# Patient Record
Sex: Female | Born: 1955 | Race: White | Hispanic: No | Smoking: Current every day smoker
Health system: Southern US, Community
[De-identification: ages and names within clinical notes are randomized; demographics above are authoritative.]

## PROBLEM LIST (undated history)

## (undated) DIAGNOSIS — F329 Major depressive disorder, single episode, unspecified: Secondary | ICD-10-CM

## (undated) DIAGNOSIS — B1920 Unspecified viral hepatitis C without hepatic coma: Secondary | ICD-10-CM

## (undated) DIAGNOSIS — F32A Depression, unspecified: Secondary | ICD-10-CM

## (undated) DIAGNOSIS — C55 Malignant neoplasm of uterus, part unspecified: Secondary | ICD-10-CM

## (undated) DIAGNOSIS — T83729A Exposure of other prosthetic materials into organ or tissue, initial encounter: Secondary | ICD-10-CM

## (undated) DIAGNOSIS — I714 Abdominal aortic aneurysm, without rupture: Secondary | ICD-10-CM

## (undated) DIAGNOSIS — F419 Anxiety disorder, unspecified: Secondary | ICD-10-CM

## (undated) DIAGNOSIS — M810 Age-related osteoporosis without current pathological fracture: Secondary | ICD-10-CM

## (undated) DIAGNOSIS — K219 Gastro-esophageal reflux disease without esophagitis: Secondary | ICD-10-CM

## (undated) DIAGNOSIS — J449 Chronic obstructive pulmonary disease, unspecified: Secondary | ICD-10-CM

## (undated) DIAGNOSIS — Z923 Personal history of irradiation: Secondary | ICD-10-CM

## (undated) DIAGNOSIS — I10 Essential (primary) hypertension: Secondary | ICD-10-CM

## (undated) DIAGNOSIS — E559 Vitamin D deficiency, unspecified: Secondary | ICD-10-CM

## (undated) DIAGNOSIS — T83721A Exposure of implanted vaginal mesh and other prosthetic materials into vagina, initial encounter: Secondary | ICD-10-CM

## (undated) DIAGNOSIS — F17200 Nicotine dependence, unspecified, uncomplicated: Secondary | ICD-10-CM

## (undated) DIAGNOSIS — C3490 Malignant neoplasm of unspecified part of unspecified bronchus or lung: Secondary | ICD-10-CM

## (undated) HISTORY — DX: Unspecified viral hepatitis C without hepatic coma: B19.20

## (undated) HISTORY — PX: NEUROPLASTY / TRANSPOSITION MEDIAN NERVE AT CARPAL TUNNEL: SUR893

## (undated) HISTORY — DX: Nicotine dependence, unspecified, uncomplicated: F17.200

## (undated) HISTORY — DX: Essential (primary) hypertension: I10

## (undated) HISTORY — DX: Malignant neoplasm of uterus, part unspecified: C55

## (undated) HISTORY — DX: Depression, unspecified: F32.A

## (undated) HISTORY — DX: Anxiety disorder, unspecified: F41.9

## (undated) HISTORY — PX: TONSILLECTOMY: SUR1361

## (undated) HISTORY — DX: Vitamin D deficiency, unspecified: E55.9

## (undated) HISTORY — DX: Exposure of implanted vaginal mesh into vagina, initial encounter: T83.721A

## (undated) HISTORY — DX: Major depressive disorder, single episode, unspecified: F32.9

## (undated) HISTORY — DX: Gastro-esophageal reflux disease without esophagitis: K21.9

## (undated) HISTORY — DX: Personal history of irradiation: Z92.3

## (undated) HISTORY — DX: Chronic obstructive pulmonary disease, unspecified: J44.9

## (undated) HISTORY — PX: CHOLECYSTECTOMY: SHX55

## (undated) HISTORY — PX: ABDOMINAL HYSTERECTOMY: SHX81

## (undated) HISTORY — DX: Age-related osteoporosis without current pathological fracture: M81.0

## (undated) HISTORY — PX: TUMOR REMOVAL: SHX12

## (undated) HISTORY — PX: TUBAL LIGATION: SHX77

## (undated) HISTORY — DX: Exposure of other prosthetic materials into organ or tissue, initial encounter: T83.729A

## (undated) HISTORY — PX: RECONSTRUCTION OF NOSE: SHX2301

## (undated) HISTORY — PX: BLADDER SURGERY: SHX569

---

## 1999-03-10 ENCOUNTER — Emergency Department (HOSPITAL_COMMUNITY): Admission: EM | Admit: 1999-03-10 | Discharge: 1999-03-10 | Payer: Self-pay | Admitting: Emergency Medicine

## 2005-03-08 ENCOUNTER — Emergency Department: Payer: Self-pay | Admitting: Unknown Physician Specialty

## 2006-06-05 ENCOUNTER — Ambulatory Visit: Payer: Self-pay | Admitting: Gastroenterology

## 2007-04-21 ENCOUNTER — Emergency Department (HOSPITAL_COMMUNITY): Admission: EM | Admit: 2007-04-21 | Discharge: 2007-04-21 | Payer: Self-pay | Admitting: Emergency Medicine

## 2007-05-17 ENCOUNTER — Emergency Department (HOSPITAL_COMMUNITY): Admission: EM | Admit: 2007-05-17 | Discharge: 2007-05-17 | Payer: Self-pay | Admitting: Emergency Medicine

## 2007-09-16 ENCOUNTER — Encounter (HOSPITAL_COMMUNITY): Admission: RE | Admit: 2007-09-16 | Discharge: 2007-10-21 | Payer: Self-pay | Admitting: Nephrology

## 2007-10-04 ENCOUNTER — Emergency Department (HOSPITAL_COMMUNITY): Admission: EM | Admit: 2007-10-04 | Discharge: 2007-10-04 | Payer: Self-pay | Admitting: Emergency Medicine

## 2008-02-06 ENCOUNTER — Encounter: Admission: RE | Admit: 2008-02-06 | Discharge: 2008-02-06 | Payer: Self-pay | Admitting: Nephrology

## 2008-04-06 ENCOUNTER — Ambulatory Visit (HOSPITAL_COMMUNITY): Admission: RE | Admit: 2008-04-06 | Discharge: 2008-04-07 | Payer: Self-pay | Admitting: Obstetrics and Gynecology

## 2008-07-16 ENCOUNTER — Emergency Department (HOSPITAL_COMMUNITY): Admission: EM | Admit: 2008-07-16 | Discharge: 2008-07-16 | Payer: Self-pay | Admitting: Emergency Medicine

## 2009-04-15 ENCOUNTER — Ambulatory Visit: Payer: Self-pay | Admitting: Vascular Surgery

## 2009-04-15 ENCOUNTER — Encounter (INDEPENDENT_AMBULATORY_CARE_PROVIDER_SITE_OTHER): Payer: Self-pay | Admitting: Nephrology

## 2009-04-15 ENCOUNTER — Ambulatory Visit (HOSPITAL_COMMUNITY): Admission: RE | Admit: 2009-04-15 | Discharge: 2009-04-15 | Payer: Self-pay | Admitting: Nephrology

## 2009-06-18 ENCOUNTER — Inpatient Hospital Stay (HOSPITAL_COMMUNITY): Admission: EM | Admit: 2009-06-18 | Discharge: 2009-06-19 | Payer: Self-pay | Admitting: Emergency Medicine

## 2010-01-15 ENCOUNTER — Observation Stay (HOSPITAL_COMMUNITY)
Admission: EM | Admit: 2010-01-15 | Discharge: 2010-01-16 | Payer: Self-pay | Source: Home / Self Care | Admitting: Emergency Medicine

## 2010-01-16 ENCOUNTER — Encounter (INDEPENDENT_AMBULATORY_CARE_PROVIDER_SITE_OTHER): Payer: Self-pay

## 2010-03-12 ENCOUNTER — Encounter: Payer: Self-pay | Admitting: Nephrology

## 2010-03-18 NOTE — Discharge Summary (Signed)
  NAMEROANNE, HAYE               ACCOUNT NO.:  [id number]  MEDICAL RECORD NO.:  [id number]          PATIENT TYPE:  INP  LOCATION:  5114                         FACILITY:  [facility]  PHYSICIAN:  Cherylynn Ridges, M.D.    DATE OF BIRTH:  [birth year]  DATE OF ADMISSION:  01/15/2010 DATE OF DISCHARGE:  01/16/2010                              DISCHARGE SUMMARY   DISCHARGE DIAGNOSES: 1. Acute cholecystitis. 2. Chronic back pain. 3. Chronic obstructive pulmonary disease. 4. Hypertension 5. Anxiety. 6. Hyperlipidemia. 7. Gastroesophageal reflux disease.  PAST SURGERIES: 1. Bladder prolapse repair. 2. Carpal tunnel release.  PROCEDURES DURING THIS ADMISSION:  Laparoscopic cholecystectomy with cholangiogram per Dr. Lindie Spruce on January 16, 2010.  HISTORY:  This is a 55 year old female, who had progressive abdominal pain over the past couple of weeks.  Her pain worsened and she was evaluated in the emergency department where an ultrasound revealed several gallstones within the gallbladder.  She had a positive Murphy sign, and continued abdominal pain as well as some minimally elevated liver functions/transaminases.  The patient was started on IV Invanz in the emergency department, and admitted for anticipated cholecystectomy. The patient was taken to the OR later the same day for laparoscopic cholecystectomy with cholangiogram per Dr. Lindie Spruce with findings of an acutely inflamed and edematous gallbladder, but normal intraoperative cholangiogram.  She did well postoperatively and apparently was discharged home.  Please note that I was not involved in this patient's care and had merely been asked to dictate the summary.  She resumed her usual home medications, and I do not know what prescription she was given for pain control postoperatively.  She will follow up in the [facility] in a couple of weeks after discharge.    Lazaro Arms,  P.A.   ______________________________ Cherylynn Ridges, M.D.   SR/MEDQ  D:  03/01/2010  T:  03/02/2010  Job:  [id number]  Electronically Signed by Lazaro Arms P.A. on 03/02/2010 02:22:01 PM Electronically Signed by Jimmye Norman M.D. on 03/16/2010 08:00:13 AM

## 2010-05-03 LAB — DIFFERENTIAL
Basophils Absolute: 0 10*3/uL (ref 0.0–0.1)
Basophils Relative: 0 % (ref 0–1)
Eosinophils Absolute: 0.1 10*3/uL (ref 0.0–0.7)
Eosinophils Relative: 1 % (ref 0–5)
Lymphocytes Relative: 18 % (ref 12–46)
Monocytes Absolute: 0.7 10*3/uL (ref 0.1–1.0)

## 2010-05-03 LAB — COMPREHENSIVE METABOLIC PANEL
ALT: 50 U/L — ABNORMAL HIGH (ref 0–35)
AST: 91 U/L — ABNORMAL HIGH (ref 0–37)
Albumin: 3.2 g/dL — ABNORMAL LOW (ref 3.5–5.2)
Alkaline Phosphatase: 111 U/L (ref 39–117)
Alkaline Phosphatase: 119 U/L — ABNORMAL HIGH (ref 39–117)
BUN: <1 mg/dL — ABNORMAL LOW (ref 6–23)
Chloride: 102 mEq/L (ref 96–112)
GFR calc Af Amer: >60 mL/min (ref >60)
Glucose, Bld: 110 mg/dL — ABNORMAL HIGH (ref 70–99)
Potassium: 3.2 mEq/L — ABNORMAL LOW (ref 3.5–5.1)
Potassium: 3.5 mEq/L (ref 3.5–5.1)
Total Bilirubin: 0.9 mg/dL (ref 0.3–1.2)
Total Protein: 6.1 g/dL (ref 6.0–8.3)

## 2010-05-03 LAB — URINALYSIS, ROUTINE W REFLEX MICROSCOPIC
Glucose, UA: NEGATIVE mg/dL
Protein, ur: NEGATIVE mg/dL
Specific Gravity, Urine: 1.007 (ref 1.005–1.030)
Urobilinogen, UA: 1 mg/dL (ref 0.0–1.0)

## 2010-05-03 LAB — CBC
Platelets: 210 10*3/uL (ref 150–400)
RBC: 4.42 MIL/uL (ref 3.87–5.11)
WBC: 8.2 10*3/uL (ref 4.0–10.5)

## 2010-05-03 LAB — URINE MICROSCOPIC-ADD ON

## 2010-05-03 LAB — URINE CULTURE: Colony Count: 25000

## 2010-05-10 LAB — COMPREHENSIVE METABOLIC PANEL
Albumin: 3.3 g/dL — ABNORMAL LOW (ref 3.5–5.2)
BUN: 6 mg/dL (ref 6–23)
CO2: 25 mEq/L (ref 19–32)
CO2: 27 mEq/L (ref 19–32)
Calcium: 8.5 mg/dL (ref 8.4–10.5)
Calcium: 9.3 mg/dL (ref 8.4–10.5)
Chloride: 106 mEq/L (ref 96–112)
Creatinine, Ser: 0.6 mg/dL (ref 0.4–1.2)
Creatinine, Ser: 0.65 mg/dL (ref 0.4–1.2)
GFR calc non Af Amer: >60 mL/min (ref >60)
GFR calc non Af Amer: >60 mL/min (ref >60)
Glucose, Bld: 94 mg/dL (ref 70–99)
Sodium: 138 mEq/L (ref 135–145)
Total Bilirubin: 0.4 mg/dL (ref 0.3–1.2)
Total Protein: 7.8 g/dL (ref 6.0–8.3)

## 2010-05-10 LAB — DIFFERENTIAL
Eosinophils Absolute: 0.1 10*3/uL (ref 0.0–0.7)
Eosinophils Relative: 1 % (ref 0–5)
Lymphocytes Relative: 38 % (ref 12–46)
Lymphs Abs: 2.9 10*3/uL (ref 0.7–4.0)
Lymphs Abs: 3.3 10*3/uL (ref 0.7–4.0)
Monocytes Absolute: 0.4 10*3/uL (ref 0.1–1.0)
Monocytes Relative: 6 % (ref 3–12)
Monocytes Relative: 6 % (ref 3–12)
Neutro Abs: 4.7 10*3/uL (ref 1.7–7.7)
Neutrophils Relative %: 55 % (ref 43–77)

## 2010-05-10 LAB — GLUCOSE, CAPILLARY
Glucose-Capillary: 106 mg/dL — ABNORMAL HIGH (ref 70–99)
Glucose-Capillary: 130 mg/dL — ABNORMAL HIGH (ref 70–99)
Glucose-Capillary: 142 mg/dL — ABNORMAL HIGH (ref 70–99)
Glucose-Capillary: 86 mg/dL (ref 70–99)

## 2010-05-10 LAB — SALICYLATE LEVEL: Salicylate Lvl: <4 mg/dL (ref 2.8–20.0)

## 2010-05-10 LAB — RENAL FUNCTION PANEL
BUN: 3 mg/dL — ABNORMAL LOW (ref 6–23)
CO2: 27 mEq/L (ref 19–32)
Calcium: 9.1 mg/dL (ref 8.4–10.5)
Creatinine, Ser: 0.59 mg/dL (ref 0.4–1.2)
Glucose, Bld: 102 mg/dL — ABNORMAL HIGH (ref 70–99)

## 2010-05-10 LAB — CARDIAC PANEL(CRET KIN+CKTOT+MB+TROPI): Troponin I: 0.01 ng/mL (ref 0.00–0.06)

## 2010-05-10 LAB — URINALYSIS, ROUTINE W REFLEX MICROSCOPIC
Glucose, UA: NEGATIVE mg/dL
Protein, ur: NEGATIVE mg/dL
Urobilinogen, UA: 1 mg/dL (ref 0.0–1.0)

## 2010-05-10 LAB — CBC
HCT: 40.1 % (ref 36.0–46.0)
HCT: 40.7 % (ref 36.0–46.0)
MCHC: 34.1 g/dL (ref 30.0–36.0)
MCHC: 34.2 g/dL (ref 30.0–36.0)
MCV: 93.7 fL (ref 78.0–100.0)
MCV: 94.5 fL (ref 78.0–100.0)
Platelets: 295 10*3/uL (ref 150–400)
Platelets: 328 10*3/uL (ref 150–400)
RBC: 4.31 MIL/uL (ref 3.87–5.11)
RDW: 13.3 % (ref 11.5–15.5)
WBC: 7.1 10*3/uL (ref 4.0–10.5)
WBC: 8.7 10*3/uL (ref 4.0–10.5)

## 2010-05-10 LAB — TROPONIN I: Troponin I: 0.02 ng/mL (ref 0.00–0.06)

## 2010-05-10 LAB — APTT: aPTT: 37 seconds (ref 24–37)

## 2010-05-10 LAB — RAPID URINE DRUG SCREEN, HOSP PERFORMED
Amphetamines: NOT DETECTED
Benzodiazepines: POSITIVE — AB
Cocaine: NOT DETECTED
Tetrahydrocannabinol: NOT DETECTED

## 2010-05-10 LAB — ACETAMINOPHEN LEVEL: Acetaminophen (Tylenol), Serum: <10 ug/mL — ABNORMAL LOW (ref 10–30)

## 2010-05-10 LAB — POCT CARDIAC MARKERS: Myoglobin, poc: 64.3 ng/mL (ref 12–200)

## 2010-05-10 LAB — PROTIME-INR
INR: 1.05 (ref 0.00–1.49)
Prothrombin Time: 13.6 seconds (ref 11.6–15.2)

## 2010-05-10 LAB — CK TOTAL AND CKMB (NOT AT ARMC): CK, MB: 0.6 ng/mL (ref 0.3–4.0)

## 2010-05-10 LAB — URINE CULTURE: Culture: NO GROWTH

## 2010-06-07 LAB — CBC
HCT: 34.8 % — ABNORMAL LOW (ref 36.0–46.0)
Hemoglobin: 15.8 g/dL — ABNORMAL HIGH (ref 12.0–15.0)
MCHC: 34.1 g/dL (ref 30.0–36.0)
Platelets: 183 10*3/uL (ref 150–400)
RBC: 5.02 MIL/uL (ref 3.87–5.11)
WBC: 8.3 10*3/uL (ref 4.0–10.5)

## 2010-06-07 LAB — BASIC METABOLIC PANEL
BUN: 2 mg/dL — ABNORMAL LOW (ref 6–23)
CO2: 26 mEq/L (ref 19–32)
Calcium: 9.8 mg/dL (ref 8.4–10.5)
GFR calc Af Amer: >60 mL/min (ref >60)
GFR calc non Af Amer: >60 mL/min (ref >60)
Potassium: 3.3 mEq/L — ABNORMAL LOW (ref 3.5–5.1)
Sodium: 139 mEq/L (ref 135–145)

## 2010-07-05 NOTE — H&P (Signed)
[name], [name] NO.:  [id number]   MEDICAL RECORD NO.:  [id number]          PATIENT TYPE:  AMB   LOCATION:  SDC                           FACILITY:  [facility]   PHYSICIAN:  Osborn Coho, M.D.   DATE OF BIRTH:  [birth year]   DATE OF ADMISSION:  DATE OF DISCHARGE:                              HISTORY & PHYSICAL   HISTORY OF PRESENT ILLNESS:  [name] is a 54 year old divorced white  female para 2-0-1-2 who is status post total abdominal hysterectomy with  left salpingo-oophorectomy presenting for placement of tension-free  vaginal tape and anterior-posterior colporrhaphy because of mixed  urinary incontinence.  The patient presented to [facility] OB[facility]  August 2009 with complaints of having her bladder drop.  This report  was accompanied by the patient complaining of pelvic pressure, urinary  urgency, and a occasional incontinence.  She denies any frequent urinary  tract symptoms, flank pain, fever, nausea, vomiting, diarrhea, or  vaginitis symptoms, but did complain of deep dyspareunia and  constipation.  The patient underwent cystometrics in October 2009, which  revealed mixed urinary incontinence with incomplete bladder emptying.  The patient was given a review of both medical and surgical management  options for her symptoms and she decided to proceed with surgical  intervention.  A medical clearance was obtained from the patient's  primary care physician, Dr. Jeri Cos.   OB HISTORY:  Gravida 3, para 2-0-1-2.   GYN HISTORY:  Menarche at 55 years old.  The patient has undergone a  hysterectomy.  She denies any history of sexually transmitted diseases  or abnormal Pap smears.  Her last normal Pap smear and mammogram were  both done in August 2009.   PAST MEDICAL HISTORY:  Hypercholesterolemia, hypertension, chronic back  pain, degenerative disk disease in the neck, osteoporosis, uterine  cancer, and migraines.   SURGICAL HISTORY:  Right  carpal tunnel surgery on 2 separate occasions.  Nasal surgery due to cancer, tonsillectomy and hysterectomy with left  salpingo-oophorectomy per patient due to uterine cancer.  The patient  did have colon surgery.   FAMILY HISTORY:  Cardiovascular disease, asthma, thyroid disease, breast  cancer (mother), non-Hodgkin lymphoma, liver disease, irritable bowel  syndrome, hypertension, diabetes, migraines, stroke, and hepatitis.   HABITS:  The patient smokes a quarter pack of cigarettes per day.  She  denies any alcohol or illicit drug use.   SOCIAL HISTORY:  The patient lives with her ex-husband and is currently  unemployed.   MEDICATIONS:  1. Zoloft.  2. Advair.  3. Crestor.  4. Albuterol.  5. Micardis.  6. Valium.  7. Percocet.   ALLERGIES:  The patient is allergic to NONSTEROIDAL ANTI-INFLAMMATORY  MEDICATIONS, all of which cause her to swell.  She further denies any  allergy to LATEX or to [facility].   REVIEW OF SYSTEMS:  The patient denies chest pain, shortness of breath,  nausea, vomiting, or diarrhea.  The patient denies chest pain, shortness  of breath, fever, recent weight loss, night sweats, headache, vision  changes, nausea, vomiting, diarrhea, fever, pelvic pain, vaginitis  symptoms and except as  is mentioned in the history present illness the  patient's review of systems is otherwise negative.   PHYSICAL EXAMINATION:  VITAL SIGNS:  Blood pressure is 120/80, weight is  153.  EAR, NOSE AND THROAT:  Pupils are equal.  Hearing is normal.  Throat is  clear.  NECK:  Supple without masses.  There is no thyromegaly or cervical  adenopathy.  HEART:  Regular rate and rhythm.  LUNGS:  Reveal clear, but coarse breath sounds.  BACK:  No CVA tenderness.  ABDOMEN:  No tenderness, masses, or organomegaly.  EXTREMITIES:  No clubbing, cyanosis, or edema.  PELVIC:  EGBUS is within normal limits.  Vagina is normal.  Uterus and  cervix are surgically absent.  Adnexa, no tenderness  or masses.  Do  note, the patient does have a grade 2/4 cystocele and grade 4/4  rectocele.   IMPRESSION:  Mixed urinary incontinence.   DISPOSITION:  A discussion was held with the patient regarding the  indications for her procedures along with their risks, which include,  but are not limited to reaction to anesthesia, damage to adjacent  organs, infection, or excessive bleeding.  The possibility of erosion of  tension-free vaginal tape and the possibility of worsening of her  symptoms.  The patient verbalized understanding of these risk and has  consented to proceed with placement of tension-free vaginal tape along  with anterior-posterior colporrhaphy at [facility] on  April 06, 2008 at 9:30 a.m.      [name].      Osborn Coho, M.D.  Electronically Signed    EJP/MEDQ  D:  04/03/2008  T:  04/04/2008  Job:  [id number]

## 2010-07-05 NOTE — Op Note (Signed)
[name], [name]               ACCOUNT NO.:  [id number]   MEDICAL RECORD NO.:  [id number]          PATIENT TYPE:  AMB   LOCATION:  SDC                           FACILITY:  [facility]   PHYSICIAN:  Osborn Coho, M.D.   DATE OF BIRTH:  [birth year]   DATE OF PROCEDURE:  04/06/2008  DATE OF DISCHARGE:                               OPERATIVE REPORT   PREOPERATIVE DIAGNOSES:  1. Urinary incontinence.  2. Cystocele.  3. Rectocele.   POSTOPERATIVE DIAGNOSES:  1. Urinary incontinence.  2. Cystocele.  3. Rectocele.   PROCEDURES:  1. Anterior repair.  2. Posterior repair with Gynemesh.  3. Tension-free vaginal tape.  4. Cystoscopy.   ATTENDING SURGEON:  Osborn Coho, MD   ASSISTANT:  Marquis Lunch. Lowell Guitar, PA-C   ANESTHESIA:  Epidural.   FLUIDS:  1500 mL.   URINE OUTPUT:  600 mL.   ESTIMATED BLOOD LOSS:  100 mL.   COMPLICATIONS:  None.   PROCEDURE:  The patient was taken to the operating room after risks,  benefits and alternatives discussed with the patient.  The patient  verbalized understanding and consent signed and witnessed.  The patient  was given an epidural per Anesthesia and prepped and draped in normal  sterile fashion in the dorsal lithotomy position.  Dilute Pitressin was  injected in the anterior vaginal wall for a total of approximately 50 mL  and the vaginal wall dissected away from the underlying tissue up to the  level of the mid urethra.  Two incisions were made on the mons pubis 2  fingerbreadths from the midline at the upper edge of the symphysis  pubis.  The bladder was drained completely and the of 18-French catheter  with the rigid urethral catheter guide was placed in the urethra and  deflected to the patient's right.  The transabdominal guide was passed  through the incision on the mons pubis on the ipsilateral side down to  the space of Retzius and out to the anterior vaginal wall.  The same was  done on the contralateral side.  Cystoscopy was  performed and no  inadvertent bladder injury was noted.  The bladder was then drained  completely and the Foley catheter with the rigid urethral catheter guide  was then placed in the bladder and deflected to the patient's right and  the mesh inserted onto the transabdominal guide on the ipsilateral side  and elevated up through the space of Retzius and out through the  incision on the mons pubis.  The same was done on the contralateral  side.  Cystoscopy was performed once again and no inadvertent bladder  injury was noted and bilateral ureters were noted to efflux without  difficulty.  While holding a Kelly between the mesh and the mid urethra  in order to keep it slack, the sheath was removed while the Foley was in  the urethra placed to gravity on the Foley bag.  The cystocele was  repaired with 4 Kelly plication stitches of 2-0 Vicryl and the anterior  vaginal wall was repaired via 2-0 Vicryl via interrupted stitches.  The  mesh  was cut flush with the skin on the mons pubis and the incisions  were repaired with Dermabond.  Attention was then turned to the  posterior vaginal wall which was injected with dilute Pitressin and a  total of about 30 mL was used.  The posterior vaginal wall was dissected  away from the underlying tissue and several plication stitches of 3-0  Vicryl were placed.  The mesh was then measured to be placed in the  space which measured 3 x 2.5 cm.  The wings were placed on top and the  mesh was then placed in the space and sutured down with 2-0 Vicryl via 4  interrupted sutures, 1 on the top, 1 on the bottom, and 1 on each side.  The posterior vaginal wall was then repaired with interrupted stitches  of 2-0 Vicryl and the vagina was packed with 2-inch packing soaked with  estrogen cream.  Sponge, lap, and needle count was correct.  The patient  tolerated procedure well and was awaiting transfer to the recovery room  in good condition.      Osborn Coho,  M.D.  Electronically Signed     AR/MEDQ  D:  04/06/2008  T:  04/07/2008  Job:  [id number]

## 2010-07-08 NOTE — Discharge Summary (Signed)
[name], [name]               ACCOUNT NO.:  [id number]   MEDICAL RECORD NO.:  [id number]          PATIENT TYPE:  OIB   LOCATION:  9317                          FACILITY:  [facility]   PHYSICIAN:  Osborn Coho, M.D.   DATE OF BIRTH:  October 17, 1955   DATE OF ADMISSION:  04/06/2008  DATE OF DISCHARGE:  04/07/2008                               DISCHARGE SUMMARY   DISCHARGE DIAGNOSIS:  Mixed urinary incontinence.   OPERATION:  On the date of admission, the patient underwent an anterior-  posterior colporrhaphy with placement of Gynemesh on the posterior  aspect along with placement of tension-free vaginal tape and cystoscopy,  tolerating all procedures well.   HISTORY OF PRESENT ILLNESS:  [name] is a 55 year old divorced white  female, para 2-0-1-2, who presents status post total abdominal  hysterectomy with left salpingo-oophorectomy, coming for placement of  tension-free vaginal tape, an anterior-posterior colporrhaphy because of  mixed urinary incontinence.  Please see the patient's dictated history  and physical examination for details.   PREOPERATIVE PHYSICAL EXAM:  VITAL SIGNS:  Blood pressure 120/80 and  weight 153.  GENERAL:  Within normal limits.  PELVIC:  EGBUS was within normal limits.  Vagina was normal.  Uterus and  cervix were surgically absent.  Adnexa did not reveal any tenderness or  masses.  However, do note that the patient did have a 2/4 cystocele and  grade 4/4 rectocele.   HOSPITAL COURSE:  On the day of admission, the patient underwent  aforementioned procedures, tolerating them all well.  Postoperative  course was unremarkable with the patient resuming bowel and bladder  function.  By postop day #1, tolerating a postop hemoglobin of 12.0  (preop hemoglobin 15.8) and therefore deemed ready for discharge home.   DISCHARGE MEDICATIONS:  The patient was directed to her home medication  reconciliation form.  She was further prescribed;  1. Percocet 5/325  one-two tablets every 4 hours as needed for pain.  2. Colace 100 mg two-three times daily until her bowel movements are      regular.  3. Cipro 250 mg twice daily for 5 days.   FOLLOWUP:  The patient has a followup appointment at [facility]  OB/GYN with [facility], [facility] on April 16, 2008 at 2:45 p.m.  She  has a 6 weeks postoperative visit with Dr. Su Hilt on May 18, 2008 at  2 o'clock p.m.   DISCHARGE INSTRUCTIONS:  The patient was advised to call for any  temperature greater than or equal to 100.4 degrees Fahrenheit orally,  problems with urination, vomiting, or excessive bleeding.  She was also  advised that if she does not have a bowel movement in 3 days then she  may take a laxative of her choice.  She was further advised  to not drive for 1 week or while she is taking narcotics, no heavy  lifting over 10 pounds for 6 weeks, no intercourse for 6 weeks that she  may shower.  She may walk up steps.  The patient's diet is that of a low  sodium diet.  Wound care is not applicable  and pathology is not  applicable.       [name].      Osborn Coho, M.D.  Electronically Signed    EJP/MEDQ  D:  04/27/2008  T:  04/28/2008  Job:  [id number]

## 2010-12-04 ENCOUNTER — Emergency Department (HOSPITAL_COMMUNITY): Payer: Medicaid Other

## 2010-12-04 ENCOUNTER — Emergency Department (HOSPITAL_COMMUNITY)
Admission: EM | Admit: 2010-12-04 | Discharge: 2010-12-05 | Disposition: A | Discharge status: Home / Self Care | Payer: Medicaid Other | Attending: Emergency Medicine | Admitting: Emergency Medicine

## 2010-12-04 DIAGNOSIS — R059 Cough, unspecified: Secondary | ICD-10-CM | POA: Insufficient documentation

## 2010-12-04 DIAGNOSIS — R079 Chest pain, unspecified: Secondary | ICD-10-CM | POA: Insufficient documentation

## 2010-12-04 DIAGNOSIS — R112 Nausea with vomiting, unspecified: Secondary | ICD-10-CM | POA: Insufficient documentation

## 2010-12-04 DIAGNOSIS — J449 Chronic obstructive pulmonary disease, unspecified: Secondary | ICD-10-CM | POA: Insufficient documentation

## 2010-12-04 DIAGNOSIS — R05 Cough: Secondary | ICD-10-CM | POA: Insufficient documentation

## 2010-12-04 DIAGNOSIS — R634 Abnormal weight loss: Secondary | ICD-10-CM | POA: Insufficient documentation

## 2010-12-04 DIAGNOSIS — R5381 Other malaise: Secondary | ICD-10-CM | POA: Insufficient documentation

## 2010-12-04 DIAGNOSIS — M545 Low back pain, unspecified: Secondary | ICD-10-CM | POA: Insufficient documentation

## 2010-12-04 DIAGNOSIS — I10 Essential (primary) hypertension: Secondary | ICD-10-CM | POA: Insufficient documentation

## 2010-12-04 DIAGNOSIS — F17200 Nicotine dependence, unspecified, uncomplicated: Secondary | ICD-10-CM | POA: Insufficient documentation

## 2010-12-04 DIAGNOSIS — R5383 Other fatigue: Secondary | ICD-10-CM | POA: Insufficient documentation

## 2010-12-04 DIAGNOSIS — R42 Dizziness and giddiness: Secondary | ICD-10-CM | POA: Insufficient documentation

## 2010-12-04 DIAGNOSIS — J4489 Other specified chronic obstructive pulmonary disease: Secondary | ICD-10-CM | POA: Insufficient documentation

## 2010-12-04 LAB — COMPREHENSIVE METABOLIC PANEL
ALT: 16 U/L (ref 0–35)
AST: 24 U/L (ref 0–37)
Alkaline Phosphatase: 78 U/L (ref 39–117)
CO2: 19 mEq/L (ref 19–32)
Chloride: 108 mEq/L (ref 96–112)
Creatinine, Ser: 0.74 mg/dL (ref 0.50–1.10)
GFR calc non Af Amer: >90 mL/min (ref >90)
Sodium: 140 mEq/L (ref 135–145)
Total Bilirubin: 0.1 mg/dL — ABNORMAL LOW (ref 0.3–1.2)

## 2010-12-04 LAB — DIFFERENTIAL
Basophils Absolute: 0.1 10*3/uL (ref 0.0–0.1)
Basophils Relative: 1 % (ref 0–1)
Eosinophils Relative: 2 % (ref 0–5)
Monocytes Absolute: 0.4 10*3/uL (ref 0.1–1.0)

## 2010-12-04 LAB — CBC
MCHC: 35.1 g/dL (ref 30.0–36.0)
RDW: 13.7 % (ref 11.5–15.5)

## 2010-12-05 LAB — URINE MICROSCOPIC-ADD ON

## 2010-12-05 LAB — URINALYSIS, ROUTINE W REFLEX MICROSCOPIC
Hgb urine dipstick: NEGATIVE
Ketones, ur: NEGATIVE mg/dL
Protein, ur: NEGATIVE mg/dL
Urobilinogen, UA: 0.2 mg/dL (ref 0.0–1.0)

## 2012-03-01 ENCOUNTER — Ambulatory Visit
Admission: RE | Admit: 2012-03-01 | Discharge: 2012-03-01 | Disposition: A | Discharge status: Home / Self Care | Payer: Medicaid Other | Source: Ambulatory Visit | Attending: Nephrology | Admitting: Nephrology

## 2012-03-01 ENCOUNTER — Other Ambulatory Visit: Payer: Self-pay | Admitting: Nephrology

## 2012-03-01 DIAGNOSIS — R05 Cough: Secondary | ICD-10-CM

## 2012-03-01 DIAGNOSIS — R0602 Shortness of breath: Secondary | ICD-10-CM

## 2012-03-28 ENCOUNTER — Encounter (HOSPITAL_COMMUNITY): Payer: Self-pay

## 2012-03-28 ENCOUNTER — Emergency Department (HOSPITAL_COMMUNITY)
Admission: EM | Admit: 2012-03-28 | Discharge: 2012-03-28 | Disposition: A | Discharge status: Home / Self Care | Payer: Medicaid Other | Source: Home / Self Care | Attending: Emergency Medicine | Admitting: Emergency Medicine

## 2012-03-28 NOTE — ED Provider Notes (Signed)
History   CSN: [id number]  Arrival date & time 03/28/12  1418  First MD Initiated Contact with Patient 03/28/12 1421     Chief Complaint  Patient presents with  . Medication Refill   The history is provided by the patient, the spouse and a relative. No language interpreter was used.   The patient presented today requesting refills of all of her controlled substances medications including oxycodone, tylenol #4, xanax and valium.  She says that she still has some of her xanax and valium left but she went to see her PCP Dr Bascom Levels today and he told her that he was told by the [facility] that he can no longer prescribe controlled substances.   He told her to come to the urgent care to be seen.  The patient brought over some medical records from her PCP office.  She says that she still plans to continue to go to Dr. Bascom Levels for her primary care.  I tried to review the records that patient brought with her but they were handwritten records that were very difficult to read.  The information that was in the records indicate that for 2 visits in Jan 2014 pt had received tylenol #4.  She had also received 120 tablets of oxycodone.  She was also receiving xanax.  The patient also told me that she takes valium and xanax at the same time.  I spoke briefly with Dr. Bascom Levels on the phone and he confirmed that he was seeing the patient and that he was not going to write anymore controlled substances for the patient.  He would not tell me directly why he would not prescribe anymore controlled substances.  I had a long conversation with the patient and her daughter and I offered to assist the patient with being detoxed from these high risk medications.  I told them that I did not feel comfortable writing for these medications with poor medical records and because the patient's history is not matching with what I can decipher from the medical records.  I couldn't find valium in the medical records.  The patient became angry and  refused to accept being weaned and detoxed from these medications.  I spoke with our office manager and we made arrangements for patient to be accepted into the [facility] outpatient detox program for a 10 day suboxone detox and 4 week recovery program.  They were willing to take the patient tomorrow.  The patient angrily refused.  I recommended that the patient be taken over to the ER for detox and she became angry.  I explained to her that she needed to be weaned off opioids and benzodiazepines.  She got up from her chair and then she violently snatched the medical record papers from my hand shoving me and starting shouting expletives directed at me as she stormed out of the door.  She then got lost in the clinic and couldn't find the exit.  She went back into the area of the administrative offices.  Her husband who was in the room continued to shout expletives at me as well as he followed the patient out into the hallway.  They proceeded to leave  in anger and they left the building.  I spent 48 mins trying to get a detailed history, review medical records, investigate the situation and formulate a care plan,etc.   History reviewed. No pertinent past medical history.  Past Surgical History  Procedure Date  . Cholecystectomy     No family history  on file.  History  Substance Use Topics  . Smoking status: Not on file  . Smokeless tobacco: Not on file  . Alcohol Use:     OB History    Grav Para Term Preterm Abortions TAB SAB Ect Mult Living                  Review of Systems  Allergies  Nsaids  Home Medications  No current outpatient prescriptions on file.  BP 134/72  Pulse 81  Temp 97.6 F (36.4 C) (Oral)  Resp 15  SpO2 100%  Physical Exam  ED Course  Procedures (including critical care time)  Labs Reviewed - No data to display No results found.  No diagnosis found.  MDM  IMPRESSION  Opioid dependence  Benzodiazepine dependence  Chronic active nicotine  use  High risk medications  RECOMMENDATIONS / PLAN I recommended that the patient go to [facility] outpatient 10 day detox program. We called them and they agreed to see the patient on 2/7 but patient and her daughter and husband refused.    The patient was given clear instructions to go to ER for detox.  The patient verbalized understanding.            Rachael Fleet, MD 03/29/12 [phone]

## 2012-03-28 NOTE — ED Notes (Signed)
Patient has abd issues since remover of her gall bladder.   States her primary dr office had DEA in recently and will no longer prescribe her pain medication Back problems

## 2012-10-04 ENCOUNTER — Ambulatory Visit: Payer: Self-pay | Admitting: Physician Assistant

## 2014-08-07 ENCOUNTER — Encounter: Payer: Self-pay | Admitting: Urgent Care

## 2014-08-07 ENCOUNTER — Ambulatory Visit: Payer: Self-pay | Admitting: Urgent Care

## 2014-08-11 ENCOUNTER — Ambulatory Visit: Payer: Self-pay | Admitting: Urgent Care

## 2014-08-18 ENCOUNTER — Ambulatory Visit: Payer: Self-pay | Admitting: Urgent Care

## 2014-09-02 ENCOUNTER — Ambulatory Visit: Payer: Medicaid Other | Admitting: Gastroenterology

## 2014-09-28 ENCOUNTER — Ambulatory Visit (INDEPENDENT_AMBULATORY_CARE_PROVIDER_SITE_OTHER): Payer: Medicaid Other | Admitting: Gastroenterology

## 2014-09-28 ENCOUNTER — Encounter (INDEPENDENT_AMBULATORY_CARE_PROVIDER_SITE_OTHER): Payer: Self-pay

## 2014-09-28 VITALS — BP 111/58 | HR 74 | Temp 98.0°F | Ht 68.0 in | Wt 115.0 lb

## 2014-09-28 DIAGNOSIS — G4700 Insomnia, unspecified: Secondary | ICD-10-CM | POA: Insufficient documentation

## 2014-09-28 DIAGNOSIS — F32A Depression, unspecified: Secondary | ICD-10-CM | POA: Insufficient documentation

## 2014-09-28 DIAGNOSIS — F329 Major depressive disorder, single episode, unspecified: Secondary | ICD-10-CM | POA: Insufficient documentation

## 2014-09-28 DIAGNOSIS — J449 Chronic obstructive pulmonary disease, unspecified: Secondary | ICD-10-CM | POA: Insufficient documentation

## 2014-09-28 DIAGNOSIS — M545 Low back pain, unspecified: Secondary | ICD-10-CM | POA: Insufficient documentation

## 2014-09-28 DIAGNOSIS — E559 Vitamin D deficiency, unspecified: Secondary | ICD-10-CM | POA: Insufficient documentation

## 2014-09-28 DIAGNOSIS — R894 Abnormal immunological findings in specimens from other organs, systems and tissues: Secondary | ICD-10-CM

## 2014-09-28 DIAGNOSIS — R768 Other specified abnormal immunological findings in serum: Secondary | ICD-10-CM

## 2014-09-28 DIAGNOSIS — M542 Cervicalgia: Secondary | ICD-10-CM | POA: Insufficient documentation

## 2014-09-28 DIAGNOSIS — M810 Age-related osteoporosis without current pathological fracture: Secondary | ICD-10-CM | POA: Insufficient documentation

## 2014-09-28 DIAGNOSIS — F411 Generalized anxiety disorder: Secondary | ICD-10-CM | POA: Insufficient documentation

## 2014-09-28 DIAGNOSIS — J45909 Unspecified asthma, uncomplicated: Secondary | ICD-10-CM | POA: Insufficient documentation

## 2014-09-28 NOTE — Progress Notes (Signed)
Gastroenterology Consultation  Referring Provider:     No ref. provider found Primary Care Physician:  Lorelee Market, MD Primary Gastroenterologist:  Dr. Allen Norris     Reason for Consultation:     Hepatitis C        HPI:   [name] is a 59 y.o. y/o female referred for consultation & management of hepatitis C by Dr. Brunetta Genera, Sabino Gasser, MD.  This patient comes here today with her ex-husband for evaluation of the hepatitis C antibody positive. The patient's liver enzymes have been normal. The patient denies any high risk activity except exposure to her husband. The patient reports that she is not even sex reactive with her ex-husband who is with her today. The patient's ex-husband was incarcerated for 15 years and was aware of his hepatitis C status since then. He has never been treated for his hepatitis C and she states she was tested multiple times and reports she was negative. The patient states she has multiple medical problems including a large aneurysm of her aorta. She also smokes heavily.  Past Medical History  Diagnosis Date  . Anxiety   . Chronic obstructive pulmonary disease   . Depression   . Nicotine dependence   . Vitamin D deficiency   . Age-related osteoporosis without current pathological fracture   . Viral hepatitis C   . Exposure of implanted vaginal mesh and prosthetic material in vagina   . Hypertension   . GERD (gastroesophageal reflux disease)   . Uterus cancer     Past Surgical History  Procedure Laterality Date  . Cholecystectomy    . Tubal ligation    . Tonsillectomy    . Abdominal hysterectomy    . Bladder surgery    . Tumor removal      Nose  . Reconstruction of nose    . Neuroplasty / transposition median nerve at carpal tunnel      Prior to Admission medications   Medication Sig Start Date End Date Taking? Authorizing Provider  albuterol (PROVENTIL HFA;VENTOLIN HFA) 108 (90 BASE) MCG/ACT inhaler Inhale 1 puff into the lungs every 6 (six)  hours as needed for wheezing or shortness of breath.   Yes Historical Provider, MD  ALPRAZolam Duanne Moron) 1 MG tablet Take 1 mg by mouth at bedtime as needed for anxiety.   Yes Historical Provider, MD  Calcium Carbonate (CALTRATE 600 PO) Take 1 tablet by mouth daily.   Yes Historical Provider, MD  omeprazole (PRILOSEC) 20 MG capsule Take 20 mg by mouth daily.   Yes Historical Provider, MD  ondansetron (ZOFRAN) 4 MG tablet Take 4 mg by mouth every 8 (eight) hours as needed for nausea or vomiting.   Yes Historical Provider, MD  Oxycodone HCl 10 MG TABS Take 10 mg by mouth daily.   Yes Historical Provider, MD  tiotropium (SPIRIVA) 18 MCG inhalation capsule Place 18 mcg into inhaler and inhale daily.   Yes Historical Provider, MD  alendronate (FOSAMAX) 70 MG tablet Take 70 mg by mouth once a week. Take with a full glass of water on an empty stomach.    Historical Provider, MD  clopidogrel (PLAVIX) 75 MG tablet Take 75 mg by mouth daily.    Historical Provider, MD  simvastatin (ZOCOR) 20 MG tablet Take 20 mg by mouth daily.    Historical Provider, MD  Vitamin D, Ergocalciferol, (DRISDOL) 50000 UNITS CAPS capsule Take 50,000 Units by mouth every 7 (seven) days.    Historical Provider, MD  Family History  Problem Relation Age of Onset  . Diabetes Mother   . Liver cancer Brother   . Breast cancer Mother      History  Substance Use Topics  . Smoking status: Current Every Day Smoker  . Smokeless tobacco: Never Used  . Alcohol Use: No    Allergies as of 09/28/2014 - Review Complete 09/28/2014  Allergen Reaction Noted  . Mucinex [guaifenesin er]  08/07/2014  . Nsaids  03/28/2012    Review of Systems:    All systems reviewed and negative except where noted in HPI.   Physical Exam:  BP 111/58 mmHg  Pulse 74  Temp(Src) 98 F (36.7 C) (Oral)  Ht '5\' 8"'$  (1.727 m)  Wt 115 lb (52.164 kg)  BMI 17.49 kg/m2 No LMP recorded. Patient is postmenopausal. Psych:  Alert and cooperative. Normal mood  and affect. General:   Alert,  Well-developed, well-nourished, pleasant and cooperative in NAD Head:  Normocephalic and atraumatic. Eyes:  Sclera clear, no icterus.   Conjunctiva pink. Ears:  Normal auditory acuity. Nose:  No deformity, discharge, or lesions. Mouth:  No deformity or lesions,oropharynx pink & moist. Neck:  Supple; no masses or thyromegaly. Lungs:  Respirations even and unlabored.  Clear throughout to auscultation.   No wheezes, crackles, or rhonchi. No acute distress. Heart:  Regular rate and rhythm; no murmurs, clicks, rubs, or gallops. Abdomen:  Normal bowel sounds.  No bruits.  Soft, non-tender and non-distended without masses, hepatosplenomegaly or hernias noted.  No guarding or rebound tenderness.  Negative Carnett sign.   Rectal:  Deferred.  Msk:  Symmetrical without gross deformities.  Good, equal movement & strength bilaterally. Pulses:  Normal pulses noted. Extremities:  No clubbing or edema.  No cyanosis. Neurologic:  Alert and oriented x3;  grossly normal neurologically. Skin:  Intact without significant lesions or rashes.  No jaundice. Lymph Nodes:  No significant cervical adenopathy. Psych:  Alert and cooperative. Normal mood and affect.  Imaging Studies: No results found.  Assessment and Plan:   [name] is a 59 y.o. y/o female who comes in today with a positive antibody for hepatitis C. The patient has normal liver enzymes. The patient has he only risk factor of her ex-husband being positive for hepatitis C. The patient will have a workup for her hepatitis C. The patient will be treated according to her test results.  Note: This dictation was prepared with Dragon dictation along with smaller phrase technology. Any transcriptional errors that result from this process are unintentional.

## 2014-10-27 ENCOUNTER — Ambulatory Visit: Admission: RE | Admit: 2014-10-27 | Payer: Medicaid Other | Source: Ambulatory Visit

## 2015-02-17 ENCOUNTER — Inpatient Hospital Stay (HOSPITAL_COMMUNITY)
Admission: EM | Admit: 2015-02-17 | Discharge: 2015-02-26 | DRG: 871 | Disposition: A | Discharge status: Home Health | Payer: Medicaid Other | Attending: Internal Medicine | Admitting: Internal Medicine

## 2015-02-17 ENCOUNTER — Encounter (HOSPITAL_COMMUNITY): Payer: Self-pay | Admitting: Emergency Medicine

## 2015-02-17 ENCOUNTER — Emergency Department (HOSPITAL_COMMUNITY): Payer: Medicaid Other

## 2015-02-17 DIAGNOSIS — B001 Herpesviral vesicular dermatitis: Secondary | ICD-10-CM | POA: Diagnosis present

## 2015-02-17 DIAGNOSIS — D509 Iron deficiency anemia, unspecified: Secondary | ICD-10-CM | POA: Diagnosis present

## 2015-02-17 DIAGNOSIS — B182 Chronic viral hepatitis C: Secondary | ICD-10-CM | POA: Diagnosis present

## 2015-02-17 DIAGNOSIS — J9601 Acute respiratory failure with hypoxia: Secondary | ICD-10-CM

## 2015-02-17 DIAGNOSIS — I714 Abdominal aortic aneurysm, without rupture, unspecified: Secondary | ICD-10-CM

## 2015-02-17 DIAGNOSIS — Z7982 Long term (current) use of aspirin: Secondary | ICD-10-CM

## 2015-02-17 DIAGNOSIS — J189 Pneumonia, unspecified organism: Secondary | ICD-10-CM | POA: Diagnosis present

## 2015-02-17 DIAGNOSIS — Z681 Body mass index (BMI) 19 or less, adult: Secondary | ICD-10-CM

## 2015-02-17 DIAGNOSIS — J441 Chronic obstructive pulmonary disease with (acute) exacerbation: Secondary | ICD-10-CM | POA: Diagnosis present

## 2015-02-17 DIAGNOSIS — E785 Hyperlipidemia, unspecified: Secondary | ICD-10-CM | POA: Diagnosis present

## 2015-02-17 DIAGNOSIS — N390 Urinary tract infection, site not specified: Secondary | ICD-10-CM | POA: Diagnosis present

## 2015-02-17 DIAGNOSIS — G9340 Encephalopathy, unspecified: Secondary | ICD-10-CM | POA: Diagnosis present

## 2015-02-17 DIAGNOSIS — F419 Anxiety disorder, unspecified: Secondary | ICD-10-CM | POA: Diagnosis present

## 2015-02-17 DIAGNOSIS — A419 Sepsis, unspecified organism: Principal | ICD-10-CM | POA: Diagnosis present

## 2015-02-17 DIAGNOSIS — G8929 Other chronic pain: Secondary | ICD-10-CM | POA: Diagnosis present

## 2015-02-17 DIAGNOSIS — G4700 Insomnia, unspecified: Secondary | ICD-10-CM | POA: Diagnosis present

## 2015-02-17 DIAGNOSIS — Z79899 Other long term (current) drug therapy: Secondary | ICD-10-CM

## 2015-02-17 DIAGNOSIS — I248 Other forms of acute ischemic heart disease: Secondary | ICD-10-CM | POA: Diagnosis present

## 2015-02-17 DIAGNOSIS — Z7902 Long term (current) use of antithrombotics/antiplatelets: Secondary | ICD-10-CM

## 2015-02-17 DIAGNOSIS — E876 Hypokalemia: Secondary | ICD-10-CM | POA: Diagnosis present

## 2015-02-17 DIAGNOSIS — I4729 Other ventricular tachycardia: Secondary | ICD-10-CM

## 2015-02-17 DIAGNOSIS — E43 Unspecified severe protein-calorie malnutrition: Secondary | ICD-10-CM | POA: Insufficient documentation

## 2015-02-17 DIAGNOSIS — J181 Lobar pneumonia, unspecified organism: Secondary | ICD-10-CM

## 2015-02-17 DIAGNOSIS — F17210 Nicotine dependence, cigarettes, uncomplicated: Secondary | ICD-10-CM | POA: Diagnosis present

## 2015-02-17 DIAGNOSIS — R1310 Dysphagia, unspecified: Secondary | ICD-10-CM | POA: Diagnosis present

## 2015-02-17 DIAGNOSIS — I10 Essential (primary) hypertension: Secondary | ICD-10-CM | POA: Diagnosis present

## 2015-02-17 DIAGNOSIS — Z23 Encounter for immunization: Secondary | ICD-10-CM

## 2015-02-17 DIAGNOSIS — I495 Sick sinus syndrome: Secondary | ICD-10-CM | POA: Diagnosis not present

## 2015-02-17 DIAGNOSIS — M810 Age-related osteoporosis without current pathological fracture: Secondary | ICD-10-CM | POA: Diagnosis present

## 2015-02-17 DIAGNOSIS — K7460 Unspecified cirrhosis of liver: Secondary | ICD-10-CM | POA: Diagnosis present

## 2015-02-17 DIAGNOSIS — D649 Anemia, unspecified: Secondary | ICD-10-CM | POA: Diagnosis present

## 2015-02-17 DIAGNOSIS — K208 Other esophagitis: Secondary | ICD-10-CM | POA: Diagnosis present

## 2015-02-17 DIAGNOSIS — J440 Chronic obstructive pulmonary disease with acute lower respiratory infection: Secondary | ICD-10-CM | POA: Diagnosis present

## 2015-02-17 DIAGNOSIS — K259 Gastric ulcer, unspecified as acute or chronic, without hemorrhage or perforation: Secondary | ICD-10-CM | POA: Diagnosis present

## 2015-02-17 DIAGNOSIS — R6520 Severe sepsis without septic shock: Secondary | ICD-10-CM

## 2015-02-17 DIAGNOSIS — B370 Candidal stomatitis: Secondary | ICD-10-CM | POA: Diagnosis present

## 2015-02-17 DIAGNOSIS — Z79891 Long term (current) use of opiate analgesic: Secondary | ICD-10-CM

## 2015-02-17 DIAGNOSIS — R001 Bradycardia, unspecified: Secondary | ICD-10-CM | POA: Diagnosis present

## 2015-02-17 DIAGNOSIS — Z8542 Personal history of malignant neoplasm of other parts of uterus: Secondary | ICD-10-CM

## 2015-02-17 DIAGNOSIS — I472 Ventricular tachycardia: Secondary | ICD-10-CM

## 2015-02-17 DIAGNOSIS — F411 Generalized anxiety disorder: Secondary | ICD-10-CM | POA: Diagnosis present

## 2015-02-17 HISTORY — DX: Abdominal aortic aneurysm, without rupture: I71.4

## 2015-02-17 LAB — COMPREHENSIVE METABOLIC PANEL
ALK PHOS: 175 U/L — AB (ref 38–126)
ALT: 6 U/L — AB (ref 14–54)
AST: 13 U/L — AB (ref 15–41)
Albumin: 2.3 g/dL — ABNORMAL LOW (ref 3.5–5.0)
Anion gap: 16 — ABNORMAL HIGH (ref 5–15)
BUN: 9 mg/dL (ref 6–20)
CALCIUM: 8.4 mg/dL — AB (ref 8.9–10.3)
CHLORIDE: 99 mmol/L — AB (ref 101–111)
CO2: 24 mmol/L (ref 22–32)
CREATININE: 0.82 mg/dL (ref 0.44–1.00)
Glucose, Bld: 121 mg/dL — ABNORMAL HIGH (ref 65–99)
Potassium: 3.5 mmol/L (ref 3.5–5.1)
Sodium: 139 mmol/L (ref 135–145)
Total Bilirubin: 0.8 mg/dL (ref 0.3–1.2)
Total Protein: 6.9 g/dL (ref 6.5–8.1)

## 2015-02-17 LAB — CBC WITH DIFFERENTIAL/PLATELET
Basophils Absolute: 0 10*3/uL (ref 0.0–0.1)
Basophils Relative: 0 %
EOS PCT: 0 %
Eosinophils Absolute: 0 10*3/uL (ref 0.0–0.7)
HEMATOCRIT: 30.5 % — AB (ref 36.0–46.0)
Hemoglobin: 9.6 g/dL — ABNORMAL LOW (ref 12.0–15.0)
LYMPHS PCT: 8 %
Lymphs Abs: 0.7 10*3/uL (ref 0.7–4.0)
MCH: 29.4 pg (ref 26.0–34.0)
MCHC: 31.5 g/dL (ref 30.0–36.0)
MCV: 93.6 fL (ref 78.0–100.0)
MONO ABS: 0.9 10*3/uL (ref 0.1–1.0)
MONOS PCT: 11 %
NEUTROS ABS: 6.6 10*3/uL (ref 1.7–7.7)
Neutrophils Relative %: 81 %
PLATELETS: 229 10*3/uL (ref 150–400)
RBC: 3.26 MIL/uL — ABNORMAL LOW (ref 3.87–5.11)
RDW: 15.6 % — AB (ref 11.5–15.5)
WBC: 8.2 10*3/uL (ref 4.0–10.5)

## 2015-02-17 LAB — I-STAT CG4 LACTIC ACID, ED: LACTIC ACID, VENOUS: 2.47 mmol/L — AB (ref 0.5–2.0)

## 2015-02-17 LAB — URINE MICROSCOPIC-ADD ON

## 2015-02-17 LAB — URINALYSIS, ROUTINE W REFLEX MICROSCOPIC
GLUCOSE, UA: NEGATIVE mg/dL
KETONES UR: 40 mg/dL — AB
Nitrite: NEGATIVE
PH: 5.5 (ref 5.0–8.0)
Protein, ur: 30 mg/dL — AB
Specific Gravity, Urine: 1.022 (ref 1.005–1.030)

## 2015-02-17 MED ORDER — IPRATROPIUM-ALBUTEROL 0.5-2.5 (3) MG/3ML IN SOLN
3.0000 mL | Freq: Once | RESPIRATORY_TRACT | Status: AC
  Administered 2015-02-17: 3 mL via RESPIRATORY_TRACT
  Filled 2015-02-17: qty 3

## 2015-02-17 MED ORDER — ACETAMINOPHEN 325 MG PO TABS
ORAL_TABLET | ORAL | Status: AC
  Filled 2015-02-17: qty 2

## 2015-02-17 MED ORDER — DEXTROSE 5 % IV SOLN
500.0000 mg | Freq: Once | INTRAVENOUS | Status: AC
  Administered 2015-02-17: 500 mg via INTRAVENOUS
  Filled 2015-02-17: qty 500

## 2015-02-17 MED ORDER — CEFTRIAXONE SODIUM 1 G IJ SOLR
1.0000 g | Freq: Once | INTRAMUSCULAR | Status: AC
  Administered 2015-02-17: 1 g via INTRAVENOUS
  Filled 2015-02-17: qty 10

## 2015-02-17 MED ORDER — SODIUM CHLORIDE 0.9 % IV BOLUS (SEPSIS)
1000.0000 mL | Freq: Once | INTRAVENOUS | Status: AC
  Administered 2015-02-17: 1000 mL via INTRAVENOUS

## 2015-02-17 MED ORDER — ACETAMINOPHEN 325 MG PO TABS
650.0000 mg | ORAL_TABLET | Freq: Once | ORAL | Status: AC
  Administered 2015-02-17: 650 mg via ORAL

## 2015-02-17 NOTE — ED Provider Notes (Signed)
CSN: [id number]     Arrival date & time 02/17/15  2133 History   First MD Initiated Contact with Patient 02/17/15 2238     Chief Complaint  Patient presents with  . Altered Mental Status  . Fever     (Consider location/radiation/quality/duration/timing/severity/associated sxs/prior Treatment) HPI  Level V caveat due to confusion   59 year old female who presents with fever. She has a history of COPD not on home oxygen, GERD, and  HCV. States that she has been feeling ill for 1 week, with chest pain, cough and shortness of breath. Also having dysuria. These she may have also developed episodes of diarrhea with vomiting. States left lower quadrant abdominal pain as well, that has been chronic and unchanged she states. Feeling fevers with chills as well.   Past Medical History  Diagnosis Date  . Anxiety   . Chronic obstructive pulmonary disease ([place])   . Depression   . Nicotine dependence   . Vitamin D deficiency   . Age-related osteoporosis without current pathological fracture   . Viral hepatitis C   . Exposure of implanted vaginal mesh and prosthetic material in vagina   . Hypertension   . GERD (gastroesophageal reflux disease)   . Uterus cancer [facility])    Past Surgical History  Procedure Laterality Date  . Cholecystectomy    . Tubal ligation    . Tonsillectomy    . Abdominal hysterectomy    . Bladder surgery    . Tumor removal      Nose  . Reconstruction of nose    . Neuroplasty / transposition median nerve at carpal tunnel     Family History  Problem Relation Age of Onset  . Diabetes Mother   . Liver cancer Brother   . Breast cancer Mother    Social History  Substance Use Topics  . Smoking status: Current Every Day Smoker    Types: Cigarettes  . Smokeless tobacco: Never Used  . Alcohol Use: No   OB History    No data available     Review of Systems 10/14 systems reviewed and are negative other than those stated in the HPI    Allergies  Mucinex and  Nsaids  Home Medications   Prior to Admission medications   Medication Sig Start Date End Date Taking? Authorizing Provider  albuterol (PROVENTIL HFA;VENTOLIN HFA) 108 (90 BASE) MCG/ACT inhaler Inhale 1 puff into the lungs every 6 (six) hours as needed for wheezing or shortness of breath.    Historical Provider, MD  alendronate (FOSAMAX) 70 MG tablet Take 70 mg by mouth once a week. Take with a full glass of water on an empty stomach.    Historical Provider, MD  ALPRAZolam Duanne Moron) 1 MG tablet Take 1 mg by mouth at bedtime as needed for anxiety.    Historical Provider, MD  Calcium Carbonate (CALTRATE 600 PO) Take 1 tablet by mouth daily.    Historical Provider, MD  clopidogrel (PLAVIX) 75 MG tablet Take 75 mg by mouth daily.    Historical Provider, MD  omeprazole (PRILOSEC) 20 MG capsule Take 20 mg by mouth daily.    Historical Provider, MD  ondansetron (ZOFRAN) 4 MG tablet Take 4 mg by mouth every 8 (eight) hours as needed for nausea or vomiting.    Historical Provider, MD  Oxycodone HCl 10 MG TABS Take 10 mg by mouth daily.    Historical Provider, MD  simvastatin (ZOCOR) 20 MG tablet Take 20 mg by mouth daily.  Historical Provider, MD  tiotropium (SPIRIVA) 18 MCG inhalation capsule Place 18 mcg into inhaler and inhale daily.    Historical Provider, MD  Vitamin D, Ergocalciferol, (DRISDOL) 50000 UNITS CAPS capsule Take 50,000 Units by mouth every 7 (seven) days.    Historical Provider, MD   BP 113/50 mmHg  Pulse 75  Temp(Src) 102.2 F (39 C) (Oral)  Resp 17  SpO2 96% Physical Exam Physical Exam  Nursing note and vitals reviewed. Constitutional:  Frail and cachectic in appearance. Appears ill acutely,  in no acute distress Head: Normocephalic and atraumatic.  Mouth/Throat: Oropharynx is clear.  Mucous membranes are dry.  Neck: Normal range of motion. Neck supple.  Cardiovascular:  Tachycardic rate and  Irregularly irregular rhythm.   no lower extremity edema Pulmonary/Chest: Effort  normal.  No conversational dyspnea. expiratory wheezes and rhonchi with auscultation of the right anterior chest wall. Abdominal: Soft. There is reported mild tenderness in the  Left lower quadrant. There is no rebound and no guarding.  Musculoskeletal: Normal range of motion.  Neurological: Alert, no facial droop, fluent speech, moves all extremities symmetrically Skin: Skin is warm and dry.  Psychiatric: Cooperative  ED Course  Procedures (including critical care time) Labs Review Labs Reviewed  COMPREHENSIVE METABOLIC PANEL - Abnormal; Notable for the following:    Chloride 99 (*)    Glucose, Bld 121 (*)    Calcium 8.4 (*)    Albumin 2.3 (*)    AST 13 (*)    ALT 6 (*)    Alkaline Phosphatase 175 (*)    Anion gap 16 (*)    All other components within normal limits  CBC WITH DIFFERENTIAL/PLATELET - Abnormal; Notable for the following:    RBC 3.26 (*)    Hemoglobin 9.6 (*)    HCT 30.5 (*)    RDW 15.6 (*)    All other components within normal limits  URINALYSIS, ROUTINE W REFLEX MICROSCOPIC (NOT AT [facility]) - Abnormal; Notable for the following:    Color, Urine AMBER (*)    APPearance CLOUDY (*)    Hgb urine dipstick SMALL (*)    Bilirubin Urine MODERATE (*)    Ketones, ur 40 (*)    Protein, ur 30 (*)    Leukocytes, UA TRACE (*)    All other components within normal limits  URINE MICROSCOPIC-ADD ON - Abnormal; Notable for the following:    Squamous Epithelial / LPF 0-5 (*)    Bacteria, UA FEW (*)    Casts HYALINE CASTS (*)    All other components within normal limits  I-STAT CG4 LACTIC ACID, ED - Abnormal; Notable for the following:    Lactic Acid, Venous 2.47 (*)    All other components within normal limits  CULTURE, BLOOD (ROUTINE X 2)  CULTURE, BLOOD (ROUTINE X 2)  URINE CULTURE  CULTURE, BLOOD (SINGLE)  I-STAT TROPOININ, ED    Imaging Review Dg Chest 2 View  02/17/2015  CLINICAL DATA:  59 year old female with confusion and disorientation and fever. EXAM: CHEST  2  VIEW COMPARISON:  Chest radiograph dated 03/01/2012 FINDINGS: Two views of the chest demonstrate multiple patchy areas of increased opacity in the mid to lower right lower lung field compatible with pneumonia. There is a small right pleural effusion. The left lung is clear. There is no pneumothorax. The cardiac silhouette is within normal limits. The osseous structures appear unremarkable. IMPRESSION: Pneumonia. Small right pleural effusion. Clinical correlation and follow-up recommended. Electronically Signed   By: Laren Everts.D.  On: 02/17/2015 22:47   I have personally reviewed and evaluated these images and lab results as part of my medical decision-making.   EKG Interpretation   Date/Time:  Wednesday February 17 2015 22:51:16 EST Ventricular Rate:  106 PR Interval:    QRS Duration: 96 QT Interval:  332 QTC Calculation: 441 R Axis:   75 Text Interpretation:  Atrial fibrillation Ventricular premature complex  RSR' in V1 or V2, right VCD or RVH Nonspecific T abnormalities, lateral  leads Sinus tachycardia with PVCs and PACs ST depressions in inferolateral  leads No seen on prior EKg Confirmed by LIU MD, DANA [phone]) on 02/17/2015  11:03:55 PM     CRITICAL CARE Performed by: Forde Dandy   Total critical care time: 35 minutes  Critical care time was exclusive of separately billable procedures and treating other patients.  Critical care was necessary to treat or prevent imminent or life-threatening deterioration.  Critical care was time spent personally by me on the following activities: development of treatment plan with patient and/or surrogate as well as nursing, discussions with consultants, evaluation of patient's response to treatment, examination of patient, obtaining history from patient or surrogate, ordering and performing treatments and interventions, ordering and review of laboratory studies, ordering and review of radiographic studies, pulse oximetry and  re-evaluation of patient's condition.  MDM   Final diagnoses:  Severe sepsis ([place])  Lobar pneumonia ([place])  Community acquired pneumonia    Presenting with sepsis due to community acquired pneumonia.  On arrival, she is febrile, tachycardic, and hypotensive. Has a 2 L oxygen requirement, and does not wear oxygen at baseline. Has rhonchorous breath sounds with auscultation of the anterior right chest wall. Clinically has evidence of pneumonia, and chest x-ray suggestive of right middle and right lower lobe pneumonia. Abdomen soft and benign with LLQ abd pain, which she states is chronic. UA with leukocytes and bacteria, sent for culture.  Blood work with elevated lactate to 2.47, no leukocytosis or other major electrolyte or metabolic derangements.   Hypotension is responsive with IV fluids, and she is receiving 2 L. Started on ceftriaxone and azithromycin  For community-acquired pneumonia. Is discussed with Dr. Roel Cluck who will admit to hospitalist  Service for ongoing management.    Forde Dandy, MD 02/17/15 [phone]

## 2015-02-17 NOTE — ED Notes (Signed)
Family reported pt.'s confusion/disorientation for several days with fever , generalized weakness/fatigue , nausea , emesis and generalized abdominal pain for several days . Pt. is disoriented to time at arrival .

## 2015-02-18 ENCOUNTER — Inpatient Hospital Stay (HOSPITAL_COMMUNITY): Payer: Medicaid Other

## 2015-02-18 ENCOUNTER — Encounter (HOSPITAL_COMMUNITY): Payer: Self-pay | Admitting: Internal Medicine

## 2015-02-18 DIAGNOSIS — J181 Lobar pneumonia, unspecified organism: Secondary | ICD-10-CM | POA: Diagnosis present

## 2015-02-18 DIAGNOSIS — F419 Anxiety disorder, unspecified: Secondary | ICD-10-CM | POA: Diagnosis present

## 2015-02-18 DIAGNOSIS — B370 Candidal stomatitis: Secondary | ICD-10-CM | POA: Diagnosis present

## 2015-02-18 DIAGNOSIS — Z681 Body mass index (BMI) 19 or less, adult: Secondary | ICD-10-CM | POA: Diagnosis not present

## 2015-02-18 DIAGNOSIS — D509 Iron deficiency anemia, unspecified: Secondary | ICD-10-CM | POA: Diagnosis not present

## 2015-02-18 DIAGNOSIS — B001 Herpesviral vesicular dermatitis: Secondary | ICD-10-CM | POA: Diagnosis present

## 2015-02-18 DIAGNOSIS — R410 Disorientation, unspecified: Secondary | ICD-10-CM | POA: Diagnosis present

## 2015-02-18 DIAGNOSIS — A419 Sepsis, unspecified organism: Secondary | ICD-10-CM | POA: Diagnosis present

## 2015-02-18 DIAGNOSIS — K259 Gastric ulcer, unspecified as acute or chronic, without hemorrhage or perforation: Secondary | ICD-10-CM | POA: Diagnosis present

## 2015-02-18 DIAGNOSIS — R001 Bradycardia, unspecified: Secondary | ICD-10-CM | POA: Diagnosis present

## 2015-02-18 DIAGNOSIS — J189 Pneumonia, unspecified organism: Secondary | ICD-10-CM | POA: Diagnosis present

## 2015-02-18 DIAGNOSIS — J441 Chronic obstructive pulmonary disease with (acute) exacerbation: Secondary | ICD-10-CM | POA: Diagnosis present

## 2015-02-18 DIAGNOSIS — Z79891 Long term (current) use of opiate analgesic: Secondary | ICD-10-CM | POA: Diagnosis not present

## 2015-02-18 DIAGNOSIS — G9340 Encephalopathy, unspecified: Secondary | ICD-10-CM | POA: Diagnosis present

## 2015-02-18 DIAGNOSIS — R1310 Dysphagia, unspecified: Secondary | ICD-10-CM | POA: Diagnosis present

## 2015-02-18 DIAGNOSIS — G8929 Other chronic pain: Secondary | ICD-10-CM | POA: Diagnosis not present

## 2015-02-18 DIAGNOSIS — F17210 Nicotine dependence, cigarettes, uncomplicated: Secondary | ICD-10-CM | POA: Diagnosis present

## 2015-02-18 DIAGNOSIS — J440 Chronic obstructive pulmonary disease with acute lower respiratory infection: Secondary | ICD-10-CM | POA: Diagnosis present

## 2015-02-18 DIAGNOSIS — E785 Hyperlipidemia, unspecified: Secondary | ICD-10-CM | POA: Diagnosis present

## 2015-02-18 DIAGNOSIS — F411 Generalized anxiety disorder: Secondary | ICD-10-CM | POA: Diagnosis not present

## 2015-02-18 DIAGNOSIS — I495 Sick sinus syndrome: Secondary | ICD-10-CM | POA: Diagnosis not present

## 2015-02-18 DIAGNOSIS — R6520 Severe sepsis without septic shock: Secondary | ICD-10-CM | POA: Insufficient documentation

## 2015-02-18 DIAGNOSIS — E876 Hypokalemia: Secondary | ICD-10-CM | POA: Diagnosis present

## 2015-02-18 DIAGNOSIS — Z7982 Long term (current) use of aspirin: Secondary | ICD-10-CM | POA: Diagnosis not present

## 2015-02-18 DIAGNOSIS — B182 Chronic viral hepatitis C: Secondary | ICD-10-CM | POA: Diagnosis present

## 2015-02-18 DIAGNOSIS — M810 Age-related osteoporosis without current pathological fracture: Secondary | ICD-10-CM | POA: Diagnosis present

## 2015-02-18 DIAGNOSIS — N390 Urinary tract infection, site not specified: Secondary | ICD-10-CM | POA: Diagnosis not present

## 2015-02-18 DIAGNOSIS — Z79899 Other long term (current) drug therapy: Secondary | ICD-10-CM | POA: Diagnosis not present

## 2015-02-18 DIAGNOSIS — K208 Other esophagitis: Secondary | ICD-10-CM | POA: Diagnosis present

## 2015-02-18 DIAGNOSIS — E43 Unspecified severe protein-calorie malnutrition: Secondary | ICD-10-CM | POA: Diagnosis not present

## 2015-02-18 DIAGNOSIS — K7460 Unspecified cirrhosis of liver: Secondary | ICD-10-CM | POA: Diagnosis present

## 2015-02-18 DIAGNOSIS — Z23 Encounter for immunization: Secondary | ICD-10-CM | POA: Diagnosis not present

## 2015-02-18 DIAGNOSIS — G4700 Insomnia, unspecified: Secondary | ICD-10-CM | POA: Diagnosis present

## 2015-02-18 DIAGNOSIS — Z8542 Personal history of malignant neoplasm of other parts of uterus: Secondary | ICD-10-CM | POA: Diagnosis not present

## 2015-02-18 DIAGNOSIS — Z7902 Long term (current) use of antithrombotics/antiplatelets: Secondary | ICD-10-CM | POA: Diagnosis not present

## 2015-02-18 DIAGNOSIS — I10 Essential (primary) hypertension: Secondary | ICD-10-CM | POA: Diagnosis present

## 2015-02-18 DIAGNOSIS — I248 Other forms of acute ischemic heart disease: Secondary | ICD-10-CM | POA: Diagnosis present

## 2015-02-18 DIAGNOSIS — D649 Anemia, unspecified: Secondary | ICD-10-CM | POA: Diagnosis present

## 2015-02-18 DIAGNOSIS — I472 Ventricular tachycardia: Secondary | ICD-10-CM | POA: Diagnosis not present

## 2015-02-18 DIAGNOSIS — I471 Supraventricular tachycardia: Secondary | ICD-10-CM | POA: Diagnosis not present

## 2015-02-18 DIAGNOSIS — I714 Abdominal aortic aneurysm, without rupture: Secondary | ICD-10-CM | POA: Diagnosis present

## 2015-02-18 DIAGNOSIS — J9601 Acute respiratory failure with hypoxia: Secondary | ICD-10-CM | POA: Diagnosis not present

## 2015-02-18 LAB — MRSA PCR SCREENING: MRSA BY PCR: NEGATIVE

## 2015-02-18 LAB — COMPREHENSIVE METABOLIC PANEL
ALT: 7 U/L — ABNORMAL LOW (ref 14–54)
AST: 11 U/L — AB (ref 15–41)
Albumin: 1.7 g/dL — ABNORMAL LOW (ref 3.5–5.0)
Alkaline Phosphatase: 143 U/L — ABNORMAL HIGH (ref 38–126)
Anion gap: 12 (ref 5–15)
CHLORIDE: 104 mmol/L (ref 101–111)
CO2: 24 mmol/L (ref 22–32)
Calcium: 7.1 mg/dL — ABNORMAL LOW (ref 8.9–10.3)
Creatinine, Ser: 0.51 mg/dL (ref 0.44–1.00)
Glucose, Bld: 120 mg/dL — ABNORMAL HIGH (ref 65–99)
POTASSIUM: 2.4 mmol/L — AB (ref 3.5–5.1)
Sodium: 140 mmol/L (ref 135–145)
Total Bilirubin: 0.5 mg/dL (ref 0.3–1.2)
Total Protein: 5.6 g/dL — ABNORMAL LOW (ref 6.5–8.1)

## 2015-02-18 LAB — INFLUENZA PANEL BY PCR (TYPE A & B)
H1N1 flu by pcr: NOT DETECTED
INFLBPCR: NEGATIVE
Influenza A By PCR: NEGATIVE

## 2015-02-18 LAB — PROTIME-INR
INR: 1.27 (ref 0.00–1.49)
INR: 1.32 (ref 0.00–1.49)
PROTHROMBIN TIME: 16 s — AB (ref 11.6–15.2)
Prothrombin Time: 16.5 seconds — ABNORMAL HIGH (ref 11.6–15.2)

## 2015-02-18 LAB — IRON AND TIBC
Iron: 9 ug/dL — ABNORMAL LOW (ref 28–170)
SATURATION RATIOS: 5 % — AB (ref 10.4–31.8)
TIBC: 168 ug/dL — AB (ref 250–450)
UIBC: 159 ug/dL

## 2015-02-18 LAB — I-STAT ARTERIAL BLOOD GAS, ED
ACID-BASE DEFICIT: 2 mmol/L (ref 0.0–2.0)
Bicarbonate: 22.3 mEq/L (ref 20.0–24.0)
O2 SAT: 91 %
PH ART: 7.366 (ref 7.350–7.450)
PO2 ART: 68 mmHg — AB (ref 80.0–100.0)
TCO2: 23 mmol/L (ref 0–100)
pCO2 arterial: 39.7 mmHg (ref 35.0–45.0)

## 2015-02-18 LAB — LACTIC ACID, PLASMA
LACTIC ACID, VENOUS: 1 mmol/L (ref 0.5–2.0)
Lactic Acid, Venous: 1.5 mmol/L (ref 0.5–2.0)

## 2015-02-18 LAB — CBC WITH DIFFERENTIAL/PLATELET
BASOS ABS: 0 10*3/uL (ref 0.0–0.1)
BASOS PCT: 0 %
EOS ABS: 0 10*3/uL (ref 0.0–0.7)
EOS PCT: 0 %
HCT: 24.7 % — ABNORMAL LOW (ref 36.0–46.0)
Hemoglobin: 7.9 g/dL — ABNORMAL LOW (ref 12.0–15.0)
LYMPHS PCT: 12 %
Lymphs Abs: 0.7 10*3/uL (ref 0.7–4.0)
MCH: 30.2 pg (ref 26.0–34.0)
MCHC: 32 g/dL (ref 30.0–36.0)
MCV: 94.3 fL (ref 78.0–100.0)
MONO ABS: 0.4 10*3/uL (ref 0.1–1.0)
Monocytes Relative: 8 %
Neutro Abs: 4.2 10*3/uL (ref 1.7–7.7)
Neutrophils Relative %: 80 %
PLATELETS: 149 10*3/uL — AB (ref 150–400)
RBC: 2.62 MIL/uL — ABNORMAL LOW (ref 3.87–5.11)
RDW: 15.6 % — AB (ref 11.5–15.5)
WBC: 5.3 10*3/uL (ref 4.0–10.5)

## 2015-02-18 LAB — FERRITIN: Ferritin: 101 ng/mL (ref 11–307)

## 2015-02-18 LAB — TROPONIN I
TROPONIN I: 0.08 ng/mL — AB (ref <0.031)
TROPONIN I: 0.22 ng/mL — AB (ref <0.031)
TROPONIN I: 0.18 ng/mL — AB (ref <0.031)

## 2015-02-18 LAB — TSH: TSH: 0.35 u[IU]/mL (ref 0.350–4.500)

## 2015-02-18 LAB — PROCALCITONIN: PROCALCITONIN: 0.78 ng/mL

## 2015-02-18 LAB — FOLATE: Folate: 13.6 ng/mL (ref >5.9)

## 2015-02-18 LAB — HIV ANTIBODY (ROUTINE TESTING W REFLEX)

## 2015-02-18 LAB — GLUCOSE, CAPILLARY
GLUCOSE-CAPILLARY: 96 mg/dL (ref 65–99)
Glucose-Capillary: 100 mg/dL — ABNORMAL HIGH (ref 65–99)

## 2015-02-18 LAB — STREP PNEUMONIAE URINARY ANTIGEN: STREP PNEUMO URINARY ANTIGEN: NEGATIVE

## 2015-02-18 LAB — I-STAT CG4 LACTIC ACID, ED: LACTIC ACID, VENOUS: 1.61 mmol/L (ref 0.5–2.0)

## 2015-02-18 LAB — PHOSPHORUS: Phosphorus: 2.6 mg/dL (ref 2.5–4.6)

## 2015-02-18 LAB — VITAMIN B12: VITAMIN B 12: 566 pg/mL (ref 180–914)

## 2015-02-18 LAB — MAGNESIUM: Magnesium: 1.5 mg/dL — ABNORMAL LOW (ref 1.7–2.4)

## 2015-02-18 MED ORDER — IPRATROPIUM BROMIDE 0.02 % IN SOLN
0.5000 mg | Freq: Four times a day (QID) | RESPIRATORY_TRACT | Status: DC
  Administered 2015-02-18 (×2): 0.5 mg via RESPIRATORY_TRACT
  Filled 2015-02-18 (×3): qty 2.5

## 2015-02-18 MED ORDER — SODIUM CHLORIDE 0.9 % IV BOLUS (SEPSIS)
1000.0000 mL | Freq: Once | INTRAVENOUS | Status: AC
  Administered 2015-02-18: 1000 mL via INTRAVENOUS

## 2015-02-18 MED ORDER — SODIUM CHLORIDE 0.9 % IV SOLN
INTRAVENOUS | Status: DC
  Administered 2015-02-18 (×2): via INTRAVENOUS
  Administered 2015-02-19: 1000 mL via INTRAVENOUS

## 2015-02-18 MED ORDER — VANCOMYCIN HCL IN DEXTROSE 1-5 GM/200ML-% IV SOLN
1000.0000 mg | Freq: Once | INTRAVENOUS | Status: AC
  Administered 2015-02-18: 1000 mg via INTRAVENOUS
  Filled 2015-02-18: qty 200

## 2015-02-18 MED ORDER — PNEUMOCOCCAL VAC POLYVALENT 25 MCG/0.5ML IJ INJ
0.5000 mL | INJECTION | INTRAMUSCULAR | Status: DC
  Filled 2015-02-18: qty 0.5

## 2015-02-18 MED ORDER — POTASSIUM CHLORIDE 10 MEQ/100ML IV SOLN
10.0000 meq | INTRAVENOUS | Status: AC
  Administered 2015-02-18 (×2): 10 meq via INTRAVENOUS
  Filled 2015-02-18 (×2): qty 100

## 2015-02-18 MED ORDER — VANCOMYCIN HCL IN DEXTROSE 1-5 GM/200ML-% IV SOLN
1000.0000 mg | Freq: Two times a day (BID) | INTRAVENOUS | Status: DC
Start: 2015-02-18 — End: 2015-02-21
  Administered 2015-02-18 – 2015-02-21 (×6): 1000 mg via INTRAVENOUS
  Filled 2015-02-18 (×9): qty 200

## 2015-02-18 MED ORDER — SODIUM CHLORIDE 0.9 % IV BOLUS (SEPSIS): 500.0000 mL | INTRAVENOUS | Status: AC

## 2015-02-18 MED ORDER — PREDNISONE 50 MG PO TABS
50.0000 mg | ORAL_TABLET | Freq: Every day | ORAL | Status: DC
  Administered 2015-02-18 – 2015-02-23 (×6): 50 mg via ORAL
  Filled 2015-02-18 (×7): qty 1

## 2015-02-18 MED ORDER — AZITHROMYCIN 500 MG IV SOLR: 500.0000 mg | Freq: Once | INTRAVENOUS | Status: DC

## 2015-02-18 MED ORDER — ENOXAPARIN SODIUM 40 MG/0.4ML ~~LOC~~ SOLN
40.0000 mg | SUBCUTANEOUS | Status: DC
  Administered 2015-02-18 – 2015-02-26 (×9): 40 mg via SUBCUTANEOUS
  Filled 2015-02-18 (×9): qty 0.4

## 2015-02-18 MED ORDER — CHLORHEXIDINE GLUCONATE 0.12 % MT SOLN
15.0000 mL | Freq: Two times a day (BID) | OROMUCOSAL | Status: DC
  Administered 2015-02-18 – 2015-02-25 (×15): 15 mL via OROMUCOSAL
  Filled 2015-02-18 (×15): qty 15

## 2015-02-18 MED ORDER — OXYCODONE HCL 5 MG PO TABS
10.0000 mg | ORAL_TABLET | Freq: Three times a day (TID) | ORAL | Status: DC
  Administered 2015-02-18 – 2015-02-26 (×34): 10 mg via ORAL
  Filled 2015-02-18 (×35): qty 2

## 2015-02-18 MED ORDER — AZITHROMYCIN 500 MG PO TABS
500.0000 mg | ORAL_TABLET | Freq: Every day | ORAL | Status: DC
  Administered 2015-02-18 – 2015-02-19 (×2): 500 mg via ORAL
  Filled 2015-02-18 (×2): qty 1

## 2015-02-18 MED ORDER — DEXTROSE 5 % IV SOLN
1.0000 g | INTRAVENOUS | Status: DC
  Administered 2015-02-18 – 2015-02-24 (×7): 1 g via INTRAVENOUS
  Filled 2015-02-18 (×11): qty 10

## 2015-02-18 MED ORDER — POTASSIUM CHLORIDE CRYS ER 20 MEQ PO TBCR
40.0000 meq | EXTENDED_RELEASE_TABLET | ORAL | Status: AC
  Administered 2015-02-18 (×2): 40 meq via ORAL
  Filled 2015-02-18 (×2): qty 2

## 2015-02-18 MED ORDER — LEVALBUTEROL HCL 0.63 MG/3ML IN NEBU: 0.6300 mg | INHALATION_SOLUTION | RESPIRATORY_TRACT | Status: DC | PRN

## 2015-02-18 MED ORDER — IPRATROPIUM-ALBUTEROL 0.5-2.5 (3) MG/3ML IN SOLN
3.0000 mL | Freq: Four times a day (QID) | RESPIRATORY_TRACT | Status: DC
  Administered 2015-02-18 – 2015-02-23 (×18): 3 mL via RESPIRATORY_TRACT
  Filled 2015-02-18 (×21): qty 3

## 2015-02-18 MED ORDER — ALPRAZOLAM 0.5 MG PO TABS
0.5000 mg | ORAL_TABLET | Freq: Two times a day (BID) | ORAL | Status: DC
  Administered 2015-02-18 (×2): 0.5 mg via ORAL
  Filled 2015-02-18: qty 1
  Filled 2015-02-18: qty 2

## 2015-02-18 MED ORDER — WHITE PETROLATUM GEL
Status: AC
  Administered 2015-02-18: 0.2
  Filled 2015-02-18: qty 1

## 2015-02-18 MED ORDER — MAGIC MOUTHWASH W/LIDOCAINE
5.0000 mL | Freq: Four times a day (QID) | ORAL | Status: DC | PRN
  Administered 2015-02-18 – 2015-02-26 (×15): 5 mL via ORAL
  Filled 2015-02-18 (×17): qty 5

## 2015-02-18 MED ORDER — CETYLPYRIDINIUM CHLORIDE 0.05 % MT LIQD
7.0000 mL | Freq: Two times a day (BID) | OROMUCOSAL | Status: DC
  Administered 2015-02-18 – 2015-02-26 (×14): 7 mL via OROMUCOSAL

## 2015-02-18 NOTE — Progress Notes (Signed)
[facility] PROGRESS NOTE  [name] BDZ:[id number] DOB: [birth year] DOA: 02/17/2015 PCP: Lorelee Market, MD  Brief Summary  [name] is a 59 y.o. Female with history of Anxiety; Chronic obstructive pulmonary disease ([place]); Depression; Nicotine dependence, hepatitis C; Hypertension; GERD; and Uterus cancer ([place]) who presented with a several day history of generalized weakness, fevers, nausea and vomiting.  She had been coughing for 3 weeks. For the past 1 week she had intermittent chest pains worse with coughing or shortness of breath.  She has chronic diarrhea and abdominal pain. Patient also had some dysuria.  Emergency department patient was found to be hypoxic down to 84 on RA and have lactic acid of 2.46 or worrisome for pneumonia of small right pleural effusion she was febrile up to 102.2 within tachycardia to 116 meeting sepsis criteria  Hospitalist was called for admission for sepsis likely secondary to community-acquired pneumonia and for future   Assessment/Plan  Sepsis and acute hypoxic respiratory failure secondary to right lower lobe pneumonia  -  Fevers trending down -  Continue vancomycin, ceftriaxone -  Given degree of hypoxia, GI symptoms, agree with continuing atypical coverage until legionella is reported -  Start prednisone burst -  Wean oxygen as tolerated, keeping O2 88-92%  Possible UTI -  See abx above -  UCx pending  Nausea and vomiting, only one episode of emesis  -  Continue antiemetics prn  Atypical chest pains are likely related to right sided pneumonia > pleuritic, right chest -  Tylenol prn  Mildly elevated troponins, likely demand ischemia from severe pneumonia.  Has ST segment depressions in the inferolateral leads which appear similar from one ECG to next -  ASA allergy -  No BB due to wheezing -  Check lipid panel and A1c -  Consider statin, however, she is pretty frail given age so judicious use/consider geriatric dosing -   ECHO -  No heparin gtt at this time -  Recommend outpatient stress test/cardiology consult after recovery from acute illness unless significant spike in troponins  COPD, at risk for exacerbation -  Continue duonebs  Hepatitis C without coma, stable, f/u with ID or GI as outpatient  Normocytic anemia, hgb trending down, no obvious bleeding -  Iron studies, B12, folate -  TSH -  Occult stool -  Repeat hgb in AM  Thin/frail -  Nutrition consultation -  Regular diet  Fever blisters, start magic mouthwash   Hypokalemia -  Check magnesium -  Start oral and IV potassium -  Repeat potassium in AM  Calcium corrects with albumin  Diet:  Regular Access:  PIV IVF:  yes Proph:  lovenox  Code Status: full Family Communication: patient alone Disposition Plan: pending improvem   Consultants:  None  Procedures:  ECHO  Antibiotics:  Vancomycin   Ceftriaxone  Azithromycin  HPI/Subjective:  Feels very unwell.  Has nausea and one episode of emesis, bilious, nonbloody.  Having some cough and some mostly constant and pleuritic right chest and right upper quadrant abdominal pain that radiates to her back.  Denies substernal chest pain or pressure.    Objective: Filed Vitals:   02/18/15 1000 02/18/15 1100 02/18/15 1136 02/18/15 1200  BP: 127/63 119/58  142/65  Pulse: 99 70    Temp:   97.6 F (36.4 C)   TempSrc:   Oral   Resp: '16 20  22  '$ Height:      Weight:      SpO2: 95% 96%  95%    Intake/Output Summary (Last 24 hours) at 02/18/15 1320 Last data filed at 02/18/15 1200  Gross per 24 hour  Intake    650 ml  Output    300 ml  Net    350 ml   Filed Weights   02/18/15 0800  Weight: 52 kg (114 lb 10.2 oz)   Body mass index is 17.95 kg/(m^2).  Exam:   General:  Adult female, ill-appearing, unable to sit up in bed without assistance  HEENT:  NCAT, dry tongue  Cardiovascular:  RRR, nl S1, S2 no mrg, 2+ pulses, warm extremities  Respiratory:  Diminished  breath sounds on right anterior chest and under right axilla, no rhonchi, wheeze, or rales, no increased WOB  Abdomen:   NABS, soft, NT/ND  MSK:   Normal tone and bulk, no LEE  Neuro:  Diffusely week  Data Reviewed: Basic Metabolic Panel:  Recent Labs Lab 02/17/15 2222 02/18/15 1045  NA 139 140  K 3.5 2.4*  CL 99* 104  CO2 24 24  GLUCOSE 121* 120*  BUN 9 <5*  CREATININE 0.82 0.51  CALCIUM 8.4* 7.1*   Liver Function Tests:  Recent Labs Lab 02/17/15 2222 02/18/15 1045  AST 13* 11*  ALT 6* 7*  ALKPHOS 175* 143*  BILITOT 0.8 0.5  PROT 6.9 5.6*  ALBUMIN 2.3* 1.7*   No results for input(s): LIPASE, AMYLASE in the last 168 hours. No results for input(s): AMMONIA in the last 168 hours. CBC:  Recent Labs Lab 02/17/15 2222 02/18/15 1045  WBC 8.2 5.3  NEUTROABS 6.6 4.2  HGB 9.6* 7.9*  HCT 30.5* 24.7*  MCV 93.6 94.3  PLT 229 149*    Recent Results (from the past 240 hour(s))  Culture, blood (routine x 2)     Status: None (Preliminary result)   Collection Time: 02/17/15  9:55 PM  Result Value Ref Range Status   Specimen Description BLOOD LEFT ARM  Final   Special Requests BOTTLES DRAWN AEROBIC AND ANAEROBIC 5ML  Final   Culture NO GROWTH < 12 HOURS  Final   Report Status PENDING  Incomplete  Culture, blood (routine x 2)     Status: None (Preliminary result)   Collection Time: 02/17/15  9:59 PM  Result Value Ref Range Status   Specimen Description BLOOD RIGHT ARM  Final   Special Requests AEROBIC BOTTLE ONLY 5ML  Final   Culture NO GROWTH < 12 HOURS  Final   Report Status PENDING  Incomplete  MRSA PCR Screening     Status: None   Collection Time: 02/18/15  7:36 AM  Result Value Ref Range Status   MRSA by PCR NEGATIVE NEGATIVE Final    Comment:        The GeneXpert MRSA Assay (FDA approved for NASAL specimens only), is one component of a comprehensive MRSA colonization surveillance program. It is not intended to diagnose MRSA infection nor to guide  or monitor treatment for MRSA infections.      Studies: Dg Chest 2 View  02/17/2015  CLINICAL DATA:  59 year old female with confusion and disorientation and fever. EXAM: CHEST  2 VIEW COMPARISON:  Chest radiograph dated 03/01/2012 FINDINGS: Two views of the chest demonstrate multiple patchy areas of increased opacity in the mid to lower right lower lung field compatible with pneumonia. There is a small right pleural effusion. The left lung is clear. There is no pneumothorax. The cardiac silhouette is within normal limits. The osseous structures appear unremarkable. IMPRESSION: Pneumonia. Small  right pleural effusion. Clinical correlation and follow-up recommended. Electronically Signed   By: Anner Crete M.D.   On: 02/17/2015 22:47    Scheduled Meds: . ALPRAZolam  0.5 mg Oral BID  . antiseptic oral rinse  7 mL Mouth Rinse q12n4p  . azithromycin  500 mg Oral Daily  . cefTRIAXone (ROCEPHIN)  IV  1 g Intravenous Q24H  . chlorhexidine  15 mL Mouth Rinse BID  . enoxaparin (LOVENOX) injection  40 mg Subcutaneous Q24H  . ipratropium  0.5 mg Nebulization Q6H  . oxyCODONE  10 mg Oral TID AC & HS  . [START ON 02/19/2015] pneumococcal 23 valent vaccine  0.5 mL Intramuscular Tomorrow-1000  . potassium chloride  10 mEq Intravenous Q1 Hr x 2  . potassium chloride  40 mEq Oral Q4H  . vancomycin  1,000 mg Intravenous BID   Continuous Infusions: . sodium chloride 75 mL/hr at 02/18/15 1104    Active Problems:   Sepsis ([place])   UTI (lower urinary tract infection)   CAP (community acquired pneumonia)   Anemia   Hep C w/o coma, chronic ([place])    Time spent: 30 min    Shayne Diguglielmo, [facility] Pager [phone]. If 7PM-7AM, please contact night-coverage at [email or web address], password [facility] 02/18/2015, 1:20 PM  LOS: 0 days

## 2015-02-18 NOTE — Progress Notes (Signed)
  Echocardiogram 2D Echocardiogram has been performed.  [name] M 02/18/2015, 1:22 PM

## 2015-02-18 NOTE — Progress Notes (Signed)
Utilization review completed. Marcas Bowsher, RN, BSN. 

## 2015-02-18 NOTE — Progress Notes (Signed)
ANTIBIOTIC CONSULT NOTE - INITIAL  Pharmacy Consult for Vancomycin  Indication: rule out pneumonia  Allergies  Allergen Reactions  . Mucinex [Guaifenesin Er] Anaphylaxis  . Nsaids Anaphylaxis    Patient Measurements:    Vital Signs: Temp: 102.2 F (39 C) (12/28 2149) Temp Source: Oral (12/28 2149) BP: 126/53 mmHg (12/29 0200) Pulse Rate: 95 (12/29 0200) Intake/Output from previous day:   Intake/Output from this shift:    Labs:  Recent Labs  02/17/15 2222  WBC 8.2  HGB 9.6*  PLT 229  CREATININE 0.82   CrCl cannot be calculated (Unknown ideal weight.). No results for input(s): VANCOTROUGH, VANCOPEAK, VANCORANDOM, GENTTROUGH, GENTPEAK, GENTRANDOM, TOBRATROUGH, TOBRAPEAK, TOBRARND, AMIKACINPEAK, AMIKACINTROU, AMIKACIN in the last 72 hours.   Microbiology: No results found for this or any previous visit (from the past 720 hour(s)).  Medical History: Past Medical History  Diagnosis Date  . Anxiety   . Chronic obstructive pulmonary disease ([place])   . Depression   . Nicotine dependence   . Vitamin D deficiency   . Age-related osteoporosis without current pathological fracture   . Viral hepatitis C   . Exposure of implanted vaginal mesh and prosthetic material in vagina   . Hypertension   . GERD (gastroesophageal reflux disease)   . Uterus cancer ([place])     Medications:  Xanax  ASA  OxyIR  Assessment: 59 y.o. female with fever/PNA for empiric antibiotics  Goal of Therapy:  Vancomycin trough level 15-20 mcg/ml  Plan:  Vancomycin 1 g IV q12h  Raeanne Deschler, [place] Curb 02/18/2015,2:49 AM

## 2015-02-18 NOTE — H&P (Signed)
PCP:  Lorelee Market, MD    Referring provider Liu   Chief Complaint: confusion and disorientation  HPI: [name] is a 59 y.o. female   has a past medical history of Anxiety; Chronic obstructive pulmonary disease ([place]); Depression; Nicotine dependence; Vitamin D deficiency; Age-related osteoporosis without current pathological fracture; Viral hepatitis C; Exposure of implanted vaginal mesh and prosthetic material in vagina; Hypertension; GERD (gastroesophageal reflux disease); and Uterus cancer ([place]).   Presented with  Patient has been febrile for past few days she been endorsing some generalized weakness some nausea and vomiting she was confused at the time of arrival to emergency department. Reports have been coughing for the past 3 weeks. For the past 1 week she's been having intermittent chest pains worse with coughing or shortness of breath. Patient also endorses some diarrhea and abdominal pain which may have been chronic. Patient also endorsed some dysuria. Emergency department patient was found to be hypoxic down to 84 on RA and have lactic acid of 2.46 or worrisome for pneumonia of small right pleural effusion she was febrile up to 102.2 within tachycardia to 116 meeting sepsis criteria  Hospitalist was called for admission for sepsis likely secondary to community-acquired pneumonia and for future  Review of Systems:    Pertinent positives include: Fevers, chills, fatigue, abdominal pain, nausea, vomiting, diarrhea,  loss of appetite, chest pain,  shortness of breath at rest. dyspnea on exertion,productive cough,  dysuria, Constitutional:  No weight loss, night sweats, weight loss  HEENT:  No headaches, Difficulty swallowing,Tooth/dental problems,Sore throat,  No sneezing, itching, ear ache, nasal congestion, post nasal drip,  Cardio-vascular:  No Orthopnea, PND, anasarca, dizziness, palpitations.no Bilateral lower extremity swelling  GI:  No heartburn,  indigestion, change in bowel habits, melena, blood in stool, hematemesis Resp:    No excess mucus,   No non-productive cough, No coughing up of blood.No change in color of mucus.No wheezing. Skin:  no rash or lesions. No jaundice GU:  no change in color of urine, no urgency or frequency. No straining to urinate.  No flank pain.  Musculoskeletal:  No joint pain or no joint swelling. No decreased range of motion. No back pain.  Psych:  No change in mood or affect. No depression or anxiety. No memory loss.  Neuro: no localizing neurological complaints, no tingling, no weakness, no double vision, no gait abnormality, no slurred speech, no confusion  Otherwise ROS are negative except for above, 10 systems were reviewed  Past Medical History: Past Medical History  Diagnosis Date  . Anxiety   . Chronic obstructive pulmonary disease ([place])   . Depression   . Nicotine dependence   . Vitamin D deficiency   . Age-related osteoporosis without current pathological fracture   . Viral hepatitis C   . Exposure of implanted vaginal mesh and prosthetic material in vagina   . Hypertension   . GERD (gastroesophageal reflux disease)   . Uterus cancer [facility])    Past Surgical History  Procedure Laterality Date  . Cholecystectomy    . Tubal ligation    . Tonsillectomy    . Abdominal hysterectomy    . Bladder surgery    . Tumor removal      Nose  . Reconstruction of nose    . Neuroplasty / transposition median nerve at carpal tunnel       Medications: Prior to Admission medications   Medication Sig Start Date End Date Taking? Authorizing Provider  ALPRAZolam Duanne Moron) 0.5 MG tablet Take  0.5 mg by mouth 2 (two) times daily.   Yes Historical Provider, MD  aspirin EC 81 MG tablet Take 81 mg by mouth every 6 (six) hours as needed for mild pain.   Yes Historical Provider, MD  Oxycodone HCl 10 MG TABS Take 10 mg by mouth 4 (four) times daily.    Yes Historical Provider, MD    Allergies:     Allergies  Allergen Reactions  . Mucinex [Guaifenesin Er] Anaphylaxis  . Nsaids Anaphylaxis    Social History:  Ambulatory  Independently  Lives at home  With family     reports that she has been smoking Cigarettes.  She has never used smokeless tobacco. She reports that she does not drink alcohol or use illicit drugs.    Family History: family history includes Breast cancer in her mother; Diabetes in her mother; Liver cancer in her brother.    Physical Exam: Patient Vitals for the past 24 hrs:  BP Temp Temp src Pulse Resp SpO2  02/17/15 2345 120/60 mmHg - - - 15 -  02/17/15 2330 114/77 mmHg - - (!) 36 23 100 %  02/17/15 2315 (!) 113/50 mmHg - - 75 17 96 %  02/17/15 2240 (!) 81/67 mmHg - - 116 16 92 %  02/17/15 2149 91/71 mmHg 102.2 F (39 C) Oral 82 16 (!) 86 %    1. General: Frail-appearing actively vomiting during interview. Answering short sentences continues coughing 2. Psychological: Alert and  Oriented 3. Head/ENT:    Dry Mucous Membranes                          Head Non traumatic, neck supple                           Poor Dentition 4. SKIN:   decreased Skin turgor,  Skin clean Dry and intact no rash 5. Heart: Regular rate and rhythm no Murmur, Rub or gallop 6. Lungs:   some wheezes and occasional crackles   7. Abdomen: Soft, non-tender, Non distended 8. Lower extremities: no clubbing, cyanosis, or edema 9. Neurologically Grossly intact, moving all 4 extremities equally 10. MSK: Normal range of motion  body mass index is unknown because there is no weight on file.   Labs on Admission:   Results for orders placed or performed during the hospital encounter of 02/17/15 (from the past 24 hour(s))  I-Stat CG4 Lactic Acid, ED (Not at [facility])     Status: Abnormal   Collection Time: 02/17/15 10:18 PM  Result Value Ref Range   Lactic Acid, Venous 2.47 (HH) 0.5 - 2.0 mmol/L   Comment NOTIFIED PHYSICIAN   Urinalysis, Routine w reflex microscopic (not at [facility])      Status: Abnormal   Collection Time: 02/17/15 10:21 PM  Result Value Ref Range   Color, Urine AMBER (A) YELLOW   APPearance CLOUDY (A) CLEAR   Specific Gravity, Urine 1.022 1.005 - 1.030   pH 5.5 5.0 - 8.0   Glucose, UA NEGATIVE NEGATIVE mg/dL   Hgb urine dipstick SMALL (A) NEGATIVE   Bilirubin Urine MODERATE (A) NEGATIVE   Ketones, ur 40 (A) NEGATIVE mg/dL   Protein, ur 30 (A) NEGATIVE mg/dL   Nitrite NEGATIVE NEGATIVE   Leukocytes, UA TRACE (A) NEGATIVE  Urine microscopic-add on     Status: Abnormal   Collection Time: 02/17/15 10:21 PM  Result Value Ref Range   Squamous Epithelial /  LPF 0-5 (A) NONE SEEN   WBC, UA 6-30 0 - 5 WBC/hpf   RBC / HPF 6-30 0 - 5 RBC/hpf   Bacteria, UA FEW (A) NONE SEEN   Casts HYALINE CASTS (A) NEGATIVE   Urine-Other MUCOUS PRESENT   Comprehensive metabolic panel     Status: Abnormal   Collection Time: 02/17/15 10:22 PM  Result Value Ref Range   Sodium 139 135 - 145 mmol/L   Potassium 3.5 3.5 - 5.1 mmol/L   Chloride 99 (L) 101 - 111 mmol/L   CO2 24 22 - 32 mmol/L   Glucose, Bld 121 (H) 65 - 99 mg/dL   BUN 9 6 - 20 mg/dL   Creatinine, Ser 0.82 0.44 - 1.00 mg/dL   Calcium 8.4 (L) 8.9 - 10.3 mg/dL   Total Protein 6.9 6.5 - 8.1 g/dL   Albumin 2.3 (L) 3.5 - 5.0 g/dL   AST 13 (L) 15 - 41 U/L   ALT 6 (L) 14 - 54 U/L   Alkaline Phosphatase 175 (H) 38 - 126 U/L   Total Bilirubin 0.8 0.3 - 1.2 mg/dL   GFR calc non Af Amer >60 >60 mL/min   GFR calc Af Amer >60 >60 mL/min   Anion gap 16 (H) 5 - 15  CBC with Differential     Status: Abnormal   Collection Time: 02/17/15 10:22 PM  Result Value Ref Range   WBC 8.2 4.0 - 10.5 K/uL   RBC 3.26 (L) 3.87 - 5.11 MIL/uL   Hemoglobin 9.6 (L) 12.0 - 15.0 g/dL   HCT 30.5 (L) 36.0 - 46.0 %   MCV 93.6 78.0 - 100.0 fL   MCH 29.4 26.0 - 34.0 pg   MCHC 31.5 30.0 - 36.0 g/dL   RDW 15.6 (H) 11.5 - 15.5 %   Platelets 229 150 - 400 K/uL   Neutrophils Relative % 81 %   Neutro Abs 6.6 1.7 - 7.7 K/uL   Lymphocytes  Relative 8 %   Lymphs Abs 0.7 0.7 - 4.0 K/uL   Monocytes Relative 11 %   Monocytes Absolute 0.9 0.1 - 1.0 K/uL   Eosinophils Relative 0 %   Eosinophils Absolute 0.0 0.0 - 0.7 K/uL   Basophils Relative 0 %   Basophils Absolute 0.0 0.0 - 0.1 K/uL  I-Stat arterial blood gas, ED     Status: Abnormal   Collection Time: 02/18/15 12:34 AM  Result Value Ref Range   pH, Arterial 7.366 7.350 - 7.450   pCO2 arterial 39.7 35.0 - 45.0 mmHg   pO2, Arterial 68.0 (L) 80.0 - 100.0 mmHg   Bicarbonate 22.3 20.0 - 24.0 mEq/L   TCO2 23 0 - 100 mmol/L   O2 Saturation 91.0 %   Acid-base deficit 2.0 0.0 - 2.0 mmol/L   Patient temperature 102.0 F    Collection site RADIAL, ALLEN'S TEST ACCEPTABLE    Drawn by Operator    Sample type ARTERIAL     UA 6-10 white blood cells as well as 6-10 red blood cells with few bacteria  No results found for: HGBA1C  CrCl cannot be calculated (Unknown ideal weight.).  BNP (last 3 results) No results for input(s): PROBNP in the last 8760 hours.  Other results:  I have pearsonaly reviewed this: ECG REPORT  Rate: 106  Rhythm: Sinus rhythm with frequent PACs ST&T Change: ST depressions QTC 441  There were no vitals filed for this visit.   Cultures:    Component Value Date/Time   SDES URINE,  RANDOM 01/15/2010 2104   Muir ADDED 01/16/10 AT 0012 01/15/2010 2104   CULT  01/15/2010 2104    Multiple bacterial morphotypes present, none predominant. Suggest appropriate recollection if clinically indicated.   REPTSTATUS 01/17/2010 FINAL 01/15/2010 2104     Radiological Exams on Admission: Dg Chest 2 View  02/17/2015  CLINICAL DATA:  60 year old female with confusion and disorientation and fever. EXAM: CHEST  2 VIEW COMPARISON:  Chest radiograph dated 03/01/2012 FINDINGS: Two views of the chest demonstrate multiple patchy areas of increased opacity in the mid to lower right lower lung field compatible with pneumonia. There is a small right pleural effusion.  The left lung is clear. There is no pneumothorax. The cardiac silhouette is within normal limits. The osseous structures appear unremarkable. IMPRESSION: Pneumonia. Small right pleural effusion. Clinical correlation and follow-up recommended. Electronically Signed   By: Anner Crete M.D.   On: 02/17/2015 22:47    Chart has been reviewed  Family not  at  Bedside    Assessment/Plan  59 year old female history of chronic hepatitis C and COPD presents with three-week history of cough 1 week of intermittent fevers nausea vomiting chest pain induced by coughing cough productive of green sputum. Found to have evidence of pneumonia on chest x-ray and meeting sepsis criteria with elevated lactic acid and acute encephalopathy  Present on Admission:  . Sepsis ([place]) Will admit per sepsis protocol. Obtain serial lactic acid, fluid resuscitation as per protocol, check pro-calcitonin level, broad-spectrum antibiotics for community-acquired pneumonia coverage. Admit to step down will notify elink . UTI (lower urinary tract infection) dysuria and abnormal ua. should be covered rocephin await results of urine culture  . CAP (community acquired pneumonia) admit to step down. Treat as per community-acquired protocol. Add vancomycin given the patient has appears to be clinically ill. Check for Legionella. Check for influenza.  . Anemia obtain [place] panel denies any blood in stool  . Hep C w/o coma, chronic (HCC) chronic at this point no evidence of liver failure. Will need to be followed up as an outpatient Abnormal EKG - will cycle cardiac cancerous monitor on telemetry acute encephalopathy -  in the setting of sepsis most likely secondary to underlying infection. Will rehydrate and monitor and step down treat underlying causes Prophylaxis:   Lovenox   CODE STATUS:  FULL CODE   as per patient   Disposition:    To home once workup is complete and patient is stable  Other plan as per orders.  I have spent  a total of 55 min on this admission  October Peery 02/18/2015, 1:05 AM  [facility]  Pager [phone]   after 2 AM please page floor coverage PA If 7AM-7PM, please contact the day team taking care of the patient  [email or web address]  Password [email or web address]

## 2015-02-19 DIAGNOSIS — D509 Iron deficiency anemia, unspecified: Secondary | ICD-10-CM

## 2015-02-19 DIAGNOSIS — E43 Unspecified severe protein-calorie malnutrition: Secondary | ICD-10-CM | POA: Insufficient documentation

## 2015-02-19 LAB — BLOOD GAS, ARTERIAL
Acid-Base Excess: 1.3 mmol/L (ref 0.0–2.0)
BICARBONATE: 25.2 meq/L — AB (ref 20.0–24.0)
Drawn by: 365291
O2 Content: 3 L/min
O2 Saturation: 89.3 %
PCO2 ART: 38.4 mmHg (ref 35.0–45.0)
PH ART: 7.432 (ref 7.350–7.450)
PO2 ART: 59.6 mmHg — AB (ref 80.0–100.0)
Patient temperature: 98.6
TCO2: 26.4 mmol/L (ref 0–100)

## 2015-02-19 LAB — RENAL FUNCTION PANEL
ALBUMIN: 1.6 g/dL — AB (ref 3.5–5.0)
Anion gap: 11 (ref 5–15)
CHLORIDE: 105 mmol/L (ref 101–111)
CO2: 24 mmol/L (ref 22–32)
CREATININE: 0.34 mg/dL — AB (ref 0.44–1.00)
Calcium: 7.6 mg/dL — ABNORMAL LOW (ref 8.9–10.3)
Glucose, Bld: 113 mg/dL — ABNORMAL HIGH (ref 65–99)
PHOSPHORUS: 2.3 mg/dL — AB (ref 2.5–4.6)
POTASSIUM: 3.8 mmol/L (ref 3.5–5.1)
Sodium: 140 mmol/L (ref 135–145)

## 2015-02-19 LAB — CBC
HEMATOCRIT: 25.3 % — AB (ref 36.0–46.0)
Hemoglobin: 7.9 g/dL — ABNORMAL LOW (ref 12.0–15.0)
MCH: 29.8 pg (ref 26.0–34.0)
MCHC: 31.2 g/dL (ref 30.0–36.0)
MCV: 95.5 fL (ref 78.0–100.0)
PLATELETS: 158 10*3/uL (ref 150–400)
RBC: 2.65 MIL/uL — ABNORMAL LOW (ref 3.87–5.11)
RDW: 15.2 % (ref 11.5–15.5)
WBC: 3.8 10*3/uL — AB (ref 4.0–10.5)

## 2015-02-19 LAB — LIPID PANEL
CHOL/HDL RATIO: 6 ratio
CHOLESTEROL: 72 mg/dL (ref 0–200)
HDL: 12 mg/dL — ABNORMAL LOW (ref >40)
LDL Cholesterol: 28 mg/dL (ref 0–99)
Triglycerides: 161 mg/dL — ABNORMAL HIGH (ref <150)
VLDL: 32 mg/dL (ref 0–40)

## 2015-02-19 LAB — LEGIONELLA ANTIGEN, URINE

## 2015-02-19 LAB — URINE CULTURE: Culture: 7000

## 2015-02-19 LAB — AMMONIA: AMMONIA: 18 umol/L (ref 9–35)

## 2015-02-19 LAB — HIV ANTIBODY (ROUTINE TESTING W REFLEX): HIV Screen 4th Generation wRfx: NONREACTIVE

## 2015-02-19 LAB — MAGNESIUM: MAGNESIUM: 1.6 mg/dL — AB (ref 1.7–2.4)

## 2015-02-19 MED ORDER — POTASSIUM & SODIUM PHOSPHATES 280-160-250 MG PO PACK
1.0000 | PACK | Freq: Three times a day (TID) | ORAL | Status: DC
  Administered 2015-02-19 – 2015-02-23 (×14): 1 via ORAL
  Filled 2015-02-19 (×21): qty 1

## 2015-02-19 MED ORDER — LACTULOSE 10 GM/15ML PO SOLN
30.0000 g | Freq: Every day | ORAL | Status: DC
  Administered 2015-02-19 – 2015-02-20 (×2): 30 g via ORAL
  Filled 2015-02-19 (×2): qty 45

## 2015-02-19 MED ORDER — MAGNESIUM SULFATE 2 GM/50ML IV SOLN
2.0000 g | Freq: Once | INTRAVENOUS | Status: AC
  Administered 2015-02-19: 2 g via INTRAVENOUS
  Filled 2015-02-19: qty 50

## 2015-02-19 MED ORDER — FERROUS SULFATE 325 (65 FE) MG PO TABS
325.0000 mg | ORAL_TABLET | Freq: Two times a day (BID) | ORAL | Status: DC
  Administered 2015-02-19 – 2015-02-26 (×14): 325 mg via ORAL
  Filled 2015-02-19 (×15): qty 1

## 2015-02-19 MED ORDER — ENSURE ENLIVE PO LIQD
237.0000 mL | Freq: Three times a day (TID) | ORAL | Status: DC
  Administered 2015-02-19 – 2015-02-22 (×4): 237 mL via ORAL

## 2015-02-19 MED ORDER — MAGNESIUM CHLORIDE 64 MG PO TBEC
1.0000 | DELAYED_RELEASE_TABLET | Freq: Two times a day (BID) | ORAL | Status: DC
  Administered 2015-02-19 – 2015-02-26 (×14): 64 mg via ORAL
  Filled 2015-02-19 (×17): qty 1

## 2015-02-19 MED ORDER — ALPRAZOLAM 0.5 MG PO TABS
0.5000 mg | ORAL_TABLET | Freq: Two times a day (BID) | ORAL | Status: DC | PRN
  Administered 2015-02-19 – 2015-02-25 (×9): 0.5 mg via ORAL
  Filled 2015-02-19 (×9): qty 1

## 2015-02-19 NOTE — Progress Notes (Signed)
Initial Nutrition Assessment  DOCUMENTATION CODES:   Severe malnutrition in context of chronic illness, Underweight  INTERVENTION:   Ensure Enlive po TID, each supplement provides 350 kcal and 20 grams of protein  NUTRITION DIAGNOSIS:   Increased nutrient needs related to chronic illness as evidenced by estimated needs  GOAL:   Patient will meet greater than or equal to 90% of their needs  MONITOR:   PO intake, Supplement acceptance, Labs, Weight trends, I & O's  REASON FOR ASSESSMENT:   Consult Assessment of nutrition requirement/status  ASSESSMENT:   59 yo Female with PMH of COPD, depression, Vit D deficiency, osteoporosis; presented with generalized weakness some nausea and vomiting she was confused at the time of arrival to emergency department. Reports have been coughing for the past 3 weeks. For the past 1 week she's been having intermittent chest pains worse with coughing or shortness of breath. Patient also endorses some diarrhea and abdominal pain which may have been chronic. Patient also endorsed some dysuria.  RD spoke with patient; family at bedside.  Pt reports poor appetite x 2 -3 days.  She endorses weight loss, however, unable to quantify amount.  Would benefit from oral nutrition supplements.  Amemable to RD ordering for her during hospitalization.  Nutrition-Focused physical exam completed. Findings are severe fat depletion, severe muscle depletion, and no edema.   Diet Order:  Diet regular Room service appropriate?: Yes; Fluid consistency:: Thin  Skin:  Reviewed, no issues  Last BM:  12/26  Height:   Ht Readings from Last 1 Encounters:  02/18/15 '5\' 7"'$  (1.702 m)    Weight:   Wt Readings from Last 1 Encounters:  02/18/15 114 lb 10.2 oz (52 kg)    Ideal Body Weight:  61.3 kg  BMI:  Body mass index is 17.95 kg/(m^2).  Estimated Nutritional Needs:   Kcal:  1300-1500  Protein:  60-70 gm  Fluid:  >/= 1.5 L  EDUCATION NEEDS:   No education  needs identified at this time  Arthur Holms, RD, LDN Pager #: [phone] After-Hours Pager #: [phone]

## 2015-02-19 NOTE — Progress Notes (Signed)
[facility] PROGRESS NOTE  [name] QVZ:[id number] DOB: 05/07/1955 DOA: 02/17/2015 PCP: Lorelee Market, MD  Brief Summary  [name] is a 59 y.o. Female with history of Anxiety; Chronic obstructive pulmonary disease ([place]); Depression; Nicotine dependence, hepatitis C; Hypertension; GERD; and Uterus cancer ([place]) who presented with a several day history of generalized weakness, fevers, nausea and vomiting.  She had been coughing for 3 weeks. For the past 1 week she had intermittent chest pains worse with coughing or shortness of breath.  She has chronic diarrhea and abdominal pain. Patient also had some dysuria.  Emergency department patient was found to be hypoxic down to 84 on RA and have lactic acid of 2.46 or worrisome for pneumonia of small right pleural effusion she was febrile up to 102.2 within tachycardia to 116 meeting sepsis criteria  Hospitalist was called for admission for sepsis likely secondary to community-acquired pneumonia   Assessment/Plan  Sepsis and acute hypoxic respiratory failure secondary to right lower lobe pneumonia and acute COPD exacerbation -  Fevers resolved -  Blood cultures no growth to date -  Continue vancomycin, ceftriaxone -  Legionella and strep pneumo antigens negative -  Discontinue azithromycin -  Continue prednisone burst -  Add duo nebs -  Wean oxygen as tolerated, keeping O2 88-92%  Mild somnolence -  Check ABG and ammonia level:  Not retaining CO2 and ammonia level within normal limits -  Hold sedating medications and change Xanax to when necessary  UTI ruled out -  UCx with less than 7000 colonies, negative  Nausea and vomiting, only one episode of emesis  -  Continue antiemetics prn  Atypical chest pains are likely related to right sided pneumonia > pleuritic, right chest -  Tylenol prn  Mildly elevated troponins, likely demand ischemia from severe pneumonia.  Has ST segment depressions in the inferolateral leads  which appear similar from one ECG to next -  ASA allergy -  No BB due to wheezing -  LDL 28, however HDL 12, dyslipidemia and A1c pending -  Consider statin, however, she is pretty frail.  Will hold off for now -  ECHO:  Ejection fraction 65-70% with no regional wall motion abnormalities, peak PA pressure 41 mmHg -  Recommend outpatient stress test/cardiology consult after recovery from acute illness unless significant spike in troponins  Hepatitis C without coma, stable, f/u with ID or GI as outpatient  Iron deficiency anemia, folate, TSH, and vitamin B12 levels within normal limits -  Start iron supplementation -  Occult stool pending -  Repeat hgb in AM  Severe protein calorie malnutrition -  Appreciate Nutrition consultation -  Regular diet with supplements  Fever blisters, start magic mouthwash   Hypokalemia and hypomagnesemia. Hypokalemia resolved. IV magnesium followed by daily magnesium by mouth  Calcium corrects with albumin  Diet:  Regular Access:  PIV IVF:  Off Proph:  lovenox  Code Status: full Family Communication: patient alone Disposition Plan: pending improvem   Consultants:  None  Procedures:  ECHO  Antibiotics:  Vancomycin   Ceftriaxone  Azithromycin  HPI/Subjective:  Continues have pain in the right chest. Overall feels about the same as yesterday. No vomiting today.    Objective: Filed Vitals:   02/19/15 0816 02/19/15 0836 02/19/15 1219 02/19/15 1310  BP: 132/66   129/99  Pulse: 75 74 92 91  Temp: 97.5 F (36.4 C)   97.2 F (36.2 C)  TempSrc: Oral   Oral  Resp: 18 15 24  20  Height:      Weight:      SpO2: 96% 93% 94% 94%    Intake/Output Summary (Last 24 hours) at 02/19/15 1518 Last data filed at 02/19/15 1311  Gross per 24 hour  Intake   1690 ml  Output   1100 ml  Net    590 ml   Filed Weights   02/18/15 0800  Weight: 52 kg (114 lb 10.2 oz)   Body mass index is 17.95 kg/(m^2).  Exam:   General:  Adult female,  sleepy but arousable  HEENT:  NCAT, dry tongue  Cardiovascular:  RRR, nl S1, S2 no mrg, 2+ pulses, warm extremities  Respiratory:  Diminished breath sounds on right anterior chest and under right axilla, no rhonchi, wheeze, or rales, no increased WOB  Abdomen:   NABS, soft, NT/ND  MSK:   Normal tone and bulk, no LEE  Neuro:  Diffusely weak, slumped over in bed  Data Reviewed: Basic Metabolic Panel:  Recent Labs Lab 02/17/15 2222 02/18/15 1045 02/18/15 1633 02/19/15 0710  NA 139 140  --  140  K 3.5 2.4*  --  3.8  CL 99* 104  --  105  CO2 24 24  --  24  GLUCOSE 121* 120*  --  113*  BUN 9 <5*  --  <5*  CREATININE 0.82 0.51  --  0.34*  CALCIUM 8.4* 7.1*  --  7.6*  MG  --   --  1.5* 1.6*  PHOS  --   --  2.6 2.3*   Liver Function Tests:  Recent Labs Lab 02/17/15 2222 02/18/15 1045 02/19/15 0710  AST 13* 11*  --   ALT 6* 7*  --   ALKPHOS 175* 143*  --   BILITOT 0.8 0.5  --   PROT 6.9 5.6*  --   ALBUMIN 2.3* 1.7* 1.6*   No results for input(s): LIPASE, AMYLASE in the last 168 hours.  Recent Labs Lab 02/19/15 1115  AMMONIA 18   CBC:  Recent Labs Lab 02/17/15 2222 02/18/15 1045 02/19/15 1115  WBC 8.2 5.3 3.8*  NEUTROABS 6.6 4.2  --   HGB 9.6* 7.9* 7.9*  HCT 30.5* 24.7* 25.3*  MCV 93.6 94.3 95.5  PLT 229 149* 158    Recent Results (from the past 240 hour(s))  Culture, blood (routine x 2)     Status: None (Preliminary result)   Collection Time: 02/17/15  9:55 PM  Result Value Ref Range Status   Specimen Description BLOOD LEFT ARM  Final   Special Requests BOTTLES DRAWN AEROBIC AND ANAEROBIC 5ML  Final   Culture NO GROWTH 2 DAYS  Final   Report Status PENDING  Incomplete  Culture, blood (routine x 2)     Status: None (Preliminary result)   Collection Time: 02/17/15  9:59 PM  Result Value Ref Range Status   Specimen Description BLOOD RIGHT ARM  Final   Special Requests AEROBIC BOTTLE ONLY 5ML  Final   Culture NO GROWTH 2 DAYS  Final   Report  Status PENDING  Incomplete  Urine culture     Status: None   Collection Time: 02/17/15 10:21 PM  Result Value Ref Range Status   Specimen Description URINE, CLEAN CATCH  Final   Special Requests NONE  Final   Culture 7,000 COLONIES/mL INSIGNIFICANT GROWTH  Final   Report Status 02/19/2015 FINAL  Final  Culture, blood (single)     Status: None (Preliminary result)   Collection Time: 02/17/15 11:30 PM  Result Value Ref Range Status   Specimen Description BLOOD RIGHT ANTECUBITAL  Final   Special Requests BOTTLES DRAWN AEROBIC AND ANAEROBIC 5CC  Final   Culture NO GROWTH 1 DAY  Final   Report Status PENDING  Incomplete  MRSA PCR Screening     Status: None   Collection Time: 02/18/15  7:36 AM  Result Value Ref Range Status   MRSA by PCR NEGATIVE NEGATIVE Final    Comment:        The GeneXpert MRSA Assay (FDA approved for NASAL specimens only), is one component of a comprehensive MRSA colonization surveillance program. It is not intended to diagnose MRSA infection nor to guide or monitor treatment for MRSA infections.      Studies: Dg Chest 2 View  02/17/2015  CLINICAL DATA:  59 year old female with confusion and disorientation and fever. EXAM: CHEST  2 VIEW COMPARISON:  Chest radiograph dated 03/01/2012 FINDINGS: Two views of the chest demonstrate multiple patchy areas of increased opacity in the mid to lower right lower lung field compatible with pneumonia. There is a small right pleural effusion. The left lung is clear. There is no pneumothorax. The cardiac silhouette is within normal limits. The osseous structures appear unremarkable. IMPRESSION: Pneumonia. Small right pleural effusion. Clinical correlation and follow-up recommended. Electronically Signed   By: Anner Crete M.D.   On: 02/17/2015 22:47    Scheduled Meds: . antiseptic oral rinse  7 mL Mouth Rinse q12n4p  . azithromycin  500 mg Oral Daily  . cefTRIAXone (ROCEPHIN)  IV  1 g Intravenous Q24H  . chlorhexidine   15 mL Mouth Rinse BID  . enoxaparin (LOVENOX) injection  40 mg Subcutaneous Q24H  . feeding supplement (ENSURE ENLIVE)  237 mL Oral TID BM  . ferrous sulfate  325 mg Oral BID WC  . ipratropium-albuterol  3 mL Nebulization QID  . lactulose  30 g Oral Daily  . oxyCODONE  10 mg Oral TID AC & HS  . pneumococcal 23 valent vaccine  0.5 mL Intramuscular Tomorrow-1000  . predniSONE  50 mg Oral Q breakfast  . vancomycin  1,000 mg Intravenous BID   Continuous Infusions: . sodium chloride 1,000 mL (02/19/15 0245)    Active Problems:   Sepsis ([place])   UTI (lower urinary tract infection)   CAP (community acquired pneumonia)   Anemia   Hep C w/o coma, chronic (HCC)   Protein-calorie malnutrition, severe    Time spent: 30 min    Trellis Guirguis, [facility] Pager [phone]. If 7PM-7AM, please contact night-coverage at [email or web address], password [facility] 02/19/2015, 3:18 PM  LOS: 1 day

## 2015-02-20 LAB — HEPATIC FUNCTION PANEL
ALT: 19 U/L (ref 14–54)
AST: 59 U/L — AB (ref 15–41)
Albumin: 1.8 g/dL — ABNORMAL LOW (ref 3.5–5.0)
Alkaline Phosphatase: 111 U/L (ref 38–126)
TOTAL PROTEIN: 5.4 g/dL — AB (ref 6.5–8.1)
Total Bilirubin: 0.5 mg/dL (ref 0.3–1.2)

## 2015-02-20 LAB — PREPARE RBC (CROSSMATCH)

## 2015-02-20 LAB — RENAL FUNCTION PANEL
ALBUMIN: 1.5 g/dL — AB (ref 3.5–5.0)
ANION GAP: 10 (ref 5–15)
BUN: 6 mg/dL (ref 6–20)
CALCIUM: 7.6 mg/dL — AB (ref 8.9–10.3)
CO2: 25 mmol/L (ref 22–32)
CREATININE: 0.41 mg/dL — AB (ref 0.44–1.00)
Chloride: 105 mmol/L (ref 101–111)
GFR calc Af Amer: >60 mL/min (ref >60)
Glucose, Bld: 107 mg/dL — ABNORMAL HIGH (ref 65–99)
POTASSIUM: 3.5 mmol/L (ref 3.5–5.1)
Phosphorus: 2 mg/dL — ABNORMAL LOW (ref 2.5–4.6)
SODIUM: 140 mmol/L (ref 135–145)

## 2015-02-20 LAB — CBC
HEMATOCRIT: 21.9 % — AB (ref 36.0–46.0)
Hemoglobin: 6.6 g/dL — CL (ref 12.0–15.0)
MCH: 28.6 pg (ref 26.0–34.0)
MCHC: 30.1 g/dL (ref 30.0–36.0)
MCV: 94.8 fL (ref 78.0–100.0)
Platelets: 182 10*3/uL (ref 150–400)
RBC: 2.31 MIL/uL — AB (ref 3.87–5.11)
RDW: 15.3 % (ref 11.5–15.5)
WBC: 4.2 10*3/uL (ref 4.0–10.5)

## 2015-02-20 LAB — EXPECTORATED SPUTUM ASSESSMENT W GRAM STAIN, RFLX TO RESP C

## 2015-02-20 LAB — ABO/RH: ABO/RH(D): O NEG

## 2015-02-20 LAB — PROTIME-INR
INR: 1.14 (ref 0.00–1.49)
PROTHROMBIN TIME: 14.8 s (ref 11.6–15.2)

## 2015-02-20 LAB — EXPECTORATED SPUTUM ASSESSMENT W REFEX TO RESP CULTURE: SPECIAL REQUESTS: NORMAL

## 2015-02-20 LAB — MAGNESIUM: Magnesium: 1.9 mg/dL (ref 1.7–2.4)

## 2015-02-20 LAB — HEMOGLOBIN AND HEMATOCRIT, BLOOD
HEMATOCRIT: 28.1 % — AB (ref 36.0–46.0)
Hemoglobin: 9.1 g/dL — ABNORMAL LOW (ref 12.0–15.0)

## 2015-02-20 LAB — APTT: aPTT: 32 seconds (ref 24–37)

## 2015-02-20 LAB — VANCOMYCIN, TROUGH: VANCOMYCIN TR: 18 ug/mL (ref 10.0–20.0)

## 2015-02-20 LAB — LACTATE DEHYDROGENASE: LDH: 182 U/L (ref 98–192)

## 2015-02-20 MED ORDER — ATORVASTATIN CALCIUM 40 MG PO TABS
40.0000 mg | ORAL_TABLET | Freq: Every day | ORAL | Status: DC
  Administered 2015-02-20 – 2015-02-25 (×6): 40 mg via ORAL
  Filled 2015-02-20 (×6): qty 1

## 2015-02-20 MED ORDER — FLUCONAZOLE 100 MG PO TABS
200.0000 mg | ORAL_TABLET | Freq: Every day | ORAL | Status: DC
  Administered 2015-02-20 – 2015-02-24 (×5): 200 mg via ORAL
  Filled 2015-02-20: qty 2
  Filled 2015-02-20: qty 1
  Filled 2015-02-20 (×2): qty 2
  Filled 2015-02-20: qty 1

## 2015-02-20 MED ORDER — SODIUM CHLORIDE 0.9 % IV SOLN
Freq: Once | INTRAVENOUS | Status: AC
  Administered 2015-02-25: 500 mL via INTRAVENOUS

## 2015-02-20 NOTE — Progress Notes (Signed)
[facility] PROGRESS NOTE  [name] TOI:[id number] DOB: 07-19-1955 DOA: 02/17/2015 PCP: Lorelee Market, MD  Brief Summary  [name] is a 59 y.o. Female with history of Anxiety; Chronic obstructive pulmonary disease ([place]); Depression; Nicotine dependence, hepatitis C; Hypertension; GERD; and Uterus cancer ([place]) who presented with a several day history of generalized weakness, fevers, nausea and vomiting.  She had been coughing for 3 weeks. For the past 1 week she had intermittent chest pains worse with coughing or shortness of breath.  She has chronic diarrhea and abdominal pain. Patient also had some dysuria.  Emergency department patient was found to be hypoxic down to 84 on RA and have lactic acid of 2.46 or worrisome for pneumonia of small right pleural effusion she was febrile up to 102.2 within tachycardia to 116 meeting sepsis criteria  Hospitalist was called for admission for sepsis likely secondary to community-acquired pneumonia   Assessment/Plan  Sepsis and acute hypoxic respiratory failure secondary to right lower lobe pneumonia and acute COPD exacerbation -  Fevers resolved -  Blood cultures no growth to date -  Continue vancomycin, ceftriaxone -  Legionella and strep pneumo antigens negative -  Discontinue azithromycin -  Continue prednisone burst -  Continue duo nebs -  Wean oxygen as tolerated, keeping O2 88-92%  Mild somnolence, resolved.  ABG without hypercapnea and ammonia level wnl -  Hold sedating medications and but Xanax prn   UTI ruled out, UCx with less than 7000 colonies, negative  Nausea and vomiting, only one episode of emesis -  Continue antiemetics prn  Atypical chest pains are likely related to right sided pneumonia -  Tylenol prn  Mildly elevated troponins, likely demand ischemia from severe pneumonia.  Has ST segment depressions in the inferolateral leads which appear similar from one ECG to next -  ASA allergy -  No BB due  to wheezing -  LDL 28, however HDL 12, dyslipidemia and A1c pending -  Start atorvastatin -  ECHO:  Ejection fraction 65-70% with no regional wall motion abnormalities, peak PA pressure 41 mmHg -  Recommend outpatient stress test/cardiology consult after recovery from acute illness unless significant spike in troponins  Hepatitis C without coma, stable, f/u with ID or GI as outpatient  Iron deficiency anemia, folate, TSH, and vitamin B12 levels within normal limits, hgb dropped overnight.  Had 2 bowel movements recorded but no documentation that they were bloody. Occult stool was never obtained. -  Transfuse 1 unit PRBCs -  Continue iron supplementation -  Follow-up H&H posttransfusion, goal hemoglobin greater than 7  Severe protein calorie malnutrition -  Appreciate Nutrition consultation -  Regular diet with supplements  Thrush, continue magic mouthwash -  Add fluconazole   Hypokalemia and hypomagnesemia. Hypokalemia resolved. IV magnesium followed by daily magnesium by mouth  Calcium corrects with albumin  Diet:  Regular Access:  PIV IVF:  Off Proph:  lovenox  Code Status: full Family Communication: patient alone Disposition Plan: pending stable hemoglobin, breathing improved, PT evaluation   Consultants:  None  Procedures:  ECHO  Antibiotics:  Vancomycin   Ceftriaxone  Azithromycin  HPI/Subjective:  Pain in right chest has improved, breathing easier. Magic mouthwash and able to tolerate a little bit of lunch and dinner yesterday.  She is unsure if she had any blood in her stools overnight.  She denies abdominal pain. She had some emesis that had streaks of blood in it. Denies coffee-ground emesis.    Objective: Filed Vitals:  02/20/15 0000 02/20/15 0400 02/20/15 0815 02/20/15 0900  BP: 152/77 92/59 126/75 111/55  Pulse: 67 70 89 66  Temp: 98.2 F (36.8 C) 97.8 F (36.6 C) 97.7 F (36.5 C)   TempSrc: Oral Oral Oral   Resp: '16 14 19 21  '$ Height:       Weight:      SpO2: 97% 96% 95% 94%    Intake/Output Summary (Last 24 hours) at 02/20/15 0947 Last data filed at 02/20/15 0700  Gross per 24 hour  Intake    860 ml  Output    275 ml  Net    585 ml   Filed Weights   02/18/15 0800  Weight: 52 kg (114 lb 10.2 oz)   Body mass index is 17.95 kg/(m^2).  Exam:   General:  Adult female,  awake alert, appears improved from yesterday  HEENT:  NCAT,  white plaques on tongue and soft palate  Cardiovascular:  RRR, nl S1, S2 no mrg, 2+ pulses, warm extremities  Respiratory:  Diminished breath sounds on right anterior chest and under right axilla, no rhonchi, wheeze, or rales, no increased WOB  Abdomen:   NABS, soft, NT/ND  MSK:   Normal tone and bulk, no LEE  Neuro:  Diffusely weak, frail-appearing   Data Reviewed: Basic Metabolic Panel:  Recent Labs Lab 02/17/15 2222 02/18/15 1045 02/18/15 1633 02/19/15 0710 02/20/15 0424  NA 139 140  --  140 140  K 3.5 2.4*  --  3.8 3.5  CL 99* 104  --  105 105  CO2 24 24  --  24 25  GLUCOSE 121* 120*  --  113* 107*  BUN 9 <5*  --  <5* 6  CREATININE 0.82 0.51  --  0.34* 0.41*  CALCIUM 8.4* 7.1*  --  7.6* 7.6*  MG  --   --  1.5* 1.6* 1.9  PHOS  --   --  2.6 2.3* 2.0*   Liver Function Tests:  Recent Labs Lab 02/17/15 2222 02/18/15 1045 02/19/15 0710 02/20/15 0424  AST 13* 11*  --   --   ALT 6* 7*  --   --   ALKPHOS 175* 143*  --   --   BILITOT 0.8 0.5  --   --   PROT 6.9 5.6*  --   --   ALBUMIN 2.3* 1.7* 1.6* 1.5*   No results for input(s): LIPASE, AMYLASE in the last 168 hours.  Recent Labs Lab 02/19/15 1115  AMMONIA 18   CBC:  Recent Labs Lab 02/17/15 2222 02/18/15 1045 02/19/15 1115 02/20/15 0424  WBC 8.2 5.3 3.8* 4.2  NEUTROABS 6.6 4.2  --   --   HGB 9.6* 7.9* 7.9* 6.6*  HCT 30.5* 24.7* 25.3* 21.9*  MCV 93.6 94.3 95.5 94.8  PLT 229 149* 158 182    Recent Results (from the past 240 hour(s))  Culture, blood (routine x 2)     Status: None (Preliminary  result)   Collection Time: 02/17/15  9:55 PM  Result Value Ref Range Status   Specimen Description BLOOD LEFT ARM  Final   Special Requests BOTTLES DRAWN AEROBIC AND ANAEROBIC 5ML  Final   Culture NO GROWTH 3 DAYS  Final   Report Status PENDING  Incomplete  Culture, blood (routine x 2)     Status: None (Preliminary result)   Collection Time: 02/17/15  9:59 PM  Result Value Ref Range Status   Specimen Description BLOOD RIGHT ARM  Final   Special Requests  AEROBIC BOTTLE ONLY 5ML  Final   Culture NO GROWTH 3 DAYS  Final   Report Status PENDING  Incomplete  Urine culture     Status: None   Collection Time: 02/17/15 10:21 PM  Result Value Ref Range Status   Specimen Description URINE, CLEAN CATCH  Final   Special Requests NONE  Final   Culture 7,000 COLONIES/mL INSIGNIFICANT GROWTH  Final   Report Status 02/19/2015 FINAL  Final  Culture, blood (single)     Status: None (Preliminary result)   Collection Time: 02/17/15 11:30 PM  Result Value Ref Range Status   Specimen Description BLOOD RIGHT ANTECUBITAL  Final   Special Requests BOTTLES DRAWN AEROBIC AND ANAEROBIC 5CC  Final   Culture NO GROWTH 2 DAYS  Final   Report Status PENDING  Incomplete  MRSA PCR Screening     Status: None   Collection Time: 02/18/15  7:36 AM  Result Value Ref Range Status   MRSA by PCR NEGATIVE NEGATIVE Final    Comment:        The GeneXpert MRSA Assay (FDA approved for NASAL specimens only), is one component of a comprehensive MRSA colonization surveillance program. It is not intended to diagnose MRSA infection nor to guide or monitor treatment for MRSA infections.   Culture, sputum-assessment     Status: None   Collection Time: 02/19/15  8:51 PM  Result Value Ref Range Status   Specimen Description EXPECTORATED SPUTUM  Final   Special Requests Normal  Final   Sputum evaluation   Final    THIS SPECIMEN IS ACCEPTABLE. RESPIRATORY CULTURE REPORT TO FOLLOW.   Report Status 02/20/2015 FINAL  Final      Studies: No results found.  Scheduled Meds: . sodium chloride   Intravenous Once  . antiseptic oral rinse  7 mL Mouth Rinse q12n4p  . cefTRIAXone (ROCEPHIN)  IV  1 g Intravenous Q24H  . chlorhexidine  15 mL Mouth Rinse BID  . enoxaparin (LOVENOX) injection  40 mg Subcutaneous Q24H  . feeding supplement (ENSURE ENLIVE)  237 mL Oral TID BM  . ferrous sulfate  325 mg Oral BID WC  . ipratropium-albuterol  3 mL Nebulization QID  . lactulose  30 g Oral Daily  . magnesium chloride  1 tablet Oral BID  . oxyCODONE  10 mg Oral TID AC & HS  . pneumococcal 23 valent vaccine  0.5 mL Intramuscular Tomorrow-1000  . potassium & sodium phosphates  1 packet Oral TID WC & HS  . predniSONE  50 mg Oral Q breakfast  . vancomycin  1,000 mg Intravenous BID   Continuous Infusions:    Active Problems:   Sepsis ([place])   UTI (lower urinary tract infection)   CAP (community acquired pneumonia)   Anemia   Hep C w/o coma, chronic ([place])   Protein-calorie malnutrition, severe    Time spent: 30 min    Nyle Limb, [place] [facility] Pager [phone]. If 7PM-7AM, please contact night-coverage at [email or web address], password [facility] 02/20/2015, 9:47 AM  LOS: 2 days

## 2015-02-20 NOTE — Progress Notes (Signed)
Pharmacy Antibiotic Follow-up Note  [name] is a 59 y.o. year-old female admitted on 02/17/2015.  The patient is currently on day 3 of vancomycin and ceftriaxone for PNA.  Patient's renal function has been stable and vancomycin trough is therapeutic.  Plan: - Continue vanc 1gm IV Q12H - Rocephin 1gm IV Q24H per MD - Monitor renal fxn, abx LOT, repeat VT next week to r/o accumulation   Temp (24hrs), Avg:97.9 F (36.6 C), Min:97.2 F (36.2 C), Max:98.9 F (37.2 C)   Recent Labs Lab 02/17/15 2222 02/18/15 1045 02/19/15 1115 02/20/15 0424  WBC 8.2 5.3 3.8* 4.2    Recent Labs Lab 02/17/15 2222 02/18/15 1045 02/19/15 0710 02/20/15 0424  CREATININE 0.82 0.51 0.34* 0.41*   Estimated Creatinine Clearance: 60.6 mL/min (by C-G formula based on Cr of 0.41).    Allergies  Allergen Reactions  . Mucinex [Guaifenesin Er] Anaphylaxis  . Nsaids Anaphylaxis    Antimicrobials this admission: Vanc 12/29 >> CTX 12/29 >> Azith 12/29 >> 12/30  Levels/dose changes this admission: 12/31 VT = 18 mcg/mL on 1gm q12  Microbiology results: 12/29 MRSA PCR >>NEG 12/29 Sputum >>pending 12/29 Blood >>NGTD 12/29 Urine >>pending 12/29 Legionella Ag>>pending 12/29 Strep pneumo Ag>>NEG 12/29 Flu PCR - negative    Shalay Carder D. Mina Marble, PharmD, BCPS Pager:  [phone] 02/20/2015, 11:02 AM

## 2015-02-20 NOTE — Progress Notes (Signed)
CRITICAL VALUE ALERT  Critical value received:  HGB 6.6  Date of notification:  02/20/2015  Time of notification:  0518  Critical value read back: yes  Nurse who received alert: Gevena Barre, RN  MD notified (1st page): Kathline Magic, NP  Time of first page: [phone] texted  MD notified (2nd page):  Time of second page:  Responding MD: Kathline Magic, NP  Time MD responded:  [phone] at bedside

## 2015-02-21 ENCOUNTER — Encounter (HOSPITAL_COMMUNITY): Payer: Self-pay

## 2015-02-21 DIAGNOSIS — G8929 Other chronic pain: Secondary | ICD-10-CM

## 2015-02-21 DIAGNOSIS — J9601 Acute respiratory failure with hypoxia: Secondary | ICD-10-CM

## 2015-02-21 DIAGNOSIS — I714 Abdominal aortic aneurysm, without rupture, unspecified: Secondary | ICD-10-CM

## 2015-02-21 LAB — TYPE AND SCREEN
ABO/RH(D): O NEG
Antibody Screen: NEGATIVE
Unit division: 0

## 2015-02-21 LAB — HAPTOGLOBIN: HAPTOGLOBIN: 503 mg/dL — AB (ref 34–200)

## 2015-02-21 LAB — CBC
HCT: 25.6 % — ABNORMAL LOW (ref 36.0–46.0)
Hemoglobin: 8.2 g/dL — ABNORMAL LOW (ref 12.0–15.0)
MCH: 29 pg (ref 26.0–34.0)
MCHC: 32 g/dL (ref 30.0–36.0)
MCV: 90.5 fL (ref 78.0–100.0)
PLATELETS: 208 10*3/uL (ref 150–400)
RBC: 2.83 MIL/uL — ABNORMAL LOW (ref 3.87–5.11)
RDW: 18.2 % — AB (ref 11.5–15.5)
WBC: 4.4 10*3/uL (ref 4.0–10.5)

## 2015-02-21 LAB — RENAL FUNCTION PANEL
Albumin: 1.7 g/dL — ABNORMAL LOW (ref 3.5–5.0)
Anion gap: 11 (ref 5–15)
CO2: 28 mmol/L (ref 22–32)
CREATININE: 0.45 mg/dL (ref 0.44–1.00)
Calcium: 8 mg/dL — ABNORMAL LOW (ref 8.9–10.3)
Chloride: 101 mmol/L (ref 101–111)
GFR calc Af Amer: >60 mL/min (ref >60)
Glucose, Bld: 94 mg/dL (ref 65–99)
PHOSPHORUS: 2.6 mg/dL (ref 2.5–4.6)
POTASSIUM: 2.7 mmol/L — AB (ref 3.5–5.1)
SODIUM: 140 mmol/L (ref 135–145)

## 2015-02-21 LAB — MAGNESIUM: MAGNESIUM: 1.7 mg/dL (ref 1.7–2.4)

## 2015-02-21 MED ORDER — LACTULOSE 10 GM/15ML PO SOLN: 10.0000 g | Freq: Every day | ORAL | Status: DC | PRN

## 2015-02-21 MED ORDER — MIRTAZAPINE 15 MG PO TABS
7.5000 mg | ORAL_TABLET | Freq: Every day | ORAL | Status: DC
  Administered 2015-02-21 – 2015-02-25 (×5): 7.5 mg via ORAL
  Filled 2015-02-21 (×6): qty 1

## 2015-02-21 MED ORDER — MAGNESIUM SULFATE 2 GM/50ML IV SOLN
2.0000 g | Freq: Once | INTRAVENOUS | Status: AC
  Administered 2015-02-21: 2 g via INTRAVENOUS
  Filled 2015-02-21: qty 50

## 2015-02-21 MED ORDER — OXYCODONE HCL 5 MG PO TABS
10.0000 mg | ORAL_TABLET | Freq: Two times a day (BID) | ORAL | Status: DC | PRN
  Administered 2015-02-22 – 2015-02-26 (×5): 10 mg via ORAL
  Filled 2015-02-21 (×5): qty 2

## 2015-02-21 MED ORDER — POTASSIUM CHLORIDE 10 MEQ/100ML IV SOLN
10.0000 meq | INTRAVENOUS | Status: AC
  Administered 2015-02-21 (×2): 10 meq via INTRAVENOUS
  Filled 2015-02-21 (×2): qty 100

## 2015-02-21 MED ORDER — POTASSIUM CHLORIDE CRYS ER 20 MEQ PO TBCR
40.0000 meq | EXTENDED_RELEASE_TABLET | ORAL | Status: AC
  Administered 2015-02-21 (×2): 40 meq via ORAL
  Filled 2015-02-21 (×2): qty 2

## 2015-02-21 NOTE — Progress Notes (Signed)
Pt says she smoked most of her life. Asked pt if she thinks a nicotine patch would help her but she declined. Room smells of cigarette smoke - husband slept here overnight & reminded that there is no smoking in the hospital. Smoke  smell appears to be coming from [employer] clothing

## 2015-02-21 NOTE — Progress Notes (Signed)
Pt transfer to floor at 1850. Pt assessed. Will ctm

## 2015-02-21 NOTE — Progress Notes (Signed)
Pt transferred to [address] with all belongings Accompanied by Nurse tech per W/C.

## 2015-02-21 NOTE — Progress Notes (Signed)
Ms. Rieves family is concerned about her elevated blood pressure (157/72) due to her aortic aneurysm reported at 9 cm by husband.

## 2015-02-21 NOTE — Progress Notes (Signed)
PT Cancellation Note  Patient Details Name: [name] MRN: [id number] DOB: 1955/06/04   Cancelled Treatment:    Reason Eval/Treat Not Completed: Patient not medically ready.  Pt indicates SOB just trying to eat lunch and asked PT to come back another time.  Will f/u as appropriate.     Aesha Agrawal, [place] Papas 02/21/2015, 1:17 PM

## 2015-02-21 NOTE — Progress Notes (Signed)
Called report to [address] [place] spoke to [employer] will transfer in a w/c with [employer] certified [employer].

## 2015-02-21 NOTE — Progress Notes (Signed)
[facility] PROGRESS NOTE  LYNDA WANNINGER JAS:[id number] DOB: 02/03/1956 DOA: 02/17/2015 PCP: Lorelee Market, MD  Brief Summary  [name] is a 60 y.o. Female with history of Anxiety; Chronic obstructive pulmonary disease ([place]); Depression; Nicotine dependence, hepatitis C; Hypertension; GERD; and Uterus cancer ([place]) who presented with a several day history of generalized weakness, fevers, nausea and vomiting.  She had been coughing for 3 weeks. For the past 1 week she had intermittent chest pains worse with coughing or shortness of breath.  She has chronic diarrhea and abdominal pain. Patient also had some dysuria.  Emergency department patient was found to be hypoxic down to 84 on RA and have lactic acid of 2.46 or worrisome for pneumonia of small right pleural effusion she was febrile up to 102.2 within tachycardia to 116 meeting sepsis criteria  Hospitalist was called for admission for sepsis likely secondary to community-acquired pneumonia   Assessment/Plan  Sepsis and acute hypoxic respiratory failure secondary to right lower lobe pneumonia and acute COPD exacerbation -  Fevers resolved -  Blood cultures no growth to date -  D/c vancomycin -  Continue ceftriaxone -  Legionella and strep pneumo antigens negative -  Continue prednisone burst -  Continue duo nebs -  On room air  Mild somnolence, resolved.  ABG without hypercapnea and ammonia level wnl -  Hold sedating medications and but Xanax prn   UTI ruled out, UCx with less than 7000 colonies, negative  Nausea and vomiting, only one episode of emesis, resolved  Atypical chest pains are likely related to right sided pneumonia, persistent.  Not improved with home oxycodone regimen. -  Add additional prn oxycodone doses -  NSAID allergy -  Liver cirrhosis   Mildly elevated troponins, likely demand ischemia from severe pneumonia.  Has ST segment depressions in the inferolateral leads which appear similar from  one ECG to next -  ASA allergy -  No BB due to wheezing -  LDL 28, however HDL 12, dyslipidemia and A1c pending  -  Start atorvastatin -  ECHO:  Ejection fraction 65-70% with no regional wall motion abnormalities, peak PA pressure 41 mmHg -  Recommend outpatient stress test/cardiology consult after recovery from acute illness unless significant spike in troponins  Hepatitis C without coma, stable, f/u with ID or GI as outpatient  Iron deficiency anemia, folate, TSH, and vitamin B12 levels within normal limits, hgb dropped overnight.  Had 2 bowel movements recorded but no documentation that they were bloody. Occult stool was never obtained. -  Transfuse 1 unit PRBCs -  Continue iron supplementation -  Follow-up H&H posttransfusion, goal hemoglobin greater than 7  Severe protein calorie malnutrition -  Appreciate Nutrition consultation -  Regular diet with supplements -  Trial of remeron at night  Thrush, continue magic mouthwash -  Added fluconazole 12/31  Hypokalemia and hypomagnesemia. Hypokalemia recurrent -  Oral and IV potassium -  Additional IV magnesium and continue magnesium by mouth  Calcium corrects with albumin  Anxiety, insomnia, malnutrition -  Start low dose remeron  -  Continue xanax   AAA, managed by vascular surgery as outpatient    Diet:  Regular Access:  PIV IVF:  Off Proph:  lovenox  Code Status: full Family Communication: patient and her husband Disposition Plan:  declining PT evaluation   Consultants:  None  Procedures:  ECHO  Antibiotics:  Vancomycin   Ceftriaxone  Azithromycin  HPI/Subjective:  Persistent pain in right chest. Magic mouthwash helping.  No obvious bleeding.  Still SOB.    Objective: Filed Vitals:   02/21/15 0800 02/21/15 1005 02/21/15 1200 02/21/15 1447  BP: 125/53  164/71   Pulse: 70  73   Temp: 97.7 F (36.5 C)  98.5 F (36.9 C)   TempSrc: Oral  Oral   Resp: 18  14   Height:      Weight:      SpO2:  93% 97% 93% 96%    Intake/Output Summary (Last 24 hours) at 02/21/15 1524 Last data filed at 02/21/15 0900  Gross per 24 hour  Intake    730 ml  Output    200 ml  Net    530 ml   Filed Weights   02/18/15 0800  Weight: 52 kg (114 lb 10.2 oz)   Body mass index is 17.95 kg/(m^2).  Exam:   General:  Adult female,  awake alert  HEENT:  NCAT,  white plaques on tongue and soft palate  Cardiovascular:  RRR, nl S1, S2 no mrg, 2+ pulses, warm extremities  Respiratory:  Rales and better aeration on the right chest, decreased wheeze, + rhonchi   Abdomen:   NABS, soft, NT/ND  MSK:   Normal tone and bulk, no LEE  Neuro:  Diffusely weak, frail-appearing   Data Reviewed: Basic Metabolic Panel:  Recent Labs Lab 02/17/15 2222 02/18/15 1045 02/18/15 1633 02/19/15 0710 02/20/15 0424 02/21/15 0527  NA 139 140  --  140 140 140  K 3.5 2.4*  --  3.8 3.5 2.7*  CL 99* 104  --  105 105 101  CO2 24 24  --  '24 25 28  '$ GLUCOSE 121* 120*  --  113* 107* 94  BUN 9 <5*  --  <5* 6 <5*  CREATININE 0.82 0.51  --  0.34* 0.41* 0.45  CALCIUM 8.4* 7.1*  --  7.6* 7.6* 8.0*  MG  --   --  1.5* 1.6* 1.9 1.7  PHOS  --   --  2.6 2.3* 2.0* 2.6   Liver Function Tests:  Recent Labs Lab 02/17/15 2222 02/18/15 1045 02/19/15 0710 02/20/15 0424 02/20/15 1911 02/21/15 0527  AST 13* 11*  --   --  59*  --   ALT 6* 7*  --   --  19  --   ALKPHOS 175* 143*  --   --  111  --   BILITOT 0.8 0.5  --   --  0.5  --   PROT 6.9 5.6*  --   --  5.4*  --   ALBUMIN 2.3* 1.7* 1.6* 1.5* 1.8* 1.7*   No results for input(s): LIPASE, AMYLASE in the last 168 hours.  Recent Labs Lab 02/19/15 1115  AMMONIA 18   CBC:  Recent Labs Lab 02/17/15 2222 02/18/15 1045 02/19/15 1115 02/20/15 0424 02/20/15 1911 02/21/15 0527  WBC 8.2 5.3 3.8* 4.2  --  4.4  NEUTROABS 6.6 4.2  --   --   --   --   HGB 9.6* 7.9* 7.9* 6.6* 9.1* 8.2*  HCT 30.5* 24.7* 25.3* 21.9* 28.1* 25.6*  MCV 93.6 94.3 95.5 94.8  --  90.5  PLT 229  149* 158 182  --  208    Recent Results (from the past 240 hour(s))  Culture, blood (routine x 2)     Status: None (Preliminary result)   Collection Time: 02/17/15  9:55 PM  Result Value Ref Range Status   Specimen Description BLOOD LEFT ARM  Final   Special Requests  BOTTLES DRAWN AEROBIC AND ANAEROBIC 5ML  Final   Culture NO GROWTH 4 DAYS  Final   Report Status PENDING  Incomplete  Culture, blood (routine x 2)     Status: None (Preliminary result)   Collection Time: 02/17/15  9:59 PM  Result Value Ref Range Status   Specimen Description BLOOD RIGHT ARM  Final   Special Requests AEROBIC BOTTLE ONLY 5ML  Final   Culture NO GROWTH 4 DAYS  Final   Report Status PENDING  Incomplete  Urine culture     Status: None   Collection Time: 02/17/15 10:21 PM  Result Value Ref Range Status   Specimen Description URINE, CLEAN CATCH  Final   Special Requests NONE  Final   Culture 7,000 COLONIES/mL INSIGNIFICANT GROWTH  Final   Report Status 02/19/2015 FINAL  Final  Culture, blood (single)     Status: None (Preliminary result)   Collection Time: 02/17/15 11:30 PM  Result Value Ref Range Status   Specimen Description BLOOD RIGHT ANTECUBITAL  Final   Special Requests BOTTLES DRAWN AEROBIC AND ANAEROBIC 5CC  Final   Culture NO GROWTH 3 DAYS  Final   Report Status PENDING  Incomplete  MRSA PCR Screening     Status: None   Collection Time: 02/18/15  7:36 AM  Result Value Ref Range Status   MRSA by PCR NEGATIVE NEGATIVE Final    Comment:        The GeneXpert MRSA Assay (FDA approved for NASAL specimens only), is one component of a comprehensive MRSA colonization surveillance program. It is not intended to diagnose MRSA infection nor to guide or monitor treatment for MRSA infections.   Culture, sputum-assessment     Status: None   Collection Time: 02/19/15  8:51 PM  Result Value Ref Range Status   Specimen Description EXPECTORATED SPUTUM  Final   Special Requests Normal  Final   Sputum  evaluation   Final    THIS SPECIMEN IS ACCEPTABLE. RESPIRATORY CULTURE REPORT TO FOLLOW.   Report Status 02/20/2015 FINAL  Final  Culture, respiratory (NON-Expectorated)     Status: None (Preliminary result)   Collection Time: 02/19/15  8:51 PM  Result Value Ref Range Status   Specimen Description EXPECTORATED SPUTUM  Final   Special Requests NONE  Final   Gram Stain   Final    FEW WBC PRESENT, PREDOMINANTLY PMN RARE SQUAMOUS EPITHELIAL CELLS PRESENT FEW GRAM NEGATIVE RODS FEW GRAM POSITIVE COCCI IN PAIRS FEW GRAM NEGATIVE COCCI    Culture   Final    Culture reincubated for better growth Performed at [facility]    Report Status PENDING  Incomplete     Studies: No results found.  Scheduled Meds: . sodium chloride   Intravenous Once  . antiseptic oral rinse  7 mL Mouth Rinse q12n4p  . atorvastatin  40 mg Oral q1800  . cefTRIAXone (ROCEPHIN)  IV  1 g Intravenous Q24H  . chlorhexidine  15 mL Mouth Rinse BID  . enoxaparin (LOVENOX) injection  40 mg Subcutaneous Q24H  . feeding supplement (ENSURE ENLIVE)  237 mL Oral TID BM  . ferrous sulfate  325 mg Oral BID WC  . fluconazole  200 mg Oral Daily  . ipratropium-albuterol  3 mL Nebulization QID  . magnesium chloride  1 tablet Oral BID  . oxyCODONE  10 mg Oral TID AC & HS  . pneumococcal 23 valent vaccine  0.5 mL Intramuscular Tomorrow-1000  . potassium & sodium phosphates  1 packet Oral  TID WC & HS  . predniSONE  50 mg Oral Q breakfast   Continuous Infusions:    Active Problems:   Sepsis ([place])   UTI (lower urinary tract infection)   CAP (community acquired pneumonia)   Anemia   Hep C w/o coma, chronic ([place])   Protein-calorie malnutrition, severe    Time spent: 30 min    Fiore Detjen, [place] [facility] Pager [phone]. If 7PM-7AM, please contact night-coverage at [email or web address], password [facility] 02/21/2015, 3:24 PM  LOS: 3 days

## 2015-02-22 DIAGNOSIS — F411 Generalized anxiety disorder: Secondary | ICD-10-CM

## 2015-02-22 LAB — CBC
HCT: 32.6 % — ABNORMAL LOW (ref 36.0–46.0)
Hemoglobin: 10 g/dL — ABNORMAL LOW (ref 12.0–15.0)
MCH: 28 pg (ref 26.0–34.0)
MCHC: 30.7 g/dL (ref 30.0–36.0)
MCV: 91.3 fL (ref 78.0–100.0)
PLATELETS: 284 10*3/uL (ref 150–400)
RBC: 3.57 MIL/uL — AB (ref 3.87–5.11)
RDW: 17.8 % — AB (ref 11.5–15.5)
WBC: 5.1 10*3/uL (ref 4.0–10.5)

## 2015-02-22 LAB — RENAL FUNCTION PANEL
Albumin: 1.9 g/dL — ABNORMAL LOW (ref 3.5–5.0)
Anion gap: 11 (ref 5–15)
CHLORIDE: 102 mmol/L (ref 101–111)
CO2: 27 mmol/L (ref 22–32)
CREATININE: 0.46 mg/dL (ref 0.44–1.00)
Calcium: 8.2 mg/dL — ABNORMAL LOW (ref 8.9–10.3)
GFR calc Af Amer: >60 mL/min (ref >60)
GLUCOSE: 87 mg/dL (ref 65–99)
POTASSIUM: 3.8 mmol/L (ref 3.5–5.1)
Phosphorus: 2.8 mg/dL (ref 2.5–4.6)
Sodium: 140 mmol/L (ref 135–145)

## 2015-02-22 LAB — CULTURE, BLOOD (ROUTINE X 2)
Culture: NO GROWTH
Culture: NO GROWTH

## 2015-02-22 LAB — MAGNESIUM: MAGNESIUM: 2 mg/dL (ref 1.7–2.4)

## 2015-02-22 MED ORDER — BISOPROLOL FUMARATE 5 MG PO TABS
2.5000 mg | ORAL_TABLET | Freq: Every day | ORAL | Status: DC
  Administered 2015-02-22 – 2015-02-23 (×2): 2.5 mg via ORAL
  Filled 2015-02-22 (×3): qty 0.5

## 2015-02-22 MED ORDER — POTASSIUM CHLORIDE CRYS ER 20 MEQ PO TBCR
40.0000 meq | EXTENDED_RELEASE_TABLET | Freq: Once | ORAL | Status: AC
  Administered 2015-02-22: 40 meq via ORAL
  Filled 2015-02-22: qty 2

## 2015-02-22 MED ORDER — VALACYCLOVIR HCL 500 MG PO TABS
500.0000 mg | ORAL_TABLET | Freq: Two times a day (BID) | ORAL | Status: DC
  Administered 2015-02-22 – 2015-02-25 (×7): 500 mg via ORAL
  Filled 2015-02-22 (×7): qty 1

## 2015-02-22 NOTE — Progress Notes (Signed)
[facility] PROGRESS NOTE  [name] WUJ:[id number] DOB: December 19, 1955 DOA: 02/17/2015 PCP: Lorelee Market, MD  Brief Summary  [name] is a 60 y.o. Female with history of Anxiety; Chronic obstructive pulmonary disease ([place]); Depression; Nicotine dependence, hepatitis C; Hypertension; GERD; and Uterus cancer ([place]) who presented with a several day history of generalized weakness, fevers, nausea and vomiting.  She had been coughing for 3 weeks. For the past 1 week she had intermittent chest pains worse with coughing or shortness of breath.  She has chronic diarrhea and abdominal pain. Patient also had some dysuria.  Emergency department patient was found to be hypoxic down to 84 on RA and have lactic acid of 2.46 or worrisome for pneumonia of small right pleural effusion she was febrile up to 102.2 within tachycardia to 116 meeting sepsis criteria  Hospitalist was called for admission for sepsis likely secondary to community-acquired pneumonia.     Assessment/Plan  Sepsis and acute hypoxic respiratory failure secondary to right lower lobe pneumonia and acute COPD exacerbation, resolving.  She was initially started on broad-spectrum antibiotics with vancomycin, ceftriaxone, and azithromycin. Her Legionella and strep pneumo antigens were negative. Her azithromycin was discontinued. After cultures were no growth to date 48 hours, her vancomycin was also discontinued. Her temperature trended down. -  Blood cultures no growth to date -  Continue ceftriaxone -  Sputum culture pending  Acute COPD exacerbation with wheezing -  Continue prednisone burst 5 days -  Continue duo nebs -  Antibiotics as above  Mild somnolence, resolved.  ABG without hypercapnea and ammonia level wnl -  Hold sedating medications and but Xanax prn   UTI ruled out, UCx with less than 7000 colonies, negative  Nausea and vomiting, only one episode of emesis, resolved  Atypical chest pains are likely  related to right sided pneumonia, persistent.  Not improved with home oxycodone regimen. -  Continue additional prn oxycodone doses -  NSAID allergy   Mildly elevated troponins, likely demand ischemia from severe pneumonia.  Has ST segment depressions in the inferolateral leads which appear similar from one ECG to next -  ASA allergy  -  Started on beta blocker and statin -  ECHO:  Ejection fraction 65-70% with no regional wall motion abnormalities, peak PA pressure 41 mmHg -  Recommend outpatient stress test/cardiology consult   Nonsustained V. 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, stable, f/u with ID or GI as outpatient  Iron deficiency anemia, folate, TSH, and vitamin B12 levels within normal limits -  Transfused 1 unit PRBCs -  Continue iron supplementation  Severe protein calorie malnutrition -  Appreciate Nutrition consultation -  Regular diet with supplements -  Trial of remeron at night  Thrush and oral stomatitis, minimally improved despite 4 days of treatment -  Continue magic mouthwash -  Added fluconazole 12/31 -  Add valtrex for viral infection -  Encourage PO as able  Hypokalemia and hypomagnesemia, likely due to malnutrition -  Oral and IV potassium -  Additional IV magnesium and continue magnesium by mouth  Calcium corrects with albumin  Anxiety, insomnia, malnutrition -  continue low dose remeron  -  Continue xanax   AAA, managed by vascular  surgery as outpatient    Diet:  Regular Access:  PIV IVF:  Off Proph:  lovenox  Code Status: full Family Communication: patient alone Disposition Plan:  Anticipate will need SNF at discharge.  Given frailty, will order palliative care  consult    Consultants:  None  Procedures:  ECHO  Antibiotics:  Vancomycin   Ceftriaxone  Azithromycin  HPI/Subjective:  Persistent pain in right chest. Magic mouthwash not helping much anymore.  Mouth still very sore.  No obvious bleeding.  Still SOB.    Objective: Filed Vitals:   02/21/15 2113 02/22/15 0554 02/22/15 0924 02/22/15 1238  BP: 158/62 156/68    Pulse: 65 68    Temp: 98.3 F (36.8 C) 98.2 F (36.8 C)    TempSrc: Oral Oral    Resp: 18 16    Height:      Weight:      SpO2: 98% 96% 95% 98%    Intake/Output Summary (Last 24 hours) at 02/22/15 1309 Last data filed at 02/22/15 0635  Gross per 24 hour  Intake    290 ml  Output      0 ml  Net    290 ml   Filed Weights   02/18/15 0800  Weight: 52 kg (114 lb 10.2 oz)   Body mass index is 17.95 kg/(m^2).  Exam:   General:  Adult female,  awake alert  HEENT:  NCAT,  white plaques on tongue and soft palate resolvling, however, has ulcerated, friable-looking soft palate  Cardiovascular:  RRR, nl S1, S2 no mrg, 2+ pulses, warm extremities  Respiratory:  Rales and better aeration on the right chest mid-back but still very diminished at the right base  Abdomen:   NABS, soft, NT/ND  MSK:   Normal tone and bulk, no LEE  Neuro:  Diffusely weak, frail-appearing   Data Reviewed: Basic Metabolic Panel:  Recent Labs Lab 02/18/15 1045 02/18/15 1633 02/19/15 0710 02/20/15 0424 02/21/15 0527 02/22/15 0440  NA 140  --  140 140 140 140  K 2.4*  --  3.8 3.5 2.7* 3.8  CL 104  --  105 105 101 102  CO2 24  --  '24 25 28 27  '$ GLUCOSE 120*  --  113* 107* 94 87  BUN <5*  --  <5* 6 <5* <5*  CREATININE 0.51  --  0.34* 0.41* 0.45 0.46  CALCIUM 7.1*  --  7.6* 7.6* 8.0* 8.2*  MG  --  1.5* 1.6* 1.9 1.7 2.0  PHOS  --  2.6 2.3* 2.0* 2.6 2.8   Liver Function Tests:  Recent Labs Lab 02/17/15 2222 02/18/15 1045 02/19/15 0710 02/20/15 0424 02/20/15 1911 02/21/15 0527 02/22/15 0440  AST 13* 11*  --   --   59*  --   --   ALT 6* 7*  --   --  19  --   --   ALKPHOS 175* 143*  --   --  111  --   --   BILITOT 0.8 0.5  --   --  0.5  --   --   PROT 6.9 5.6*  --   --  5.4*  --   --   ALBUMIN 2.3* 1.7* 1.6* 1.5* 1.8* 1.7* 1.9*   No results for input(s): LIPASE, AMYLASE in the last 168 hours.  Recent Labs Lab 02/19/15 1115  AMMONIA 18   CBC:  Recent Labs Lab 02/17/15 2222 02/18/15 1045 02/19/15 1115 02/20/15 0424 02/20/15 1911 02/21/15 0527 02/22/15 0440  WBC  8.2 5.3 3.8* 4.2  --  4.4 5.1  NEUTROABS 6.6 4.2  --   --   --   --   --   HGB 9.6* 7.9* 7.9* 6.6* 9.1* 8.2* 10.0*  HCT 30.5* 24.7* 25.3* 21.9* 28.1* 25.6* 32.6*  MCV 93.6 94.3 95.5 94.8  --  90.5 91.3  PLT 229 149* 158 182  --  208 284    Recent Results (from the past 240 hour(s))  Culture, blood (routine x 2)     Status: None   Collection Time: 02/17/15  9:55 PM  Result Value Ref Range Status   Specimen Description BLOOD LEFT ARM  Final   Special Requests BOTTLES DRAWN AEROBIC AND ANAEROBIC 5ML  Final   Culture NO GROWTH 5 DAYS  Final   Report Status 02/22/2015 FINAL  Final  Culture, blood (routine x 2)     Status: None   Collection Time: 02/17/15  9:59 PM  Result Value Ref Range Status   Specimen Description BLOOD RIGHT ARM  Final   Special Requests AEROBIC BOTTLE ONLY 5ML  Final   Culture NO GROWTH 5 DAYS  Final   Report Status 02/22/2015 FINAL  Final  Urine culture     Status: None   Collection Time: 02/17/15 10:21 PM  Result Value Ref Range Status   Specimen Description URINE, CLEAN CATCH  Final   Special Requests NONE  Final   Culture 7,000 COLONIES/mL INSIGNIFICANT GROWTH  Final   Report Status 02/19/2015 FINAL  Final  Culture, blood (single)     Status: None (Preliminary result)   Collection Time: 02/17/15 11:30 PM  Result Value Ref Range Status   Specimen Description BLOOD RIGHT ANTECUBITAL  Final   Special Requests BOTTLES DRAWN AEROBIC AND ANAEROBIC 5CC  Final   Culture NO GROWTH 4 DAYS  Final    Report Status PENDING  Incomplete  MRSA PCR Screening     Status: None   Collection Time: 02/18/15  7:36 AM  Result Value Ref Range Status   MRSA by PCR NEGATIVE NEGATIVE Final    Comment:        The GeneXpert MRSA Assay (FDA approved for NASAL specimens only), is one component of a comprehensive MRSA colonization surveillance program. It is not intended to diagnose MRSA infection nor to guide or monitor treatment for MRSA infections.   Culture, sputum-assessment     Status: None   Collection Time: 02/19/15  8:51 PM  Result Value Ref Range Status   Specimen Description EXPECTORATED SPUTUM  Final   Special Requests Normal  Final   Sputum evaluation   Final    THIS SPECIMEN IS ACCEPTABLE. RESPIRATORY CULTURE REPORT TO FOLLOW.   Report Status 02/20/2015 FINAL  Final  Culture, respiratory (NON-Expectorated)     Status: None (Preliminary result)   Collection Time: 02/19/15  8:51 PM  Result Value Ref Range Status   Specimen Description EXPECTORATED SPUTUM  Final   Special Requests NONE  Final   Gram Stain   Final    FEW WBC PRESENT, PREDOMINANTLY PMN RARE SQUAMOUS EPITHELIAL CELLS PRESENT FEW GRAM NEGATIVE RODS FEW GRAM POSITIVE COCCI IN PAIRS FEW GRAM NEGATIVE COCCI    Culture   Final    Culture reincubated for better growth Performed at [facility]    Report Status PENDING  Incomplete     Studies: No results found.  Scheduled Meds: . sodium chloride   Intravenous Once  . antiseptic oral rinse  7 mL Mouth Rinse q12n4p  .  atorvastatin  40 mg Oral q1800  . cefTRIAXone (ROCEPHIN)  IV  1 g Intravenous Q24H  . chlorhexidine  15 mL Mouth Rinse BID  . enoxaparin (LOVENOX) injection  40 mg Subcutaneous Q24H  . feeding supplement (ENSURE ENLIVE)  237 mL Oral TID BM  . ferrous sulfate  325 mg Oral BID WC  . fluconazole  200 mg Oral Daily  . ipratropium-albuterol  3 mL Nebulization QID  . magnesium chloride  1 tablet Oral BID  . mirtazapine  7.5 mg Oral QHS  .  oxyCODONE  10 mg Oral TID AC & HS  . pneumococcal 23 valent vaccine  0.5 mL Intramuscular Tomorrow-1000  . potassium & sodium phosphates  1 packet Oral TID WC & HS  . predniSONE  50 mg Oral Q breakfast  . valACYclovir  500 mg Oral BID   Continuous Infusions:    Active Problems:   Anxiety state   Sepsis ([place])   UTI (lower urinary tract infection)   CAP (community acquired pneumonia)   Anemia   Hep C w/o coma, chronic (HCC)   Protein-calorie malnutrition, severe   Chronic pain   Acute respiratory failure with hypoxia ([place])   AAA (abdominal aortic aneurysm) without rupture ([place])    Time spent: 30 min    Laelani Vasko, [facility] Pager [phone]. If 7PM-7AM, please contact night-coverage at [email or web address], password [facility] 02/22/2015, 1:09 PM  LOS: 4 days

## 2015-02-22 NOTE — Care Management Note (Signed)
Case Management Note  Patient Details  Name: [name] MRN: [id number] Date of Birth: August 26, 1955  Subjective/Objective:    Date: 02/27/15 Spoke with patient at the bedside .  Introduced self as Tourist information centre manager and explained role in discharge planning and how to be reached.  Verified patient lives in town,  with spouse. Has a cane and a rolling walker .  Expressed potential need for a wheel chair.  Verified patient anticipates to go home with family,  at time of discharge and will have part-time supervision by family at this time to best of their knowledge.  Patient  denied needing help with their medication.  Patient is driven by spouse to MD appointments.  Verified patient has PCP Lorelee Market. Patient does not have [place] services with any agency at this time.  Await pt eval.   Plan: CM will continue to follow for discharge planning and [facility] resources.                 Action/Plan:   Expected Discharge Date:                  Expected Discharge Plan:  [place]  In-House Referral:     Discharge planning Services  CM Consult  Post Acute Care Choice:    Choice offered to:     DME Arranged:    DME Agency:     HH Arranged:    [place] Agency:     Status of Service:  In process, will continue to follow  Medicare Important Message Given:    Date Medicare IM Given:    Medicare IM give by:    Date Additional Medicare IM Given:    Additional Medicare Important Message give by:     If discussed at [place] of Stay Meetings, dates discussed:    Additional Comments:  Zenon Mayo, RN 02/22/2015, 1:36 PM

## 2015-02-22 NOTE — Progress Notes (Addendum)
PT Cancellation Note  Patient Details Name: [name] MRN: [id number] DOB: 1956-01-18   Cancelled Treatment:    Reason Eval/Treat Not Completed: Patient declined, no reason specified. Pt refusing PT at this time.  Will check back.  Addendum: Checked back in afternoon and pt wanted to eat, so PT set up lunch tray stating she would return later.  When PT returned husband present with pt sleeping.  He states she is feeling very poorly and was unable to eat due to soreness in throat.  Would like PT to let her rest and come back tomorrow.    Wasil Wolke LUBECK 02/22/2015, 9:46 AM

## 2015-02-23 DIAGNOSIS — I472 Ventricular tachycardia: Secondary | ICD-10-CM

## 2015-02-23 DIAGNOSIS — B182 Chronic viral hepatitis C: Secondary | ICD-10-CM

## 2015-02-23 DIAGNOSIS — I471 Supraventricular tachycardia: Secondary | ICD-10-CM

## 2015-02-23 DIAGNOSIS — I4729 Other ventricular tachycardia: Secondary | ICD-10-CM

## 2015-02-23 DIAGNOSIS — I714 Abdominal aortic aneurysm, without rupture: Secondary | ICD-10-CM

## 2015-02-23 DIAGNOSIS — R001 Bradycardia, unspecified: Secondary | ICD-10-CM

## 2015-02-23 DIAGNOSIS — I498 Other specified cardiac arrhythmias: Secondary | ICD-10-CM

## 2015-02-23 LAB — CULTURE, BLOOD (SINGLE): Culture: NO GROWTH

## 2015-02-23 LAB — CULTURE, RESPIRATORY

## 2015-02-23 LAB — CBC
HEMATOCRIT: 34.7 % — AB (ref 36.0–46.0)
HEMOGLOBIN: 10.8 g/dL — AB (ref 12.0–15.0)
MCH: 29 pg (ref 26.0–34.0)
MCHC: 31.1 g/dL (ref 30.0–36.0)
MCV: 93 fL (ref 78.0–100.0)
Platelets: 372 10*3/uL (ref 150–400)
RBC: 3.73 MIL/uL — ABNORMAL LOW (ref 3.87–5.11)
RDW: 17.3 % — ABNORMAL HIGH (ref 11.5–15.5)
WBC: 7.9 10*3/uL (ref 4.0–10.5)

## 2015-02-23 LAB — RENAL FUNCTION PANEL
ANION GAP: 12 (ref 5–15)
Albumin: 2.4 g/dL — ABNORMAL LOW (ref 3.5–5.0)
BUN: <5 mg/dL — ABNORMAL LOW (ref 6–20)
CALCIUM: 9 mg/dL (ref 8.9–10.3)
CO2: 27 mmol/L (ref 22–32)
Chloride: 102 mmol/L (ref 101–111)
Creatinine, Ser: 0.58 mg/dL (ref 0.44–1.00)
Glucose, Bld: 127 mg/dL — ABNORMAL HIGH (ref 65–99)
Phosphorus: 5 mg/dL — ABNORMAL HIGH (ref 2.5–4.6)
Potassium: 4.1 mmol/L (ref 3.5–5.1)
SODIUM: 141 mmol/L (ref 135–145)

## 2015-02-23 LAB — HEMOGLOBIN A1C
HEMOGLOBIN A1C: 5 % (ref 4.8–5.6)
Mean Plasma Glucose: 97 mg/dL

## 2015-02-23 LAB — CULTURE, RESPIRATORY W GRAM STAIN

## 2015-02-23 LAB — MAGNESIUM: Magnesium: 2.1 mg/dL (ref 1.7–2.4)

## 2015-02-23 MED ORDER — TIOTROPIUM BROMIDE MONOHYDRATE 18 MCG IN CAPS
18.0000 ug | ORAL_CAPSULE | Freq: Every day | RESPIRATORY_TRACT | Status: DC
  Administered 2015-02-24 – 2015-02-26 (×3): 18 ug via RESPIRATORY_TRACT
  Filled 2015-02-23 (×2): qty 5

## 2015-02-23 MED ORDER — BLISTEX MEDICATED EX OINT
TOPICAL_OINTMENT | CUTANEOUS | Status: DC | PRN
  Filled 2015-02-23: qty 10

## 2015-02-23 NOTE — Evaluation (Signed)
Physical Therapy Evaluation Patient Details Name: [name] MRN: [id number] DOB: 1955-11-28 Today's Date: 02/23/2015   History of Present Illness  [name] is a 60 y.o. Female with history of Anxiety; Chronic obstructive pulmonary disease ([place]); Depression; Nicotine dependence, hepatitis C; Hypertension; GERD; and Uterus cancer ([place]) who presented with a several day history of generalized weakness, fevers, nausea and vomiting.  Pt admitted with sepsis.  Clinical Impression  Pt admitted with above diagnosis. Pt currently with functional limitations due to the deficits listed below (see PT Problem List). Pt will benefit from skilled PT to increase their independence and safety with mobility to allow discharge to the venue listed below.  Pt limited by fatigue, but transferring at S leval and amb with min/guard.  Pt's daughter states they will have 24 hour A available.  At this time, based on mobility, recommend HHPT, but stressed to pt and daughter that pt has to be getting up with family on days HHPT does not come.  They verbalized understanding.  Also, discussed that MD wants her up for meals.    Follow Up Recommendations Home health PT;Supervision for mobility/OOB;Supervision/Assistance - 24 hour    Equipment Recommendations  None recommended by PT    Recommendations for Other Services       Precautions / Restrictions Precautions Precautions: Fall Restrictions Weight Bearing Restrictions: No      Mobility  Bed Mobility Overal bed mobility: Needs Assistance Bed Mobility: Supine to Sit     Supine to sit: Supervision;HOB elevated     General bed mobility comments: With rail and HOB elevated  Transfers Overall transfer level: Needs assistance   Transfers: Sit to/from Stand Sit to Stand: Supervision;Min guard         General transfer comment: S to stand from be, min/guard to/from low toilet  Ambulation/Gait Ambulation/Gait assistance: Min guard Ambulation  Distance (Feet): 25 Feet Assistive device: Rolling walker (2 wheeled) Gait Pattern/deviations: Decreased stride length;Trunk flexed     General Gait Details: Amb to bathroom, used toilet then ambulated to sink and around bed to recliner.  Decreased safety with positioning of RW at sink, but remained steady with use of sink counter.  Stairs            Wheelchair Mobility    Modified Rankin (Stroke Patients Only)       Balance Overall balance assessment: Needs assistance Sitting-balance support: Feet supported Sitting balance-Leahy Scale: Good     Standing balance support: During functional activity Standing balance-Leahy Scale: Fair                               Pertinent Vitals/Pain Pain Assessment: 0-10 Pain Score: 8  Pain Location: all over Pain Descriptors / Indicators: Aching    Home Living Family/patient expects to be discharged to:: Private residence Living Arrangements: Spouse/significant other Available Help at Discharge: Family;Available 24 hours/day Type of Home: House Home Access: Stairs to enter Entrance Stairs-Rails: Chemical engineer of Steps: 5 Home Layout: One level Home Equipment: Environmental consultant - 2 wheels;Cane - single point;Shower seat      Prior Function Level of Independence: Independent with assistive device(s)         Comments: Uses RW or cane depending on how she feels     Hand Dominance   Dominant Hand: Right    Extremity/Trunk Assessment   Upper Extremity Assessment: Overall WFL for tasks assessed  Lower Extremity Assessment: Overall WFL for tasks assessed;Generalized weakness      Cervical / Trunk Assessment: Normal  Communication   Communication: No difficulties  Cognition Arousal/Alertness: Awake/alert Behavior During Therapy: WFL for tasks assessed/performed Overall Cognitive Status: Within Functional Limits for tasks assessed                      General Comments  General comments (skin integrity, edema, etc.): Daughter present.  Discussed d/c options and importance of OOB, especially for meals.    Exercises        Assessment/Plan    PT Assessment Patient needs continued PT services  PT Diagnosis Difficulty walking;Generalized weakness   PT Problem List Decreased mobility;Decreased activity tolerance;Decreased strength;Decreased balance  PT Treatment Interventions DME instruction;Gait training;Stair training;Functional mobility training;Therapeutic activities;Therapeutic exercise;Balance training   PT Goals (Current goals can be found in the Care Plan section) Acute Rehab PT Goals Patient Stated Goal: feel better and go home PT Goal Formulation: With patient/family Time For Goal Achievement: 03/09/15 Potential to Achieve Goals: Good    Frequency Min 3X/week   Barriers to discharge        Co-evaluation               End of Session Equipment Utilized During Treatment: Gait belt Activity Tolerance: Patient tolerated treatment well;Patient limited by fatigue Patient left: in chair;with call bell/phone within reach;with family/visitor present Nurse Communication: Mobility status         Time: 7530-0511 PT Time Calculation (min) (ACUTE ONLY): 30 min   Charges:   PT Evaluation $Initial PT Evaluation Tier I: 1 Procedure (moderate tier) PT Treatments $Gait Training: 8-22 mins   PT G Codes:        Izsak Meir LUBECK 02/23/2015, 9:20 AM

## 2015-02-23 NOTE — Care Management Note (Signed)
Case Management Note  Patient Details  Name: [name] MRN: [id number] Date of Birth: May 23, 1955  Subjective/Objective:   Patient lives with her spouse Kasandra Knudsen, who will be with her 24 hrs and her daughter Janace Hoard will also be able to asist when needed. Patient has [employer] so unable to have HHPT  W/out qualifying diagnoses.  She chose [facility] for [facility] for PNA , referral made to [facility] , Butch Penny notified.  Soc will begin 24-48 hrs post dc.                   Action/Plan:   Expected Discharge Date:                  Expected Discharge Plan:  [place]  In-House Referral:     Discharge planning Services  CM Consult  Post Acute Care Choice:    Choice offered to:  Patient  DME Arranged:    DME Agency:     HH Arranged:  RN [place] Agency:  [place]  Status of Service:  Completed, signed off  Medicare Important Message Given:    Date Medicare IM Given:    Medicare IM give by:    Date Additional Medicare IM Given:    Additional Medicare Important Message give by:     If discussed at [place] of Stay Meetings, dates discussed:    Additional Comments:  Zenon Mayo, RN 02/23/2015, 2:12 PM

## 2015-02-23 NOTE — Progress Notes (Signed)
[facility] PROGRESS NOTE  [name] DTO:[id number] DOB: 1955/05/16 DOA: 02/17/2015 PCP: Lorelee Market, MD  Brief Summary  [name] is a 60 y.o. Female with history of Anxiety; Chronic obstructive pulmonary disease ([place]); Depression; Nicotine dependence, hepatitis C; Hypertension; GERD; and Uterus cancer ([place]) who presented with a several day history of generalized weakness, fevers, nausea and vomiting.  She had been coughing for 3 weeks. For the past 1 week she had intermittent chest pains worse with coughing or shortness of breath.  She has chronic diarrhea and abdominal pain. Patient also had some dysuria.  Emergency department patient was found to be hypoxic down to 84 on RA and have lactic acid of 2.46 or worrisome for pneumonia of small right pleural effusion she was febrile up to 102.2 within tachycardia to 116 meeting sepsis criteria  Hospitalist was called for admission for sepsis likely secondary to community-acquired pneumonia.    Assessment/Plan  Sepsis and acute hypoxic respiratory failure secondary to right lower lobe pneumonia and acute COPD exacerbation, resolving.  She was initially started on broad-spectrum antibiotics with vancomycin, ceftriaxone, and azithromycin. Her Legionella and strep pneumo antigens were negative. Her azithromycin was discontinued. After cultures were no growth to date 48 hours, her vancomycin was also discontinued. Her temperature trended down. -  Blood cultures no growth to date -  Continue ceftriaxone -  Sputum culture, yeast  Acute COPD exacerbation with wheezing -  D/c prednisone burst -  Continue duo nebs -  Antibiotics as above  Mild somnolence, resolved.  ABG without hypercapnea and ammonia level wnl -  Hold sedating medications and but Xanax prn   UTI ruled out, UCx with less than 7000 colonies, negative  Nausea and vomiting, only one episode of emesis, resolved  Atypical chest pains are likely related to right  sided pneumonia, persistent.  Not improved with home oxycodone regimen. -  Continue additional prn oxycodone doses -  NSAID allergy   Mildly elevated troponins, likely demand ischemia from severe pneumonia.  Has ST segment depressions in the inferolateral leads which appear similar from one ECG to next -  Tele:  Nonsustained V-tach, SVT and pauses 2.8 sec -  ASA allergy  -  Started on beta blocker and statin -  ECHO:  Ejection fraction 65-70% with no regional wall motion abnormalities, peak PA pressure 41 mmHg -  Recommend outpatient stress test/cardiology consult   Possible sick sinus syndrome and nonsustained V. Tach, up to 20 beats which was recurrent >> started bisoprolol but now having pauses of 2.8 seconds and bradycardia.   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, stable, f/u with ID or GI as outpatient  Iron deficiency anemia, folate, TSH, and vitamin B12 levels within normal limits -  Transfused 1 unit PRBCs -  Continue iron supplementation  Severe protein calorie malnutrition -  Appreciate Nutrition consultation -  Regular diet with supplements -  Trial of remeron at night  Thrush and oral stomatitis, minimally improved despite 4 days of treatment -  Continue magic mouthwash -  Added fluconazole 12/31 -  Added valtrex on 1/2 -  Encourage PO as able  Hypokalemia and hypomagnesemia, likely due to malnutrition -  Oral and IV potassium -  Additional  IV magnesium and continue magnesium by mouth  Calcium corrects with albumin  Anxiety, insomnia, malnutrition -  continue low dose remeron  -  Continue xanax   AAA, managed by vascular surgery as outpatient    Diet:  Regular Access:  PIV IVF:  Off Proph:  lovenox  Code  Status: full Family Communication: patient alone Disposition Plan:   Able to stay hydrated.  Cardiology consult for arrhythmias and elevated troponin.  Needs to work with PT.  I have requested records from her PCP's office.     Consultants:  Cardiology  Procedures:  ECHO  Antibiotics:  Vancomycin 12/29 > 1/1  Ceftriaxone 12/28 > 1/3  Azithromycin 12/28 > 12/30  HPI/Subjective:  Mouth still very sore.  Able to eat only a few bites yesterday at dinner.  Still SOB and very fatigued.  Denies chest pains other than her previously described pleuritic right chest pain.    Objective: Filed Vitals:   02/23/15 0010 02/23/15 0443 02/23/15 0854 02/23/15 1150  BP: 137/64 130/58    Pulse: 72 51    Temp: 97.6 F (36.4 C) 98.1 F (36.7 C)    TempSrc: Oral Oral    Resp: 16 16    Height:      Weight:      SpO2: 93% 92% 93% 94%    Intake/Output Summary (Last 24 hours) at 02/23/15 1325 Last data filed at 02/23/15 1209  Gross per 24 hour  Intake    883 ml  Output      3 ml  Net    880 ml   Filed Weights   02/18/15 0800  Weight: 52 kg (114 lb 10.2 oz)   Body mass index is 17.95 kg/(m^2).  Exam:   General:  Adult female,  awake alert  HEENT:  NCAT,  white plaques on tongue and soft palate resolving, however, has friable-looking soft palate which is very injected  Cardiovascular:  RRR, nl S1, S2 no mrg, 2+ pulses, warm extremities  Respiratory:  Rales and better aeration on the right chest mid-back but still very diminished at the right base  Abdomen:   NABS, soft, NT/ND  MSK:   Normal tone and bulk, no LEE  Neuro:  Diffusely weak, frail-appearing   Data Reviewed: Basic Metabolic Panel:  Recent Labs Lab 02/19/15 0710 02/20/15 0424 02/21/15 0527 02/22/15 0440 02/23/15 0945  NA 140 140 140 140 141  K 3.8 3.5 2.7* 3.8 4.1  CL 105 105 101 102 102  CO2 '24 25 28 27 27  '$ GLUCOSE 113* 107* 94 87 127*  BUN <5* 6 <5* <5* <5*  CREATININE 0.34* 0.41* 0.45 0.46 0.58   CALCIUM 7.6* 7.6* 8.0* 8.2* 9.0  MG 1.6* 1.9 1.7 2.0 2.1  PHOS 2.3* 2.0* 2.6 2.8 5.0*   Liver Function Tests:  Recent Labs Lab 02/17/15 2222 02/18/15 1045  02/20/15 0424 02/20/15 1911 02/21/15 0527 02/22/15 0440 02/23/15 0945  AST 13* 11*  --   --  59*  --   --   --   ALT 6* 7*  --   --  19  --   --   --   ALKPHOS 175* 143*  --   --  111  --   --   --   BILITOT 0.8 0.5  --   --  0.5  --   --   --   PROT 6.9 5.6*  --   --  5.4*  --   --   --  ALBUMIN 2.3* 1.7*  < > 1.5* 1.8* 1.7* 1.9* 2.4*  < > = values in this interval not displayed. No results for input(s): LIPASE, AMYLASE in the last 168 hours.  Recent Labs Lab 02/19/15 1115  AMMONIA 18   CBC:  Recent Labs Lab 02/17/15 2222 02/18/15 1045 02/19/15 1115 02/20/15 0424 02/20/15 1911 02/21/15 0527 02/22/15 0440 02/23/15 0945  WBC 8.2 5.3 3.8* 4.2  --  4.4 5.1 7.9  NEUTROABS 6.6 4.2  --   --   --   --   --   --   HGB 9.6* 7.9* 7.9* 6.6* 9.1* 8.2* 10.0* 10.8*  HCT 30.5* 24.7* 25.3* 21.9* 28.1* 25.6* 32.6* 34.7*  MCV 93.6 94.3 95.5 94.8  --  90.5 91.3 93.0  PLT 229 149* 158 182  --  208 284 372    Recent Results (from the past 240 hour(s))  Culture, blood (routine x 2)     Status: None   Collection Time: 02/17/15  9:55 PM  Result Value Ref Range Status   Specimen Description BLOOD LEFT ARM  Final   Special Requests BOTTLES DRAWN AEROBIC AND ANAEROBIC 5ML  Final   Culture NO GROWTH 5 DAYS  Final   Report Status 02/22/2015 FINAL  Final  Culture, blood (routine x 2)     Status: None   Collection Time: 02/17/15  9:59 PM  Result Value Ref Range Status   Specimen Description BLOOD RIGHT ARM  Final   Special Requests AEROBIC BOTTLE ONLY 5ML  Final   Culture NO GROWTH 5 DAYS  Final   Report Status 02/22/2015 FINAL  Final  Urine culture     Status: None   Collection Time: 02/17/15 10:21 PM  Result Value Ref Range Status   Specimen Description URINE, CLEAN CATCH  Final   Special Requests NONE  Final   Culture  7,000 COLONIES/mL INSIGNIFICANT GROWTH  Final   Report Status 02/19/2015 FINAL  Final  Culture, blood (single)     Status: None (Preliminary result)   Collection Time: 02/17/15 11:30 PM  Result Value Ref Range Status   Specimen Description BLOOD RIGHT ANTECUBITAL  Final   Special Requests BOTTLES DRAWN AEROBIC AND ANAEROBIC 5CC  Final   Culture NO GROWTH 4 DAYS  Final   Report Status PENDING  Incomplete  MRSA PCR Screening     Status: None   Collection Time: 02/18/15  7:36 AM  Result Value Ref Range Status   MRSA by PCR NEGATIVE NEGATIVE Final    Comment:        The GeneXpert MRSA Assay (FDA approved for NASAL specimens only), is one component of a comprehensive MRSA colonization surveillance program. It is not intended to diagnose MRSA infection nor to guide or monitor treatment for MRSA infections.   Culture, sputum-assessment     Status: None   Collection Time: 02/19/15  8:51 PM  Result Value Ref Range Status   Specimen Description EXPECTORATED SPUTUM  Final   Special Requests Normal  Final   Sputum evaluation   Final    THIS SPECIMEN IS ACCEPTABLE. RESPIRATORY CULTURE REPORT TO FOLLOW.   Report Status 02/20/2015 FINAL  Final  Culture, respiratory (NON-Expectorated)     Status: None   Collection Time: 02/19/15  8:51 PM  Result Value Ref Range Status   Specimen Description EXPECTORATED SPUTUM  Final   Special Requests NONE  Final   Gram Stain   Final    FEW WBC PRESENT, PREDOMINANTLY PMN RARE SQUAMOUS EPITHELIAL CELLS  PRESENT FEW GRAM NEGATIVE RODS FEW GRAM POSITIVE COCCI IN PAIRS FEW GRAM NEGATIVE COCCI    Culture FEW YEAST Performed at [facility]   Final   Report Status 02/23/2015 FINAL  Final     Studies: No results found.  Scheduled Meds: . sodium chloride   Intravenous Once  . antiseptic oral rinse  7 mL Mouth Rinse q12n4p  . atorvastatin  40 mg Oral q1800  . bisoprolol  2.5 mg Oral Daily  . cefTRIAXone (ROCEPHIN)  IV  1 g Intravenous Q24H   . chlorhexidine  15 mL Mouth Rinse BID  . enoxaparin (LOVENOX) injection  40 mg Subcutaneous Q24H  . feeding supplement (ENSURE ENLIVE)  237 mL Oral TID BM  . ferrous sulfate  325 mg Oral BID WC  . fluconazole  200 mg Oral Daily  . ipratropium-albuterol  3 mL Nebulization QID  . magnesium chloride  1 tablet Oral BID  . mirtazapine  7.5 mg Oral QHS  . oxyCODONE  10 mg Oral TID AC & HS  . pneumococcal 23 valent vaccine  0.5 mL Intramuscular Tomorrow-1000  . potassium & sodium phosphates  1 packet Oral TID WC & HS  . predniSONE  50 mg Oral Q breakfast  . valACYclovir  500 mg Oral BID   Continuous Infusions:    Active Problems:   Anxiety state   Sepsis ([place])   UTI (lower urinary tract infection)   CAP (community acquired pneumonia)   Anemia   Hep C w/o coma, chronic (HCC)   Protein-calorie malnutrition, severe   Chronic pain   Acute respiratory failure with hypoxia ([place])   AAA (abdominal aortic aneurysm) without rupture ([place])    Time spent: 30 min    [name], [facility] Pager [phone]. If 7PM-7AM, please contact night-coverage at [email or web address], password [facility] 02/23/2015, 1:25 PM  LOS: 5 days

## 2015-02-23 NOTE — Consult Note (Addendum)
CARDIOLOGY CONSULT NOTE   Patient ID: [name] MRN: [id number] DOB/AGE: August 08, 1955 60 y.o.  Admit date: 02/17/2015  Primary Physician   Rachael Market, MD Primary Cardiologist   New Reason for Consultation   Abnormal EKG Requesting Physician Dr. Sheran Bailey  HPI: [name] is a 60 y.o. female with a history of hypertension, hyperlipidemia, COPD, vitamin D deficiency, nicotine dependence, Exposure of implanted vaginal mesh and prosthetic material in vagina, uterus cancer, GERD, abdominal aortic aneurysm who came to [facility] 02/18/15 with 3 weeks hx of cough, CP with cough x 1week and SOB. In emergency department the patient was found to be hypoxic at o2 state of 84 and lactic acid of 2.46.   The patient was admitted for sepsis and acute hypoxic respiratory failure secondary to right lower lobe pneumonia and acute COPD exacerbation. The patient was treated with broad-spectrum antibiotic vancomycin, ceftriaxone and azithromycin. The patient was also transfused 1 unit PRBCs for hemoglobin of 6.6 02/20/15 and placed on fluconazole for thrush.   EKG on admission showed  sinus tachycardia at rate of 106 bpm, PACs, PVCs and nonspecific ST abnormality in inferior lateral lead. No prior EKG to compare. Echocardiogram 12/29 shows left ventricular function of 65-70%, no wall motion abnormality, PA peak pressure of 41 mmHg. Trop trend 0.08->0.22->0.18. She also had severe electrolyte abnormalities including hypokalemia (K of 2.7 02/21/15) and hypomagnesemia. The patient given supplement, now normal. The patient had a 21 beat of nonsustained VT and other few beats episode multiple times. This felt likely due to hypokalemia and started on low-dose BB 02/22/15. Now patient had a multiple episodes (more than 2.5 sec) of frequent pauses:  02/22/15 - 3.52 sec @ 2040; 3.8 sec '@2107'$  and 3.15 sec '@2155'$ .  03/25/15 - 2.78 sec @ 0809  Now off bisoprolol (last dose today 02/23/15). Reviewing telemetry alsoshowed  intermittent episode of sinus tachycardia at highest rate of 140s with frequent PACs and PVCs. Some strips also concerning for SVT.  Prior to  admission patient admits to having history of intermittent palpitation for many years. Unable to provide any further history. Also admits to having lower extremity edema intermittently. Denies history of syncope, orthopnea or PND.    Past Medical History  Diagnosis Date  . Anxiety   . Chronic obstructive pulmonary disease ([place])   . Depression   . Nicotine dependence   . Vitamin D deficiency   . Age-related osteoporosis without current pathological fracture   . Viral hepatitis C   . Exposure of implanted vaginal mesh and prosthetic material in vagina   . Hypertension   . GERD (gastroesophageal reflux disease)   . Uterus cancer ([place])   . Aortic aneurysm, abdominal (HCC) 9 cm     Past Surgical History  Procedure Laterality Date  . Cholecystectomy    . Tubal ligation    . Tonsillectomy    . Abdominal hysterectomy    . Bladder surgery    . Tumor removal      Nose  . Reconstruction of nose    . Neuroplasty / transposition median nerve at carpal tunnel      Allergies  Allergen Reactions  . Mucinex [Guaifenesin Er] Anaphylaxis  . Nsaids Anaphylaxis    I have reviewed the patient's current medications . sodium chloride   Intravenous Once  . antiseptic oral rinse  7 mL Mouth Rinse q12n4p  . atorvastatin  40 mg Oral q1800  . cefTRIAXone (ROCEPHIN)  IV  1 g Intravenous Q24H  . chlorhexidine  15 mL Mouth Rinse BID  . enoxaparin (LOVENOX) injection  40 mg Subcutaneous Q24H  . feeding supplement (ENSURE ENLIVE)  237 mL Oral TID BM  . ferrous sulfate  325 mg Oral BID WC  . fluconazole  200 mg Oral Daily  . magnesium chloride  1 tablet Oral BID  . mirtazapine  7.5 mg Oral QHS  . oxyCODONE  10 mg Oral TID AC & HS  . pneumococcal 23 valent vaccine  0.5 mL Intramuscular Tomorrow-1000  . tiotropium  18 mcg Inhalation Daily  . valACYclovir   500 mg Oral BID     ALPRAZolam, lactulose, levalbuterol, magic mouthwash w/lidocaine, oxyCODONE  Prior to Admission medications   Medication Sig Start Date End Date Taking? Authorizing Provider  ALPRAZolam Duanne Moron) 0.5 MG tablet Take 0.5 mg by mouth 2 (two) times daily.   Yes Historical Provider, MD  aspirin EC 81 MG tablet Take 81 mg by mouth every 6 (six) hours as needed for mild pain.   Yes Historical Provider, MD  Oxycodone HCl 10 MG TABS Take 10 mg by mouth 4 (four) times daily.    Yes Historical Provider, MD     Social History   Social History  . Marital Status: Divorced    Spouse Name: N/A  . Number of Children: N/A  . Years of Education: N/A   Occupational History  . Not on file.   Social History Main Topics  . Smoking status: Current Every Day Smoker    Types: Cigarettes  . Smokeless tobacco: Never Used  . Alcohol Use: No  . Drug Use: No  . Sexual Activity: Not on file   Other Topics Concern  . Not on file   Social History Narrative    No family status information on file.   Family History  Problem Relation Age of Onset  . Diabetes Mother   . Liver cancer Brother   . Breast cancer Mother       ROS:  Full 14 point review of systems complete and found to be negative unless listed above.  Physical Exam: Blood pressure 130/58, pulse 51, temperature 98.1 F (36.7 C), temperature source Oral, resp. rate 16, height '5\' 7"'$  (1.702 m), weight 114 lb 10.2 oz (52 kg), SpO2 94 %.  General: Frail ill appearing  female in no acute distress Head: Eyes PERRLA, No xanthomas. Normocephalic and atraumatic, oropharynx without edema or exudate.  Lungs: Resp regular and unlabored. Course breath sound with diffuse wheezing.  Heart: RRR no s3, s4, or murmurs..   Neck: Bilateral carotid bruits. No lymphadenopathy. No JVD. Abdomen: Bowel sounds present, abdomen soft and non-tender without masses or hernias noted. Msk:  No spine or cva tenderness. No weakness, no joint deformities  or effusions. Extremities: No clubbing, cyanosis or edema. DP/PT/Radials 2+ and equal bilaterally. Neuro: Alert and oriented X 3. No focal deficits noted. Psych:  Good affect, responds appropriately Skin: No rashes or lesions noted.  Labs:   Lab Results  Component Value Date   WBC 7.9 02/23/2015   HGB 10.8* 02/23/2015   HCT 34.7* 02/23/2015   MCV 93.0 02/23/2015   PLT 372 02/23/2015    Recent Labs  02/20/15 1911  INR 1.14    Recent Labs Lab 02/20/15 1911  02/23/15 0945  NA  --   < > 141  K  --   < > 4.1  CL  --   < > 102  CO2  --   < > 27  BUN  --   < > <  5*  CREATININE  --   < > 0.58  CALCIUM  --   < > 9.0  PROT 5.4*  --   --   BILITOT 0.5  --   --   ALKPHOS 111  --   --   ALT 19  --   --   AST 59*  --   --   GLUCOSE  --   < > 127*  ALBUMIN 1.8*  < > 2.4*  < > = values in this interval not displayed. MAGNESIUM  Date Value Ref Range Status  02/23/2015 2.1 1.7 - 2.4 mg/dL Final   No results for input(s): CKTOTAL, CKMB, TROPONINI in the last 72 hours. No results for input(s): TROPIPOC in the last 72 hours. No results found for: PROBNP Lab Results  Component Value Date   CHOL 72 02/19/2015   HDL 12* 02/19/2015   LDLCALC 28 02/19/2015   TRIG 161* 02/19/2015   TSH  Date/Time Value Ref Range Status  02/18/2015 04:33 PM 0.350 0.350 - 4.500 uIU/mL Final   VITAMIN B-12  Date/Time Value Ref Range Status  02/18/2015 04:33 PM 566 180 - 914 pg/mL Final    Comment:    (NOTE) This assay is not validated for testing neonatal or myeloproliferative syndrome specimens for Vitamin B12 levels.    FOLATE  Date/Time Value Ref Range Status  02/18/2015 04:33 PM 13.6 >5.9 ng/mL Final   FERRITIN  Date/Time Value Ref Range Status  02/18/2015 04:33 PM 101 11 - 307 ng/mL Final   TIBC  Date/Time Value Ref Range Status  02/18/2015 04:33 PM 168* 250 - 450 ug/dL Final   IRON  Date/Time Value Ref Range Status  02/18/2015 04:33 PM 9* 28 - 170 ug/dL Final    Echo:  02/18/2015 LV EF: 65% -  70%  ------------------------------------------------------------------- Indications:   Chest pain 786.51.  ------------------------------------------------------------------- History:  PMH: Positive Troponin. Hepatitis C. Chronic obstructive pulmonary disease.  ------------------------------------------------------------------- Study Conclusions  - Left ventricle: The cavity size was normal. Systolic function was vigorous. The estimated ejection fraction was in the range of 65% to 70%. Wall motion was normal; there were no regional wall motion abnormalities. - Pulmonary arteries: Systolic pressure was mildly to moderately increased. PA peak pressure: 41 mm Hg (S).  ECG:   Vent. rate 106 BPM PR interval * ms QRS duration 96 ms QT/QTc 332/441 ms P-R-T axes -1 75 -1  Radiology:  No results found.  ASSESSMENT AND PLAN:      1. Sinus arrhythmia - Patient admitted with sepsis and acute hypoxic respiratory failure  2nd to pneumonia and COPD exacerbation. - Patient also has a severe electrolyte abnormality prompting possibly her to a nonsustained VT. Started on bisoprolol-->had significant sinus pauses with longest of 3.8 sec. Now off Bisoprolol (last dose 02/23/15).  Reviewing telemetry showed intermittent episode of sinus tachycardia at highest rate of 140s with frequent PACs and PVCs. Some strips also concerning for SVT.  - Suspicious of sick sinus syndrome in setting of multiple acute abnormality. The patient also has history of intermittent palpitations  for many years for which she never evaluated. Will continue to monitor as she off BB. If continues to have a pause will get EP consult tomorrow. Can consider 30 days event monitor @ discharge.  - Echocardiogram 12/29 shows left ventricular function of 65-70%, no wall motion abnormality, PA peak pressure of 41 mmHg. - Will plan for outpatient Myoview once resolved acute illness. MD to see.    2. Bilateral carotid  bruit - Will need outpatient carotid doppler  3. AAA - Followed by vascular as outpatient.   4. Elevated troponin - Demand ischemia in a setting of sepsis, severe anemia and pneumonia.  5. Anemia - Given 1 PRBCs.  - Consider stool guiac.   Otherwise per primary:   Anxiety state   Sepsis ([place])   UTI (lower urinary tract infection)   CAP (community acquired pneumonia)   Anemia   Hep C w/o coma, chronic (HCC)   Protein-calorie malnutrition, severe   Chronic pain   Acute respiratory failure with hypoxia (HCC)   AAA (abdominal aortic aneurysm) without rupture (HCC)   Nonsustained ventricular tachycardia (HCC)   Bradycardia with 41 - 50 beats per minute   Signed: Bhagat,Bhavinkumar, PA 02/23/2015, 2:38 PM  Co-Sign MD   I have examined the patient and reviewed assessment and plan and discussed with patient.  Agree with above as stated.  Patient with several medical issues including cirrhosis.  She had severe electrolyte abnormalities and then reportedly nonsustained VTach (see below).  She has had severe anemia as well requiring transfusion.  No active bleeding site found but hemoccult pending.  She may have some atherosclerotic disease but at this time, she is not a good candidate for cath/PCI or any prolonged anticoagulation/antiplatlet therapy.  She has had CP with coughing only.  No exertional chest discomfort.    WOuld recommend medical therapy at this time.  Correct electrolytes.  Correct hemoglobin.  WOuld not pursue stress test at this time.  Watch on tele. Titrate beta blocker as tolerated.  If sx change, could consider ischemia testing.  Carotid Doppler would be reasonable given bruits.  SHe needs to stop smoking.   Watch on tele for lengthening of pauses.  I personally reviewed telemetry and saw only evidence of SVT. No VT strips are noted in EPIC either. I did not see ventricular tachycardia episodes on the [facility] tele station.  Given this, would  certainly not pursue ischemia w/u at this time.   Bashir Marchetti S.

## 2015-02-24 DIAGNOSIS — E43 Unspecified severe protein-calorie malnutrition: Secondary | ICD-10-CM

## 2015-02-24 DIAGNOSIS — J9601 Acute respiratory failure with hypoxia: Secondary | ICD-10-CM

## 2015-02-24 LAB — RENAL FUNCTION PANEL
ANION GAP: 11 (ref 5–15)
Albumin: 2.1 g/dL — ABNORMAL LOW (ref 3.5–5.0)
BUN: 7 mg/dL (ref 6–20)
CHLORIDE: 103 mmol/L (ref 101–111)
CO2: 26 mmol/L (ref 22–32)
CREATININE: 0.42 mg/dL — AB (ref 0.44–1.00)
Calcium: 8.6 mg/dL — ABNORMAL LOW (ref 8.9–10.3)
Glucose, Bld: 90 mg/dL (ref 65–99)
POTASSIUM: 3.8 mmol/L (ref 3.5–5.1)
Phosphorus: 4.3 mg/dL (ref 2.5–4.6)
Sodium: 140 mmol/L (ref 135–145)

## 2015-02-24 LAB — CBC
HEMATOCRIT: 29.4 % — AB (ref 36.0–46.0)
HEMOGLOBIN: 9 g/dL — AB (ref 12.0–15.0)
MCH: 28.4 pg (ref 26.0–34.0)
MCHC: 30.6 g/dL (ref 30.0–36.0)
MCV: 92.7 fL (ref 78.0–100.0)
PLATELETS: 372 10*3/uL (ref 150–400)
RBC: 3.17 MIL/uL — AB (ref 3.87–5.11)
RDW: 17.2 % — ABNORMAL HIGH (ref 11.5–15.5)
WBC: 7.7 10*3/uL (ref 4.0–10.5)

## 2015-02-24 LAB — MAGNESIUM: Magnesium: 2 mg/dL (ref 1.7–2.4)

## 2015-02-24 MED ORDER — FLUCONAZOLE IN SODIUM CHLORIDE 200-0.9 MG/100ML-% IV SOLN
200.0000 mg | INTRAVENOUS | Status: DC
  Administered 2015-02-25 – 2015-02-26 (×2): 200 mg via INTRAVENOUS
  Filled 2015-02-24 (×2): qty 100

## 2015-02-24 NOTE — Consult Note (Addendum)
UNASSIGNED PATIENT Reason for Consult: Painful swallowing. Referring Physician: THP  [name] is an 60 y.o. female.  HPI: 60 year old white female, with multiple medical problems listed below, admitted with pneumonia and sepsis after she presented with fatigue, weakness and malaise, cough for 3 weeks with nausea nd vomiting-improved on BSA. She developed oral thrush and stomatitis, treated with Diflucan but over the last 2 days has developed severe odynophagia/dysphagia. She denies having any abdominal pain. She has had some diarrhea off and on. She claims she is not able to swallow a sip of water in the last 2 hours. She has never had a problem like this before.    Past Medical History  Diagnosis Date  . Anxiety   . Chronic obstructive pulmonary disease ([place])   . Depression   . Nicotine dependence   . Vitamin D deficiency   . Age-related osteoporosis without current pathological fracture   . Viral hepatitis C   . Exposure of implanted vaginal mesh and prosthetic material in vagina   . Hypertension   . GERD (gastroesophageal reflux disease)   . Uterus cancer ([place])   . Aortic aneurysm, abdominal (HCC) 9 cm   Past Surgical History  Procedure Laterality Date  . Cholecystectomy    . Tubal ligation    . Tonsillectomy    . Abdominal hysterectomy    . Bladder surgery    . Tumor removal      Nose  . Reconstruction of nose    . Neuroplasty / transposition median nerve at carpal tunnel     Family History  Problem Relation Age of Onset  . Diabetes Mother   . Liver cancer Brother   . Breast cancer Mother    Social History:  reports that she has been smoking cigarettes.  She has never used smokeless tobacco. She reports that she does not drink alcohol or use illicit drugs.  Allergies:  Allergies  Allergen Reactions  . Mucinex [Guaifenesin Er] Anaphylaxis  . Nsaids Anaphylaxis   Medications: I have reviewed the patient's current medications.  Results for orders placed or  performed during the hospital encounter of 02/17/15 (from the past 48 hour(s))  CBC     Status: Abnormal   Collection Time: 02/23/15  9:45 AM  Result Value Ref Range   WBC 7.9 4.0 - 10.5 K/uL   RBC 3.73 (L) 3.87 - 5.11 MIL/uL   Hemoglobin 10.8 (L) 12.0 - 15.0 g/dL   HCT 34.7 (L) 36.0 - 46.0 %   MCV 93.0 78.0 - 100.0 fL   MCH 29.0 26.0 - 34.0 pg   MCHC 31.1 30.0 - 36.0 g/dL   RDW 17.3 (H) 11.5 - 15.5 %   Platelets 372 150 - 400 K/uL  Magnesium     Status: None   Collection Time: 02/23/15  9:45 AM  Result Value Ref Range   Magnesium 2.1 1.7 - 2.4 mg/dL  Renal function panel     Status: Abnormal   Collection Time: 02/23/15  9:45 AM  Result Value Ref Range   Sodium 141 135 - 145 mmol/L   Potassium 4.1 3.5 - 5.1 mmol/L   Chloride 102 101 - 111 mmol/L   CO2 27 22 - 32 mmol/L   Glucose, Bld 127 (H) 65 - 99 mg/dL   BUN <5 (L) 6 - 20 mg/dL   Creatinine, Ser 0.58 0.44 - 1.00 mg/dL   Calcium 9.0 8.9 - 10.3 mg/dL   Phosphorus 5.0 (H) 2.5 - 4.6 mg/dL  Albumin 2.4 (L) 3.5 - 5.0 g/dL   GFR calc non Af Amer >60 >60 mL/min   GFR calc Af Amer >60 >60 mL/min    Comment: (NOTE) The eGFR has been calculated using the CKD EPI equation. This calculation has not been validated in all clinical situations. eGFR's persistently <60 mL/min signify possible Chronic Kidney Disease.    Anion gap 12 5 - 15  Renal function panel     Status: Abnormal   Collection Time: 02/24/15  5:20 AM  Result Value Ref Range   Sodium 140 135 - 145 mmol/L   Potassium 3.8 3.5 - 5.1 mmol/L   Chloride 103 101 - 111 mmol/L   CO2 26 22 - 32 mmol/L   Glucose, Bld 90 65 - 99 mg/dL   BUN 7 6 - 20 mg/dL   Creatinine, Ser 0.42 (L) 0.44 - 1.00 mg/dL   Calcium 8.6 (L) 8.9 - 10.3 mg/dL   Phosphorus 4.3 2.5 - 4.6 mg/dL   Albumin 2.1 (L) 3.5 - 5.0 g/dL   GFR calc non Af Amer >60 >60 mL/min   GFR calc Af Amer >60 >60 mL/min    Comment: (NOTE) The eGFR has been calculated using the CKD EPI equation. This calculation has not  been validated in all clinical situations. eGFR's persistently <60 mL/min signify possible Chronic Kidney Disease.    Anion gap 11 5 - 15  CBC     Status: Abnormal   Collection Time: 02/24/15  5:20 AM  Result Value Ref Range   WBC 7.7 4.0 - 10.5 K/uL   RBC 3.17 (L) 3.87 - 5.11 MIL/uL   Hemoglobin 9.0 (L) 12.0 - 15.0 g/dL   HCT 29.4 (L) 36.0 - 46.0 %   MCV 92.7 78.0 - 100.0 fL   MCH 28.4 26.0 - 34.0 pg   MCHC 30.6 30.0 - 36.0 g/dL   RDW 17.2 (H) 11.5 - 15.5 %   Platelets 372 150 - 400 K/uL  Magnesium     Status: None   Collection Time: 02/24/15  5:20 AM  Result Value Ref Range   Magnesium 2.0 1.7 - 2.4 mg/dL   Review of Systems  Constitutional: Positive for weight loss and malaise/fatigue. Negative for fever, chills and diaphoresis.  HENT: Negative.   Eyes: Negative.   Respiratory: Positive for shortness of breath. Negative for cough, hemoptysis, sputum production and wheezing.   Cardiovascular: Negative.   Gastrointestinal: Positive for diarrhea. Negative for heartburn, nausea, vomiting, constipation, blood in stool and melena.  Genitourinary: Negative.   Musculoskeletal: Positive for back pain and joint pain.  Skin: Negative.   Neurological: Positive for weakness.  Psychiatric/Behavioral: Positive for depression. Negative for suicidal ideas, hallucinations and substance abuse. The patient is nervous/anxious and has insomnia.    Blood pressure 160/59, pulse 67, temperature 97.7 F (36.5 C), temperature source Oral, resp. rate 18, height '5\' 7"'$  (1.702 m), weight 52 kg (114 lb 10.2 oz), SpO2 90 %. Physical Exam  Constitutional: Vital signs are normal. She appears cachectic. She is cooperative. She has a sickly appearance. No distress.  HENT:  Mouth/Throat: Mucous membranes are dry.  Dry crusted exudates at the margins of the lips  Eyes: Conjunctivae and lids are normal. Pupils are equal, round, and reactive to light.  Neck: Carotid bruit is present.  Cardiovascular: Normal  rate and regular rhythm.   GI: Soft. Normal appearance and bowel sounds are normal. There is no tenderness. There is no rebound and no CVA tenderness.   Assessment/Plan: 1)  Severe dysphagia/odynophagia; will proceed with an EGD tomorrow. She has been treated for oral candidiasis recently. We need to rule out esophageal candidiasis.  2) CAP/COPD/Tobacco abuse/S/P ARF. 3) Chronic Hepatitis C. 4) Severe PCM. 5) Depression/Anxiety/Insomina.  [name] 02/24/2015, 12:21 PM

## 2015-02-24 NOTE — Progress Notes (Signed)
[facility] PROGRESS NOTE  [name] JOI:[id number] DOB: [birth year] DOA: 02/17/2015 PCP: Rachael Market, MD  Brief Summary  [name] is a 60 y.o. Female with history of Anxiety; Chronic obstructive pulmonary disease ([place]); Depression; Nicotine dependence, hepatitis C; Hypertension; GERD; and Uterus cancer ([name]) who presented with a several day history of generalized weakness, fevers, nausea and vomiting.  She had been coughing for 3 weeks. For the past 1 week she had intermittent chest pains worse with coughing or shortness of breath.  She has chronic diarrhea and abdominal pain. Patient also had some dysuria.  Emergency department patient was found to be hypoxic down to 84 on RA and have lactic acid of 2.46 or worrisome for pneumonia of small right pleural effusion she was febrile up to 102.2 within tachycardia to 116 meeting sepsis criteria  Hospitalist was called for admission for sepsis likely secondary to community-acquired pneumonia.    Assessment/Plan  Sepsis and acute hypoxic respiratory failure secondary to right lower lobe pneumonia and acute COPD exacerbation, resolving.  She was initially started on broad-spectrum antibiotics with vancomycin, ceftriaxone, and azithromycin. Her Legionella and strep pneumo antigens were negative. Her azithromycin was discontinued. After cultures were no growth to date 48 hours, her vancomycin was also discontinued. Her temperature trended down. -  Blood cultures no growth to date -  Sputum culture, yeast - complete the course of antibiotics.   Acute COPD exacerbation with wheezing -  D/c prednisone burst -  Continue duo nebs -  Antibiotics as above  Mild somnolence, resolved.  ABG without hypercapnea and ammonia level wnl -  Hold sedating medications and but Xanax prn   UTI ruled out, UCx with less than 7000 colonies, negative  Nausea and vomiting, only one episode of emesis, resolved  Atypical chest pains are likely  related to right sided pneumonia, persistent.  Not improved with home oxycodone regimen. -  Continue additional prn oxycodone doses -  NSAID allergy   Mildly elevated troponins, likely demand ischemia from severe pneumonia.  Has ST segment depressions in the inferolateral leads which appear similar from one ECG to next -  Tele:  Nonsustained V-tach, SVT and pauses 2.8 sec -  ASA allergy  -  Started on beta blocker and statin -  ECHO:  Ejection fraction 65-70% with no regional wall motion abnormalities, peak PA pressure 41 mmHg -  Recommend outpatient stress test/cardiology consult   Possible sick sinus syndrome and nonsustained V. Tach, up to 20 beats which was recurrent >> started bisoprolol but now having pauses of 2.8 seconds and bradycardia.   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   Hypokalemia and hypomagnesemia, likely due to malnutrition -  repleted as needed.   Calcium corrects with albumin  Anxiety,  insomnia, malnutrition -  continue low dose remeron  -  Continue xanax   AAA, managed by vascular surgery as outpatient    Diet:  Regular Access:  PIV IVF:  Off Proph:  lovenox  Code Status: full Family Communication: patient alone Disposition Plan:    Pending EGD in am.   Consultants:  Cardiology  Gastroenterology.  Procedures:  ECHO  EGD on 1/5  Antibiotics:  Vancomycin 12/29 > 1/1  Ceftriaxone 12/28 > 1/3  Azithromycin 12/28 > 12/30  HPI/Subjective:   worsening pain on swallowing.   Objective: Filed Vitals:   02/23/15 2133 02/24/15 0558 02/24/15 0836 02/24/15 1442  BP: 123/46 160/59  133/57  Pulse: 108 67  61  Temp: 98.2 F (36.8 C) 97.7 F (36.5 C)    TempSrc: Oral Oral    Resp: '18 18  18  '$ Height:      Weight:      SpO2: 92% 96% 90% 93%    Intake/Output Summary (Last 24 hours) at 02/24/15 1849 Last data filed at 02/24/15 1834  Gross per 24 hour  Intake      0 ml  Output      8 ml  Net     -8 ml   Filed Weights   02/18/15 0800  Weight: 52 kg (114 lb 10.2 oz)   Body mass index is 17.95 kg/(m^2).  Exam:   General:  Adult female,  awake alert  Cardiovascular:  RRR, nl S1, S2 no mrg, 2+ pulses, warm extremities  Respiratory:  Rales and better aeration on the right chest mid-back but still very diminished at the right base  Abdomen:   NABS, soft, NT/ND  Neuro:  Diffusely weak, frail-appearing   Data Reviewed: Basic Metabolic Panel:  Recent Labs Lab 02/20/15 0424 02/21/15 0527 02/22/15 0440 02/23/15 0945 02/24/15 0520  NA 140 140 140 141 140  K 3.5 2.7* 3.8 4.1 3.8  CL 105 101 102 102 103  CO2 '25 28 27 27 26  '$ GLUCOSE 107* 94 87 127* 90  BUN 6 <5* <5* <5* 7  CREATININE 0.41* 0.45 0.46 0.58 0.42*  CALCIUM 7.6* 8.0* 8.2* 9.0 8.6*  MG 1.9 1.7 2.0 2.1 2.0  PHOS 2.0* 2.6 2.8 5.0* 4.3   Liver Function Tests:  Recent Labs Lab 02/17/15 2222 02/18/15 1045  02/20/15 1911 02/21/15 0527 02/22/15 0440 02/23/15 0945 02/24/15 0520  AST 13* 11*  --  59*  --   --   --   --   ALT 6* 7*  --  19  --   --   --   --   ALKPHOS 175* 143*  --  111  --   --   --   --   BILITOT 0.8 0.5  --  0.5  --   --   --   --   PROT 6.9 5.6*  --  5.4*  --   --   --   --   ALBUMIN 2.3* 1.7*  < > 1.8* 1.7*  1.9* 2.4* 2.1*  < > = values in this interval not displayed. No results for input(s): LIPASE, AMYLASE in the last 168 hours.  Recent Labs Lab 02/19/15 1115  AMMONIA 18   CBC:  Recent Labs Lab 02/17/15 2222 02/18/15 1045  02/20/15 0424 02/20/15 1911 02/21/15 0527 02/22/15 0440 02/23/15 0945 02/24/15 0520  WBC 8.2 5.3  < > 4.2  --  4.4 5.1 7.9 7.7  NEUTROABS 6.6 4.2  --   --   --   --   --   --   --  HGB 9.6* 7.9*  < > 6.6* 9.1* 8.2* 10.0* 10.8* 9.0*  HCT 30.5* 24.7*  < > 21.9* 28.1* 25.6* 32.6* 34.7* 29.4*  MCV 93.6 94.3  < > 94.8  --  90.5 91.3 93.0 92.7  PLT 229 149*  < > 182  --  208 284 372 372  < > = values in this interval not displayed.  Recent Results (from the past 240 hour(s))  Culture, blood (routine x 2)     Status: None   Collection Time: 02/17/15  9:55 PM  Result Value Ref Range Status   Specimen Description BLOOD LEFT ARM  Final   Special Requests BOTTLES DRAWN AEROBIC AND ANAEROBIC 5ML  Final   Culture NO GROWTH 5 DAYS  Final   Report Status 02/22/2015 FINAL  Final  Culture, blood (routine x 2)     Status: None   Collection Time: 02/17/15  9:59 PM  Result Value Ref Range Status   Specimen Description BLOOD RIGHT ARM  Final   Special Requests AEROBIC BOTTLE ONLY 5ML  Final   Culture NO GROWTH 5 DAYS  Final   Report Status 02/22/2015 FINAL  Final  Urine culture     Status: None   Collection Time: 02/17/15 10:21 PM  Result Value Ref Range Status   Specimen Description URINE, CLEAN CATCH  Final   Special Requests NONE  Final   Culture 7,000 COLONIES/mL INSIGNIFICANT GROWTH  Final   Report Status 02/19/2015 FINAL  Final  Culture, blood (single)     Status: None   Collection Time: 02/17/15 11:30 PM  Result Value Ref Range Status   Specimen Description BLOOD RIGHT ANTECUBITAL  Final   Special Requests BOTTLES DRAWN AEROBIC AND ANAEROBIC 5CC  Final   Culture NO GROWTH 5 DAYS  Final   Report Status 02/23/2015 FINAL  Final  MRSA PCR Screening      Status: None   Collection Time: 02/18/15  7:36 AM  Result Value Ref Range Status   MRSA by PCR NEGATIVE NEGATIVE Final    Comment:        The GeneXpert MRSA Assay (FDA approved for NASAL specimens only), is one component of a comprehensive MRSA colonization surveillance program. It is not intended to diagnose MRSA infection nor to guide or monitor treatment for MRSA infections.   Culture, sputum-assessment     Status: None   Collection Time: 02/19/15  8:51 PM  Result Value Ref Range Status   Specimen Description EXPECTORATED SPUTUM  Final   Special Requests Normal  Final   Sputum evaluation   Final    THIS SPECIMEN IS ACCEPTABLE. RESPIRATORY CULTURE REPORT TO FOLLOW.   Report Status 02/20/2015 FINAL  Final  Culture, respiratory (NON-Expectorated)     Status: None   Collection Time: 02/19/15  8:51 PM  Result Value Ref Range Status   Specimen Description EXPECTORATED SPUTUM  Final   Special Requests NONE  Final   Gram Stain   Final    FEW WBC PRESENT, PREDOMINANTLY PMN RARE SQUAMOUS EPITHELIAL CELLS PRESENT FEW GRAM NEGATIVE RODS FEW GRAM POSITIVE COCCI IN PAIRS FEW GRAM NEGATIVE COCCI    Culture FEW YEAST Performed at [facility]   Final   Report Status 02/23/2015 FINAL  Final     Studies: No results found.  Scheduled Meds: . sodium chloride   Intravenous Once  . antiseptic oral rinse  7 mL Mouth Rinse q12n4p  . atorvastatin  40 mg Oral q1800  . cefTRIAXone (ROCEPHIN)  IV  1 g Intravenous Q24H  . chlorhexidine  15 mL Mouth Rinse BID  . enoxaparin (LOVENOX) injection  40 mg Subcutaneous Q24H  . ferrous sulfate  325 mg Oral BID WC  . [START ON 02/25/2015] fluconazole (DIFLUCAN) IV  200 mg Intravenous Q24H  . magnesium chloride  1 tablet Oral BID  . mirtazapine  7.5 mg Oral QHS  . oxyCODONE  10 mg Oral TID AC & HS  . pneumococcal 23 valent vaccine  0.5 mL Intramuscular Tomorrow-1000  . tiotropium  18 mcg Inhalation Daily  . valACYclovir  500 mg Oral BID    Continuous Infusions:    Active Problems:   Anxiety state   Sepsis ([place])   UTI (lower urinary tract infection)   CAP (community acquired pneumonia)   Anemia   Hep C w/o coma, chronic (HCC)   Protein-calorie malnutrition, severe   Chronic pain   Acute respiratory failure with hypoxia ([place])   AAA (abdominal aortic aneurysm) without rupture (HCC)   Nonsustained ventricular tachycardia (HCC)   Bradycardia with 41 - 50 beats per minute    Time spent: 30 min    [place] [facility] Pager [phone]. If 7PM-7AM, please contact night-coverage at [email or web address], password [facility] 02/24/2015, 6:49 PM  LOS: 6 days

## 2015-02-24 NOTE — Progress Notes (Signed)
Physical Therapy Treatment Patient Details Name: [name] MRN: [id number] DOB: 07-Nov-1955 Today's Date: 02/24/2015    History of Present Illness [name] is a 60 y.o. Female with history of Anxiety; Chronic obstructive pulmonary disease ([place]); Depression; Nicotine dependence, hepatitis C; Hypertension; GERD; and Uterus cancer ([place]) who presented with a several day history of generalized weakness, fevers, nausea and vomiting.  Pt admitted with sepsis.    PT Comments    Patient progressing well towards PT goals. Very slow gait requiring use of RW for stability/energy conservation. Encouraged OOB as much as tolerated. Improved ambulation distance today with encouragement. May need RW for home pending progress. Will follow acutely per current POC.   Follow Up Recommendations  Home health PT;Supervision for mobility/OOB     Equipment Recommendations  Rolling walker with 5" wheels    Recommendations for Other Services       Precautions / Restrictions Precautions Precautions: Fall Restrictions Weight Bearing Restrictions: No    Mobility  Bed Mobility Overal bed mobility: Needs Assistance Bed Mobility: Supine to Sit     Supine to sit: Modified independent (Device/Increase time);HOB elevated     General bed mobility comments: With rail and HOB elevated  Transfers Overall transfer level: Needs assistance Equipment used: Rolling walker (2 wheeled) Transfers: Sit to/from Stand Sit to Stand: Supervision         General transfer comment: Supervision for safety. Stood from [employer].  Ambulation/Gait Ambulation/Gait assistance: Min guard Ambulation Distance (Feet): 80 Feet (x2 bouts) Assistive device: Rolling walker (2 wheeled) Gait Pattern/deviations: Decreased stride length;Trunk flexed Gait velocity: .49 ft/sec Gait velocity interpretation: <1.8 ft/sec, indicative of risk for recurrent falls General Gait Details: very slow, steady gait with use of RW. 1 seated  rest break. Fatigues quickly.    Stairs            Wheelchair Mobility    Modified Rankin (Stroke Patients Only)       Balance Overall balance assessment: Needs assistance Sitting-balance support: Feet supported;No upper extremity supported Sitting balance-Leahy Scale: Good Sitting balance - Comments: Assist donning PJS sitting EOB.    Standing balance support: During functional activity Standing balance-Leahy Scale: Fair Standing balance comment: Mildly unsteady in standing but able to donn pants without UE support.                    Cognition Arousal/Alertness: Awake/alert Behavior During Therapy: WFL for tasks assessed/performed Overall Cognitive Status: Within Functional Limits for tasks assessed                      Exercises      General Comments General comments (skin integrity, edema, etc.): Husband present.       Pertinent Vitals/Pain Pain Assessment: Faces Faces Pain Scale: Hurts even more Pain Location: mouth, lips Pain Descriptors / Indicators: Sore Pain Intervention(s): Monitored during session;Repositioned    Home Living                      Prior Function            PT Goals (current goals can now be found in the care plan section) Progress towards PT goals: Progressing toward goals    Frequency  Min 3X/week    PT Plan Current plan remains appropriate    Co-evaluation             End of Session Equipment Utilized During Treatment: Gait belt Activity Tolerance: Patient tolerated  treatment well;Patient limited by fatigue Patient left: in chair;with call bell/phone within reach;with family/visitor present     Time: 4010-2725 PT Time Calculation (min) (ACUTE ONLY): 32 min  Charges:  $Gait Training: 23-37 mins                    G Codes:      Brittani Purdum A Jasmene Goswami 02/24/2015, 12:05 PM Wray Kearns, [place], DPT [phone]

## 2015-02-24 NOTE — Progress Notes (Signed)
Nutrition Follow-up  DOCUMENTATION CODES:   Severe malnutrition in context of chronic illness, Underweight  INTERVENTION:   -D/c Ensure Enlive due to poor accpetance -Magic Cup TID with meals  NUTRITION DIAGNOSIS:   Increased nutrient needs related to chronic illness as evidenced by estimated needs.  Ongoing  GOAL:   Patient will meet greater than or equal to 90% of their needs  Unmet  MONITOR:   PO intake, Supplement acceptance, Labs, Weight trends, I & O's  REASON FOR ASSESSMENT:   Consult Assessment of nutrition requirement/status  ASSESSMENT:   60 yo Female with PMH of COPD, depression, Vit D deficiency, osteoporosis; presented with generalized weakness some nausea and vomiting she was confused at the time of arrival to emergency department. Reports have been coughing for the past 3 weeks. For the past 1 week she's been having intermittent chest pains worse with coughing or shortness of breath. Patient also endorses some diarrhea and abdominal pain which may have been chronic. Patient also endorsed some dysuria.  Pt transferred from SDU to medical floor on 02/21/15.  Pt working with PT at time of visit; ambulating halls.   Pt has been advanced to a soft diet. Intake continues to be poor; noted 10-40% meal completion per doc flowsheet records. Pt is also refusing Ensure Enlive supplements. Trial of remeron was initiated on 02/21/15 due to poor appetite.   Labs reviewed.  Diet Order:  DIET SOFT Room service appropriate?: Yes; Fluid consistency:: Thin  Skin:  Reviewed, no issues  Last BM:  02/23/15  Height:   Ht Readings from Last 1 Encounters:  02/18/15 '5\' 7"'$  (1.702 m)    Weight:   Wt Readings from Last 1 Encounters:  02/18/15 114 lb 10.2 oz (52 kg)    Ideal Body Weight:  61.3 kg  BMI:  Body mass index is 17.95 kg/(m^2).  Estimated Nutritional Needs:   Kcal:  1300-1500  Protein:  60-70 gm  Fluid:  >/= 1.5 L  EDUCATION NEEDS:   No education needs  identified at this time  Vennesa Bastedo A. Jimmye Norman, RD, LDN, CDE Pager: [phone] After hours Pager: [phone]

## 2015-02-25 ENCOUNTER — Encounter (HOSPITAL_COMMUNITY): Payer: Self-pay

## 2015-02-25 ENCOUNTER — Encounter (HOSPITAL_COMMUNITY)
Admission: EM | Disposition: A | Discharge status: Home Health | Payer: Self-pay | Source: Home / Self Care | Attending: Internal Medicine

## 2015-02-25 DIAGNOSIS — G8929 Other chronic pain: Secondary | ICD-10-CM

## 2015-02-25 DIAGNOSIS — J189 Pneumonia, unspecified organism: Secondary | ICD-10-CM

## 2015-02-25 HISTORY — PX: ESOPHAGOGASTRODUODENOSCOPY: SHX5428

## 2015-02-25 LAB — RENAL FUNCTION PANEL
ANION GAP: 9 (ref 5–15)
Albumin: 2.1 g/dL — ABNORMAL LOW (ref 3.5–5.0)
BUN: <5 mg/dL — ABNORMAL LOW (ref 6–20)
CHLORIDE: 102 mmol/L (ref 101–111)
CO2: 29 mmol/L (ref 22–32)
CREATININE: 0.46 mg/dL (ref 0.44–1.00)
Calcium: 8.3 mg/dL — ABNORMAL LOW (ref 8.9–10.3)
Glucose, Bld: 87 mg/dL (ref 65–99)
Phosphorus: 4.5 mg/dL (ref 2.5–4.6)
Potassium: 3.2 mmol/L — ABNORMAL LOW (ref 3.5–5.1)
Sodium: 140 mmol/L (ref 135–145)

## 2015-02-25 LAB — CBC
HCT: 30.1 % — ABNORMAL LOW (ref 36.0–46.0)
HEMOGLOBIN: 9.3 g/dL — AB (ref 12.0–15.0)
MCH: 29.1 pg (ref 26.0–34.0)
MCHC: 30.9 g/dL (ref 30.0–36.0)
MCV: 94.1 fL (ref 78.0–100.0)
PLATELETS: 418 10*3/uL — AB (ref 150–400)
RBC: 3.2 MIL/uL — AB (ref 3.87–5.11)
RDW: 16.9 % — ABNORMAL HIGH (ref 11.5–15.5)
WBC: 5.5 10*3/uL (ref 4.0–10.5)

## 2015-02-25 LAB — MAGNESIUM: MAGNESIUM: 1.9 mg/dL (ref 1.7–2.4)

## 2015-02-25 SURGERY — EGD (ESOPHAGOGASTRODUODENOSCOPY)
Anesthesia: Moderate Sedation

## 2015-02-25 MED ORDER — MIDAZOLAM HCL 5 MG/ML IJ SOLN
INTRAMUSCULAR | Status: AC
  Filled 2015-02-25: qty 2

## 2015-02-25 MED ORDER — FENTANYL CITRATE (PF) 100 MCG/2ML IJ SOLN
INTRAMUSCULAR | Status: DC | PRN
  Administered 2015-02-25 (×2): 25 ug via INTRAVENOUS

## 2015-02-25 MED ORDER — FENTANYL CITRATE (PF) 100 MCG/2ML IJ SOLN
INTRAMUSCULAR | Status: AC
  Filled 2015-02-25: qty 2

## 2015-02-25 MED ORDER — MIDAZOLAM HCL 10 MG/2ML IJ SOLN
INTRAMUSCULAR | Status: DC | PRN
  Administered 2015-02-25: 2 mg via INTRAVENOUS
  Administered 2015-02-25: 1 mg via INTRAVENOUS
  Administered 2015-02-25: 2 mg via INTRAVENOUS

## 2015-02-25 MED ORDER — DIPHENHYDRAMINE HCL 50 MG/ML IJ SOLN
INTRAMUSCULAR | Status: AC
  Filled 2015-02-25: qty 1

## 2015-02-25 MED ORDER — ACYCLOVIR SODIUM 50 MG/ML IV SOLN
5.0000 mg/kg | Freq: Three times a day (TID) | INTRAVENOUS | Status: DC
  Administered 2015-02-25 – 2015-02-26 (×3): 260 mg via INTRAVENOUS
  Filled 2015-02-25 (×4): qty 5.2

## 2015-02-25 NOTE — Interval H&P Note (Signed)
History and Physical Interval Note:  02/25/2015 3:32 PM  [name]  has presented today for surgery, with the diagnosis of Odynophagia/dysphagia  The various methods of treatment have been discussed with the patient and family. After consideration of risks, benefits and other options for treatment, the patient has consented to  Procedure(s): ESOPHAGOGASTRODUODENOSCOPY (EGD) (N/A) as a surgical intervention .  The patient's history has been reviewed, patient examined, no change in status, stable for surgery.  I have reviewed the patient's chart and labs.  Questions were answered to the patient's satisfaction.     [name]

## 2015-02-25 NOTE — Progress Notes (Signed)
Pt. Was found eating ice cream while on a clear liquid diet. Educated pt. And family about what a clear liquid diet consist of and why she is unable to have anything else outside of that diet. Reported off to night RN ([place]) who will follow up with care. No further needs noted at this time.

## 2015-02-25 NOTE — H&P (View-Only) (Signed)
UNASSIGNED PATIENT Reason for Consult: Painful swallowing. Referring Physician: THP  [name] is an 60 y.o. female.  HPI: 60 year old white female, with multiple medical problems listed below, admitted with pneumonia and sepsis after she presented with fatigue, weakness and malaise, cough for 3 weeks with nausea nd vomiting-improved on BSA. She developed oral thrush and stomatitis, treated with Diflucan but over the last 2 days has developed severe odynophagia/dysphagia. She denies having any abdominal pain. She has had some diarrhea off and on. She claims she is not able to swallow a sip of water in the last 2 hours. She has never had a problem like this before.    Past Medical History  Diagnosis Date  . Anxiety   . Chronic obstructive pulmonary disease ([place])   . Depression   . Nicotine dependence   . Vitamin D deficiency   . Age-related osteoporosis without current pathological fracture   . Viral hepatitis C   . Exposure of implanted vaginal mesh and prosthetic material in vagina   . Hypertension   . GERD (gastroesophageal reflux disease)   . Uterus cancer ([place])   . Aortic aneurysm, abdominal (HCC) 9 cm   Past Surgical History  Procedure Laterality Date  . Cholecystectomy    . Tubal ligation    . Tonsillectomy    . Abdominal hysterectomy    . Bladder surgery    . Tumor removal      Nose  . Reconstruction of nose    . Neuroplasty / transposition median nerve at carpal tunnel     Family History  Problem Relation Age of Onset  . Diabetes Mother   . Liver cancer Brother   . Breast cancer Mother    Social History:  reports that she has been smoking cigarettes.  She has never used smokeless tobacco. She reports that she does not drink alcohol or use illicit drugs.  Allergies:  Allergies  Allergen Reactions  . Mucinex [Guaifenesin Er] Anaphylaxis  . Nsaids Anaphylaxis   Medications: I have reviewed the patient's current medications.  Results for orders placed or  performed during the hospital encounter of 02/17/15 (from the past 48 hour(s))  CBC     Status: Abnormal   Collection Time: 02/23/15  9:45 AM  Result Value Ref Range   WBC 7.9 4.0 - 10.5 K/uL   RBC 3.73 (L) 3.87 - 5.11 MIL/uL   Hemoglobin 10.8 (L) 12.0 - 15.0 g/dL   HCT 34.7 (L) 36.0 - 46.0 %   MCV 93.0 78.0 - 100.0 fL   MCH 29.0 26.0 - 34.0 pg   MCHC 31.1 30.0 - 36.0 g/dL   RDW 17.3 (H) 11.5 - 15.5 %   Platelets 372 150 - 400 K/uL  Magnesium     Status: None   Collection Time: 02/23/15  9:45 AM  Result Value Ref Range   Magnesium 2.1 1.7 - 2.4 mg/dL  Renal function panel     Status: Abnormal   Collection Time: 02/23/15  9:45 AM  Result Value Ref Range   Sodium 141 135 - 145 mmol/L   Potassium 4.1 3.5 - 5.1 mmol/L   Chloride 102 101 - 111 mmol/L   CO2 27 22 - 32 mmol/L   Glucose, Bld 127 (H) 65 - 99 mg/dL   BUN <5 (L) 6 - 20 mg/dL   Creatinine, Ser 0.58 0.44 - 1.00 mg/dL   Calcium 9.0 8.9 - 10.3 mg/dL   Phosphorus 5.0 (H) 2.5 - 4.6 mg/dL  Albumin 2.4 (L) 3.5 - 5.0 g/dL   GFR calc non Af Amer >60 >60 mL/min   GFR calc Af Amer >60 >60 mL/min    Comment: (NOTE) The eGFR has been calculated using the CKD EPI equation. This calculation has not been validated in all clinical situations. eGFR's persistently <60 mL/min signify possible Chronic Kidney Disease.    Anion gap 12 5 - 15  Renal function panel     Status: Abnormal   Collection Time: 02/24/15  5:20 AM  Result Value Ref Range   Sodium 140 135 - 145 mmol/L   Potassium 3.8 3.5 - 5.1 mmol/L   Chloride 103 101 - 111 mmol/L   CO2 26 22 - 32 mmol/L   Glucose, Bld 90 65 - 99 mg/dL   BUN 7 6 - 20 mg/dL   Creatinine, Ser 0.42 (L) 0.44 - 1.00 mg/dL   Calcium 8.6 (L) 8.9 - 10.3 mg/dL   Phosphorus 4.3 2.5 - 4.6 mg/dL   Albumin 2.1 (L) 3.5 - 5.0 g/dL   GFR calc non Af Amer >60 >60 mL/min   GFR calc Af Amer >60 >60 mL/min    Comment: (NOTE) The eGFR has been calculated using the CKD EPI equation. This calculation has not  been validated in all clinical situations. eGFR's persistently <60 mL/min signify possible Chronic Kidney Disease.    Anion gap 11 5 - 15  CBC     Status: Abnormal   Collection Time: 02/24/15  5:20 AM  Result Value Ref Range   WBC 7.7 4.0 - 10.5 K/uL   RBC 3.17 (L) 3.87 - 5.11 MIL/uL   Hemoglobin 9.0 (L) 12.0 - 15.0 g/dL   HCT 29.4 (L) 36.0 - 46.0 %   MCV 92.7 78.0 - 100.0 fL   MCH 28.4 26.0 - 34.0 pg   MCHC 30.6 30.0 - 36.0 g/dL   RDW 17.2 (H) 11.5 - 15.5 %   Platelets 372 150 - 400 K/uL  Magnesium     Status: None   Collection Time: 02/24/15  5:20 AM  Result Value Ref Range   Magnesium 2.0 1.7 - 2.4 mg/dL   Review of Systems  Constitutional: Positive for weight loss and malaise/fatigue. Negative for fever, chills and diaphoresis.  HENT: Negative.   Eyes: Negative.   Respiratory: Positive for shortness of breath. Negative for cough, hemoptysis, sputum production and wheezing.   Cardiovascular: Negative.   Gastrointestinal: Positive for diarrhea. Negative for heartburn, nausea, vomiting, constipation, blood in stool and melena.  Genitourinary: Negative.   Musculoskeletal: Positive for back pain and joint pain.  Skin: Negative.   Neurological: Positive for weakness.  Psychiatric/Behavioral: Positive for depression. Negative for suicidal ideas, hallucinations and substance abuse. The patient is nervous/anxious and has insomnia.    Blood pressure 160/59, pulse 67, temperature 97.7 F (36.5 C), temperature source Oral, resp. rate 18, height '5\' 7"'$  (1.702 m), weight 52 kg (114 lb 10.2 oz), SpO2 90 %. Physical Exam  Constitutional: Vital signs are normal. She appears cachectic. She is cooperative. She has a sickly appearance. No distress.  HENT:  Mouth/Throat: Mucous membranes are dry.  Dry crusted exudates at the margins of the lips  Eyes: Conjunctivae and lids are normal. Pupils are equal, round, and reactive to light.  Neck: Carotid bruit is present.  Cardiovascular: Normal  rate and regular rhythm.   GI: Soft. Normal appearance and bowel sounds are normal. There is no tenderness. There is no rebound and no CVA tenderness.   Assessment/Plan: 1)  Severe dysphagia/odynophagia; will proceed with an EGD tomorrow. She has been treated for oral candidiasis recently. We need to rule out esophageal candidiasis.  2) CAP/COPD/Tobacco abuse/S/P ARF. 3) Chronic Hepatitis C. 4) Severe PCM. 5) Depression/Anxiety/Insomina.  Jsean Taussig 02/24/2015, 12:21 PM

## 2015-02-25 NOTE — Op Note (Signed)
[facility] [place], [id number]   ENDOSCOPY PROCEDURE REPORT  PATIENT: [name], [name]  MR#: [id number] BIRTHDATE: 12-19-1955 , 59  yrs. old GENDER: female ENDOSCOPIST:Jodeci Rini Benson [place], MD REFERRED BY: PROCEDURE DATE:  05-Mar-2015 PROCEDURE:   EGD w/ biopsy ASA CLASS:    Class III INDICATIONS: Odynophagia MEDICATION: Versed 4 mg IV and Fentanyl 50 mcg IV TOPICAL ANESTHETIC:   none  DESCRIPTION OF PROCEDURE:   After the risks and benefits of the procedure were explained, informed consent was obtained.  The Pentax Gastroscope [id number]  endoscope was introduced through the mouth  and advanced to the second portion of the duodenum .  The instrument was slowly withdrawn as the mucosa was fully examined. Estimated blood loss is zero unless otherwise noted in this procedure report.   FINDINGS: Entry into the oral cavity revealed friable mucosa that easily sloughed off (Image 001).  In the esophagus multiple shallow erosions were identified.  In some areas it was more confluent such as the mid esophagus and the Z-line.  Biopsies were obtained from the edges of the erosions as there was a high suspicion for HSV esophagitis.  In the gastric antrum several nonbleeding healing ulcers were found and cold biopsies were obtained.    Retroflexed views revealed no abnormalities.    The scope was then withdrawn from the patient and the procedure completed.  COMPLICATIONS: There were no immediate complications.  ENDOSCOPIC IMPRESSION: 1) ? HSV esophagitis. 2) Healing antral ulcers.  RECOMMENDATIONS: 1) Emperic treatment with acyclovir. 2) Follow up biopsies. _______________________________ eSignedCarol Ada, MD 2015-03-05 4:14 PM    cc: CPT CODES: ICD CODES:

## 2015-02-25 NOTE — Progress Notes (Signed)
SUBJECTIVE:  Chest pain with swallowing and coughing. No palpitations  OBJECTIVE:   Vitals:   Filed Vitals:   02/24/15 2200 02/25/15 0632 02/25/15 0633 02/25/15 0757  BP: 160/85 136/36 112/47   Pulse: 59 47 54 64  Temp: 98 F (36.7 C) 98.2 F (36.8 C)    TempSrc: Oral Oral    Resp: 18   18  Height:      Weight:      SpO2: 97% 90%  90%   I&O's:   Intake/Output Summary (Last 24 hours) at 02/25/15 1322 Last data filed at 02/25/15 9371  Gross per 24 hour  Intake 525.83 ml  Output      8 ml  Net 517.83 ml   TELEMETRY: Reviewed telemetry pt in NSR:     PHYSICAL EXAM General: Well developed, well nourished, in no acute distress, frail, chronically ill Head:   Normal cephalic and atramatic  Lungs:   Clear bilaterally to auscultation. Heart:   HRRR S1 S2  No JVD.   Abdomen: abdomen soft and non-tender Msk:  Back normal,  Normal strength and tone for age. Extremities:   No edema.   Neuro: Alert and oriented. Psych:  Normal affect, responds appropriately Skin: No rash   LABS: Basic Metabolic Panel:  Recent Labs  02/24/15 0520 02/25/15 0655  NA 140 140  K 3.8 3.2*  CL 103 102  CO2 26 29  GLUCOSE 90 87  BUN 7 <5*  CREATININE 0.42* 0.46  CALCIUM 8.6* 8.3*  MG 2.0 1.9  PHOS 4.3 4.5   Liver Function Tests:  Recent Labs  02/24/15 0520 02/25/15 0655  ALBUMIN 2.1* 2.1*   No results for input(s): LIPASE, AMYLASE in the last 72 hours. CBC:  Recent Labs  02/24/15 0520 02/25/15 0655  WBC 7.7 5.5  HGB 9.0* 9.3*  HCT 29.4* 30.1*  MCV 92.7 94.1  PLT 372 418*   Cardiac Enzymes: No results for input(s): CKTOTAL, CKMB, CKMBINDEX, TROPONINI in the last 72 hours. BNP: Invalid input(s): POCBNP D-Dimer: No results for input(s): DDIMER in the last 72 hours. Hemoglobin A1C: No results for input(s): HGBA1C in the last 72 hours. Fasting Lipid Panel: No results for input(s): CHOL, HDL, LDLCALC, TRIG, CHOLHDL, LDLDIRECT in the last 72 hours. Thyroid Function  Tests: No results for input(s): TSH, T4TOTAL, T3FREE, THYROIDAB in the last 72 hours.  Invalid input(s): FREET3 Anemia Panel: No results for input(s): VITAMINB12, FOLATE, FERRITIN, TIBC, IRON, RETICCTPCT in the last 72 hours. Coag Panel:   Lab Results  Component Value Date   INR 1.14 02/20/2015   INR 1.32 02/18/2015   INR 1.27 02/18/2015    RADIOLOGY: Dg Chest 2 View  02/17/2015  CLINICAL DATA:  60 year old female with confusion and disorientation and fever. EXAM: CHEST  2 VIEW COMPARISON:  Chest radiograph dated 03/01/2012 FINDINGS: Two views of the chest demonstrate multiple patchy areas of increased opacity in the mid to lower right lower lung field compatible with pneumonia. There is a small right pleural effusion. The left lung is clear. There is no pneumothorax. The cardiac silhouette is within normal limits. The osseous structures appear unremarkable. IMPRESSION: Pneumonia. Small right pleural effusion. Clinical correlation and follow-up recommended. Electronically Signed   By: Anner Crete M.D.   On: 02/17/2015 22:47    Normal LV function by echo  ASSESSMENT: [name]:    1) I again reviewed tele.  No evidence of ventricular tachycardia.  I have only seen SVT in the past.  THis has resolved.  Continue low dose beta blolcker.  She does have pauses , mostly during sleep.  THis may limit titrating beta blocker.  She has no anginal pain.  Only pain with swallowing and coughing.  WOuld not plan further cardiac w/u given her other multiple medical problems.    Will sign off. Please call with questions.  Jettie Booze, MD  02/25/2015  1:22 PM

## 2015-02-25 NOTE — Progress Notes (Signed)
[facility] PROGRESS NOTE  [name] TMH:[id number] DOB: Mar 05, 1955 DOA: 02/17/2015 PCP: Lorelee Market, MD  Brief Summary  [name] is a 60 y.o. Female with history of Anxiety; Chronic obstructive pulmonary disease ([place]); Depression; Nicotine dependence, hepatitis C; Hypertension; GERD; and Uterus cancer ([place]) who presented with a several day history of generalized weakness, fevers, nausea and vomiting.  She had been coughing for 3 weeks. For the past 1 week she had intermittent chest pains worse with coughing or shortness of breath.  She has chronic diarrhea and abdominal pain. Patient also had some dysuria.  Emergency department patient was found to be hypoxic down to 84 on RA and have lactic acid of 2.46 or worrisome for pneumonia of small right pleural effusion she was febrile up to 102.2 within tachycardia to 116 meeting sepsis criteria  Hospitalist was called for admission for sepsis likely secondary to community-acquired pneumonia.    Assessment/Plan  Sepsis and acute hypoxic respiratory failure secondary to right lower lobe pneumonia and acute COPD exacerbation, resolving.  She was initially started on broad-spectrum antibiotics with vancomycin, ceftriaxone, and azithromycin. Her Legionella and strep pneumo antigens were negative. Her azithromycin was discontinued. After cultures were no growth to date 48 hours, her vancomycin was also discontinued. Her temperature trended down. -  Blood cultures no growth to date -  Sputum culture, yeast - completed the course of antibiotics.   Acute COPD exacerbation with wheezing -  D/c prednisone burst -  Continue duo nebs - resolved.  Mild somnolence, resolved.  ABG without hypercapnea and ammonia level wnl -  Hold sedating medications and but Xanax prn   UTI ruled out, UCx with less than 7000 colonies, negative  Nausea and vomiting, only one episode of emesis, resolved  Atypical chest pains are likely related to  right sided pneumonia, persistent.  Not improved with home oxycodone regimen. -  Continue additional prn oxycodone doses -  NSAID allergy   Mildly elevated troponins, likely demand ischemia from severe pneumonia.  Has ST segment depressions in the inferolateral leads which appear similar from one ECG to next -  Tele:  Nonsustained V-tach, SVT and pauses 2.8 sec -  ASA allergy  -  Started on beta blocker and statin -  ECHO:  Ejection fraction 65-70% with no regional wall motion abnormalities, peak PA pressure 41 mmHg -  Recommend outpatient stress test/cardiology consult   Possible sick sinus syndrome and nonsustained V. Tach, up to 20 beats which was recurrent >> started bisoprolol but now having pauses of 2.8 seconds and bradycardia.   D/c bisoprolol.  No further work up needed at this time as per cardiology.   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  - empirically on acyclovir.  EGD shows esophagitis, possibly viral , antral ulcers are healing. Biopsies taken and resume acyclovir.   Hypokalemia and hypomagnesemia, likely due to malnutrition -  repleted as needed.   Calcium corrects with albumin  Anxiety, insomnia, malnutrition -  continue low dose remeron  -  Continue xanax   AAA, managed by vascular surgery as outpatient    Diet:  Regular Access:  PIV IVF:  Off Proph:  lovenox  Code Status: full Family Communication:family at beside.  Disposition Plan:   Possibly home in 1 to 2  days.   Consultants:  Cardiology  Gastroenterology.  Procedures:  ECHO  EGD on 1/5  Antibiotics:  Vancomycin 12/29 > 1/1  Ceftriaxone 12/28 > 1/3  Azithromycin 12/28 > 12/30  HPI/Subjective:  Pain is better today.   Objective: Filed Vitals:   02/25/15 1600 02/25/15 1605 02/25/15 1610 02/25/15 1649  BP: 114/44 112/46 114/50 116/57  Pulse: 79 76  66  Temp:    98.3 F (36.8 C)  TempSrc:    Oral  Resp: '22 24  21  '$ Height:      Weight:      SpO2: 94% 92%  93%    Intake/Output Summary (Last 24 hours) at 02/25/15 1851 Last data filed at 02/25/15 1352  Gross per 24 hour  Intake 525.83 ml  Output      0 ml  Net 525.83 ml   Filed Weights   02/18/15 0800  Weight: 52 kg (114 lb 10.2 oz)   Body mass index is 17.95 kg/(m^2).  Exam:   General:  Adult female,  awake alert  Cardiovascular:  RRR, nl S1, S2 no mrg, 2+ pulses, warm extremities  Respiratory:  Rales and better aeration on the right chest mid-back but still very diminished at the right base  Abdomen:   NABS, soft, NT/ND  Neuro:  Diffusely weak, frail-appearing   Data Reviewed: Basic Metabolic Panel:  Recent Labs Lab 02/21/15 0527 02/22/15 0440 02/23/15 0945 02/24/15 0520 02/25/15 0655  NA 140 140 141 140 140  K 2.7* 3.8 4.1 3.8 3.2*  CL 101 102 102 103 102  CO2 '28 27 27 26 29  '$ GLUCOSE 94 87 127* 90 87  BUN <5* <5* <5* 7 <5*  CREATININE 0.45 0.46 0.58 0.42* 0.46  CALCIUM 8.0* 8.2* 9.0 8.6* 8.3*  MG 1.7 2.0 2.1 2.0 1.9  PHOS 2.6 2.8 5.0* 4.3 4.5   Liver Function Tests:  Recent Labs Lab 02/20/15 1911 02/21/15 0527 02/22/15 0440 02/23/15 0945 02/24/15 0520 02/25/15 0655  AST 59*  --   --   --   --   --   ALT 19  --   --   --   --   --   ALKPHOS 111  --   --   --   --   --   BILITOT 0.5  --   --   --   --   --   PROT 5.4*  --   --   --   --   --   ALBUMIN 1.8* 1.7* 1.9* 2.4* 2.1* 2.1*   No results for input(s): LIPASE, AMYLASE in the last 168 hours.  Recent Labs Lab  02/19/15 1115  AMMONIA 18   CBC:  Recent Labs Lab 02/21/15 0527 02/22/15 0440 02/23/15 0945 02/24/15 0520 02/25/15 0655  WBC 4.4 5.1 7.9 7.7 5.5  HGB 8.2* 10.0* 10.8* 9.0* 9.3*  HCT 25.6* 32.6* 34.7* 29.4* 30.1*  MCV 90.5 91.3 93.0 92.7 94.1  PLT 208 284 372 372 418*    Recent Results (from the past 240 hour(s))  Culture, blood (routine x 2)     Status: None   Collection Time: 02/17/15  9:55 PM  Result Value Ref Range Status   Specimen Description BLOOD LEFT ARM  Final   Special Requests BOTTLES DRAWN AEROBIC AND ANAEROBIC 5ML  Final   Culture NO GROWTH 5 DAYS  Final   Report Status 02/22/2015 FINAL  Final  Culture, blood (routine x 2)     Status: None   Collection Time: 02/17/15  9:59 PM  Result Value Ref Range Status   Specimen Description BLOOD RIGHT ARM  Final   Special Requests AEROBIC BOTTLE ONLY 5ML  Final   Culture NO GROWTH 5 DAYS  Final   Report Status 02/22/2015 FINAL  Final  Urine culture     Status: None   Collection Time: 02/17/15 10:21 PM  Result Value Ref Range Status   Specimen Description URINE, CLEAN CATCH  Final   Special Requests NONE  Final   Culture 7,000 COLONIES/mL INSIGNIFICANT GROWTH  Final   Report Status 02/19/2015 FINAL  Final  Culture, blood (single)     Status: None   Collection Time: 02/17/15 11:30 PM  Result Value Ref Range Status   Specimen Description BLOOD RIGHT ANTECUBITAL  Final   Special Requests BOTTLES DRAWN AEROBIC AND ANAEROBIC 5CC  Final   Culture NO GROWTH 5 DAYS  Final   Report Status 02/23/2015 FINAL  Final  MRSA PCR Screening     Status: None   Collection Time: 02/18/15  7:36 AM  Result Value Ref Range Status   MRSA by PCR NEGATIVE NEGATIVE Final    Comment:        The GeneXpert MRSA Assay (FDA approved for NASAL specimens only), is one component of a comprehensive MRSA colonization surveillance program. It is not intended to diagnose MRSA infection nor to guide or monitor treatment for MRSA infections.    Culture, sputum-assessment     Status: None   Collection Time: 02/19/15  8:51 PM  Result Value Ref Range Status   Specimen Description EXPECTORATED SPUTUM  Final   Special Requests Normal  Final   Sputum evaluation   Final    THIS SPECIMEN IS ACCEPTABLE. RESPIRATORY CULTURE REPORT TO FOLLOW.   Report Status 02/20/2015 FINAL  Final  Culture, respiratory (NON-Expectorated)     Status: None   Collection Time: 02/19/15  8:51 PM  Result Value Ref Range Status   Specimen Description EXPECTORATED SPUTUM  Final   Special Requests NONE  Final   Gram Stain   Final    FEW WBC PRESENT, PREDOMINANTLY PMN RARE SQUAMOUS EPITHELIAL CELLS PRESENT FEW GRAM NEGATIVE RODS FEW GRAM POSITIVE COCCI IN PAIRS FEW GRAM NEGATIVE COCCI    Culture FEW YEAST Performed at [facility]   Final   Report Status 02/23/2015 FINAL  Final     Studies: No results found.  Scheduled Meds: . acyclovir  5 mg/kg Intravenous 3 times per day  . antiseptic oral rinse  7 mL Mouth Rinse q12n4p  . atorvastatin  40 mg Oral q1800  . chlorhexidine  15 mL Mouth Rinse BID  . enoxaparin (LOVENOX) injection  40 mg Subcutaneous Q24H  . ferrous sulfate  325 mg Oral BID WC  . fluconazole (DIFLUCAN) IV  200 mg Intravenous Q24H  . magnesium chloride  1 tablet Oral BID  . mirtazapine  7.5 mg Oral QHS  . oxyCODONE  10 mg Oral TID AC & HS  . pneumococcal 23 valent vaccine  0.5 mL Intramuscular Tomorrow-1000  . tiotropium  18 mcg Inhalation Daily   Continuous Infusions:    Active Problems:   Anxiety state   Sepsis ([place])   UTI (lower urinary tract infection)  CAP (community acquired pneumonia)   Anemia   Hep C w/o coma, chronic (HCC)   Protein-calorie malnutrition, severe   Chronic pain   Acute respiratory failure with hypoxia (HCC)   AAA (abdominal aortic aneurysm) without rupture (HCC)   Nonsustained ventricular tachycardia (HCC)   Bradycardia with 41 - 50 beats per minute    Time spent: 30  min    [facility] Pager [phone]. If 7PM-7AM, please contact night-coverage at [email or web address], password [facility] 02/25/2015, 6:51 PM  LOS: 7 days

## 2015-02-25 NOTE — Progress Notes (Signed)
Pt. Arrived back from Endo. Pt. Is awake, but still somewhat drowsy. Vitals appear stable and is able to follow commands. Orders released. Will continue to monitor.

## 2015-02-26 ENCOUNTER — Encounter (HOSPITAL_COMMUNITY): Payer: Self-pay | Admitting: Gastroenterology

## 2015-02-26 LAB — RENAL FUNCTION PANEL
Albumin: 2.4 g/dL — ABNORMAL LOW (ref 3.5–5.0)
Anion gap: 10 (ref 5–15)
BUN: <5 mg/dL — ABNORMAL LOW (ref 6–20)
CHLORIDE: 100 mmol/L — AB (ref 101–111)
CO2: 28 mmol/L (ref 22–32)
CREATININE: 0.6 mg/dL (ref 0.44–1.00)
Calcium: 8.4 mg/dL — ABNORMAL LOW (ref 8.9–10.3)
GFR calc non Af Amer: >60 mL/min (ref >60)
Glucose, Bld: 90 mg/dL (ref 65–99)
Phosphorus: 4.2 mg/dL (ref 2.5–4.6)
Potassium: 3.3 mmol/L — ABNORMAL LOW (ref 3.5–5.1)
Sodium: 138 mmol/L (ref 135–145)

## 2015-02-26 LAB — CBC
HCT: 35.6 % — ABNORMAL LOW (ref 36.0–46.0)
Hemoglobin: 10.7 g/dL — ABNORMAL LOW (ref 12.0–15.0)
MCH: 28.2 pg (ref 26.0–34.0)
MCHC: 30.1 g/dL (ref 30.0–36.0)
MCV: 93.9 fL (ref 78.0–100.0)
PLATELETS: 430 10*3/uL — AB (ref 150–400)
RBC: 3.79 MIL/uL — AB (ref 3.87–5.11)
RDW: 17 % — ABNORMAL HIGH (ref 11.5–15.5)
WBC: 7 10*3/uL (ref 4.0–10.5)

## 2015-02-26 LAB — MAGNESIUM: Magnesium: 1.9 mg/dL (ref 1.7–2.4)

## 2015-02-26 MED ORDER — OXYCODONE HCL 10 MG PO TABS: 10.0000 mg | ORAL_TABLET | Freq: Two times a day (BID) | ORAL | Status: DC | PRN

## 2015-02-26 MED ORDER — POTASSIUM CHLORIDE CRYS ER 20 MEQ PO TBCR: 40.0000 meq | EXTENDED_RELEASE_TABLET | Freq: Two times a day (BID) | ORAL | Status: AC

## 2015-02-26 MED ORDER — ACYCLOVIR 400 MG PO TABS: 400.0000 mg | ORAL_TABLET | Freq: Three times a day (TID) | ORAL | Status: DC

## 2015-02-26 MED ORDER — LIDOCAINE HCL 2 % EX GEL
1.0000 "application " | Freq: Three times a day (TID) | CUTANEOUS | Status: DC
  Administered 2015-02-26: 1 via TOPICAL
  Filled 2015-02-26 (×3): qty 5

## 2015-02-26 MED ORDER — ATORVASTATIN CALCIUM 40 MG PO TABS: 40.0000 mg | ORAL_TABLET | Freq: Every day | ORAL | Status: AC

## 2015-02-26 MED ORDER — FERROUS SULFATE 325 (65 FE) MG PO TABS: 325.0000 mg | ORAL_TABLET | Freq: Two times a day (BID) | ORAL | Status: AC

## 2015-02-26 MED ORDER — TIOTROPIUM BROMIDE MONOHYDRATE 18 MCG IN CAPS: 18.0000 ug | ORAL_CAPSULE | Freq: Every day | RESPIRATORY_TRACT | Status: AC

## 2015-02-26 MED ORDER — LIDOCAINE HCL 2 % EX GEL: 1.0000 "application " | Freq: Three times a day (TID) | CUTANEOUS | Status: AC

## 2015-02-26 MED ORDER — LEVALBUTEROL HCL 0.63 MG/3ML IN NEBU: 0.6300 mg | INHALATION_SOLUTION | RESPIRATORY_TRACT | Status: AC | PRN

## 2015-02-26 MED ORDER — POTASSIUM CHLORIDE CRYS ER 20 MEQ PO TBCR
40.0000 meq | EXTENDED_RELEASE_TABLET | Freq: Two times a day (BID) | ORAL | Status: DC
  Administered 2015-02-26: 40 meq via ORAL
  Filled 2015-02-26: qty 2

## 2015-02-26 NOTE — Discharge Summary (Signed)
Physician Discharge Summary  DAIYA TAMER PYP:[id number] DOB: 10/22/1955 DOA: 02/17/2015  PCP: Lorelee Market, MD  Admit date: 02/17/2015 Discharge date: 02/26/2015  Time spent: 30 minutes  Recommendations for Outpatient Follow-up:  [place] PT RN as recommended.  Please follow up with PCP in one week. Please follow up the results of the biopsy either at PCP office or Dr Benson Norway office. Follow up with vascular surgery as recommended for AAA.  Recommend follow up CXR in 4 weeks.    Discharge Diagnoses:  Active Problems:   Anxiety state   Sepsis ([place])   UTI (lower urinary tract infection)   CAP (community acquired pneumonia)   Anemia   Hep C w/o coma, chronic (HCC)   Protein-calorie malnutrition, severe   Chronic pain   Acute respiratory failure with hypoxia (HCC)   AAA (abdominal aortic aneurysm) without rupture (HCC)   Nonsustained ventricular tachycardia (HCC)   Bradycardia with 41 - 50 beats per minute   Discharge Condition: improved  Diet recommendation: regular diet.   Filed Weights   02/18/15 0800  Weight: 52 kg (114 lb 10.2 oz)    History of present illness:  CHELLY DOMBECK is a 60 y.o. Female with history of Anxiety; Chronic obstructive pulmonary disease ([place]); Depression; Nicotine dependence, hepatitis C; Hypertension; GERD; and Uterus cancer ([place]) presented with a several day history of generalized weakness, fevers, nausea and vomiting associated with coughing, and sob.  Emergency department eval, she was found to be hypoxic down to 84 on RA and have lactic acid of 2.46 or worrisome for pneumonia of small right pleural effusion. She was admitted for sepsis. Hospitalist was called for admission for sepsis likely secondary to community-acquired pneumonia.  Hospital Course:  Sepsis and acute hypoxic respiratory failure secondary to right lower lobe pneumonia and acute COPD exacerbation, resolving. She was initially started on broad-spectrum  antibiotics with vancomycin, ceftriaxone, and azithromycin. Her Legionella and strep pneumo antigens were negative. Her azithromycin was discontinued. After cultures were no growth to date 48 hours, her vancomycin was also discontinued.  - Blood cultures no growth to date - Sputum culture, yeast - completed the course of antibiotics.   Acute COPD exacerbation with wheezing - D/c prednisone burst - Continue duo nebs - resolved.  Mild somnolence, resolved. ABG without hypercapnea and ammonia level wnl - Hold sedating medications and but Xanax prn   UTI ruled out, UCx with less than 7000 colonies, negative  Nausea and vomiting, only one episode of emesis, resolved  Atypical chest pains are likely related to right sided pneumonia, persistent.  - Continue additional prn oxycodone doses - NSAID allergy  Mildly elevated troponins, likely demand ischemia from severe pneumonia. Has ST segment depressions in the inferolateral leads which appear similar from one ECG to next - Tele: Nonsustained V-tach, SVT and pauses 2.8 sec - ASA allergy  - Started on beta blocker and statin - ECHO: Ejection fraction 65-70% with no regional wall motion abnormalities, peak PA pressure 41 mmHg - Recommend outpatient stress test/cardiology consult   Possible sick sinus syndrome and nonsustained V. Tach, up to 20 beats which was recurrent >> started bisoprolol but started having pauses of 2.8 seconds and bradycardia.  D/c bisoprolol.  No further work up needed at this time as per cardiology.   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   Severe protein calorie malnutrition - Appreciate Nutrition consultation - Regular diet with supplements - Trial of remeron at  night  Thrush and oral stomatitis, minimally improved despite 5 days of treatment - Requested GI consult for possible EGD To evaluate for esophageal candidiasis vs viral etiology.  - empirically started  on acyclovir.  EGD shows esophagitis, possibly viral , antral ulcers are healing. Biopsies taken and resume acyclovir.  - please follow up with Dr Benson Norway re the biopsies.   Hypokalemia and hypomagnesemia, likely due to malnutrition - repleted as needed.   Calcium corrects with albumin  Anxiety, insomnia, malnutrition - continue low dose remeron  - Continue xanax   AAA, managed by vascular surgery as outpatient   Procedures:  EGD by Dr Benson Norway.   Consultations:  Gastroenterology  Cardiology.   Discharge Exam: Filed Vitals:   02/25/15 2159 02/26/15 0558  BP: 130/57 118/52  Pulse: 84 62  Temp: 98.7 F (37.1 C) 98.5 F (36.9 C)  Resp: 20 16    General: ALERT AFEBRILE COMFORTABLE Cardiovascular: s1s2 Respiratory: ctab.  Discharge Instructions   Discharge Instructions    Diet - low sodium heart healthy    Complete by:  As directed      Discharge instructions    Complete by:  As directed   [place] PT RN as recommended.  Please follo wup with PCP in one week. Please follow up the results of the biopsy either at PCP office or Dr Benson Norway office.          Current Discharge Medication List    START taking these medications   Details  acyclovir (ZOVIRAX) 400 MG tablet Take 1 tablet (400 mg total) by mouth 3 (three) times daily. Qty: 30 tablet, Refills: 0    atorvastatin (LIPITOR) 40 MG tablet Take 1 tablet (40 mg total) by mouth daily at 6 PM.    ferrous sulfate 325 (65 FE) MG tablet Take 1 tablet (325 mg total) by mouth 2 (two) times daily with a meal. Qty: 90 tablet, Refills: 3    levalbuterol (XOPENEX) 0.63 MG/3ML nebulizer solution Take 3 mLs (0.63 mg total) by nebulization every 2 (two) hours as needed for wheezing. Qty: 3 mL, Refills:  2    lidocaine (XYLOCAINE) 2 % jelly Apply 1 application topically 3 (three) times daily. Qty: 30 mL, Refills: 0    potassium chloride SA (K-DUR,KLOR-CON) 20 MEQ tablet Take 2 tablets (40 mEq total) by mouth 2 (two) times daily. Qty: 4 tablet, Refills: 0    tiotropium (SPIRIVA) 18 MCG inhalation capsule Place 1 capsule (18 mcg total) into inhaler and inhale daily. Qty: 30 capsule, Refills: 2      CONTINUE these medications which have CHANGED   Details  Oxycodone HCl 10 MG TABS Take 1 tablet (10 mg total) by mouth 2 (two) times daily as needed. Qty: 10 tablet, Refills: 0      CONTINUE these medications which have NOT CHANGED   Details  ALPRAZolam (XANAX) 0.5 MG tablet Take 0.5 mg by mouth 2 (two) times daily.    aspirin EC 81 MG tablet Take 81 mg by mouth every 6 (six) hours as needed for mild pain.       Allergies  Allergen Reactions  . Mucinex [Guaifenesin Er] Anaphylaxis  . Nsaids Anaphylaxis   Follow-up Information    Follow up with [facility].   Why:  HHRN for PNA  Contact information:   [address] [place] [address] [phone]       Follow up with Lorelee Market, MD. Schedule an appointment as soon as possible for a visit in 1 week.   Specialty:  Family Medicine   Why:  follow up the biopsy results.    Contact information:   [place] [address] [phone]       Follow up with HUNG,PATRICK D, MD. Schedule an appointment as soon as possible for a visit in 1 week.   Specialty:  Gastroenterology   Why:  follow up the esophageal biopsy results.    Contact information:   [place], [place] [address] [phone]        The results of significant diagnostics from this hospitalization (including imaging, microbiology, ancillary and laboratory) are listed below for reference.    Significant Diagnostic Studies: Dg Chest 2 View  02/17/2015  CLINICAL DATA:  60 year old female with  confusion and disorientation and fever. EXAM: CHEST  2 VIEW COMPARISON:  Chest radiograph dated 03/01/2012 FINDINGS: Two views of the chest demonstrate multiple patchy areas of increased opacity in the mid to lower right lower lung field compatible with pneumonia. There is a small right pleural effusion. The left lung is clear. There is no pneumothorax. The cardiac silhouette is within normal limits. The osseous structures appear unremarkable. IMPRESSION: Pneumonia. Small right pleural effusion. Clinical correlation and follow-up recommended. Electronically Signed   By: Anner Crete M.D.   On: 02/17/2015 22:47    Microbiology: Recent Results (from the past 240 hour(s))  Culture, blood (routine x 2)     Status: None   Collection Time: 02/17/15  9:55 PM  Result Value Ref Range Status   Specimen Description BLOOD LEFT ARM  Final   Special Requests BOTTLES DRAWN AEROBIC AND ANAEROBIC 5ML  Final   Culture NO GROWTH 5 DAYS  Final   Report Status 02/22/2015 FINAL  Final  Culture, blood (routine x 2)     Status: None   Collection Time: 02/17/15  9:59 PM  Result Value Ref Range Status   Specimen Description BLOOD RIGHT ARM  Final   Special Requests AEROBIC BOTTLE ONLY 5ML  Final   Culture NO GROWTH 5 DAYS  Final   Report Status 02/22/2015 FINAL  Final  Urine culture     Status: None   Collection Time: 02/17/15 10:21 PM  Result Value Ref Range Status   Specimen Description URINE, CLEAN CATCH  Final   Special Requests NONE  Final   Culture 7,000 COLONIES/mL INSIGNIFICANT GROWTH  Final   Report Status 02/19/2015 FINAL  Final  Culture, blood (single)     Status: None   Collection Time: 02/17/15 11:30 PM  Result Value Ref Range Status   Specimen Description BLOOD RIGHT ANTECUBITAL  Final   Special Requests BOTTLES DRAWN AEROBIC AND ANAEROBIC 5CC  Final   Culture NO GROWTH 5 DAYS  Final   Report Status 02/23/2015 FINAL  Final  MRSA PCR Screening     Status: None   Collection Time: 02/18/15   7:36 AM  Result Value Ref Range Status   MRSA by PCR NEGATIVE NEGATIVE Final    Comment:        The GeneXpert MRSA Assay (FDA approved for NASAL specimens only), is one component of a comprehensive MRSA colonization surveillance program. It is not intended to diagnose MRSA infection nor to guide or monitor treatment for MRSA infections.   Culture, sputum-assessment  Status: None   Collection Time: 02/19/15  8:51 PM  Result Value Ref Range Status   Specimen Description EXPECTORATED SPUTUM  Final   Special Requests Normal  Final   Sputum evaluation   Final    THIS SPECIMEN IS ACCEPTABLE. RESPIRATORY CULTURE REPORT TO FOLLOW.   Report Status 02/20/2015 FINAL  Final  Culture, respiratory (NON-Expectorated)     Status: None   Collection Time: 02/19/15  8:51 PM  Result Value Ref Range Status   Specimen Description EXPECTORATED SPUTUM  Final   Special Requests NONE  Final   Gram Stain   Final    FEW WBC PRESENT, PREDOMINANTLY PMN RARE SQUAMOUS EPITHELIAL CELLS PRESENT FEW GRAM NEGATIVE RODS FEW GRAM POSITIVE COCCI IN PAIRS FEW GRAM NEGATIVE COCCI    Culture FEW YEAST Performed at [facility]   Final   Report Status 02/23/2015 FINAL  Final     Labs: Basic Metabolic Panel:  Recent Labs Lab 02/22/15 0440 02/23/15 0945 02/24/15 0520 02/25/15 0655 02/26/15 0728  NA 140 141 140 140 138  K 3.8 4.1 3.8 3.2* 3.3*  CL 102 102 103 102 100*  CO2 '27 27 26 29 28  '$ GLUCOSE 87 127* 90 87 90  BUN <5* <5* 7 <5* <5*  CREATININE 0.46 0.58 0.42* 0.46 0.60  CALCIUM 8.2* 9.0 8.6* 8.3* 8.4*  MG 2.0 2.1 2.0 1.9 1.9  PHOS 2.8 5.0* 4.3 4.5 4.2   Liver Function Tests:  Recent Labs Lab 02/20/15 1911  02/22/15 0440 02/23/15 0945 02/24/15 0520 02/25/15 0655 02/26/15 0728  AST 59*  --   --   --   --   --   --   ALT 19  --   --   --   --   --   --   ALKPHOS 111  --   --   --   --   --   --   BILITOT 0.5  --   --   --   --   --   --   PROT 5.4*  --   --   --   --   --    --   ALBUMIN 1.8*  < > 1.9* 2.4* 2.1* 2.1* 2.4*  < > = values in this interval not displayed. No results for input(s): LIPASE, AMYLASE in the last 168 hours.  Recent Labs Lab 02/19/15 1115  AMMONIA 18   CBC:  Recent Labs Lab 02/22/15 0440 02/23/15 0945 02/24/15 0520 02/25/15 0655 02/26/15 0728  WBC 5.1 7.9 7.7 5.5 7.0  HGB 10.0* 10.8* 9.0* 9.3* 10.7*  HCT 32.6* 34.7* 29.4* 30.1* 35.6*  MCV 91.3 93.0 92.7 94.1 93.9  PLT 284 372 372 418* 430*   Cardiac Enzymes: No results for input(s): CKTOTAL, CKMB, CKMBINDEX, TROPONINI in the last 168 hours. BNP: BNP (last 3 results) No results for input(s): BNP in the last 8760 hours.  ProBNP (last 3 results) No results for input(s): PROBNP in the last 8760 hours.  CBG: No results for input(s): GLUCAP in the last 168 hours.     SignedHosie Poisson MD  FACP  [facility] 02/26/2015, 10:54 AM

## 2015-02-26 NOTE — Progress Notes (Signed)
Lyanne Co to be D/C'd Home per MD order. Discussed with the patient and all questions fully answered.  VSS, Skin clean, dry and intact without evidence of skin break down, no evidence of skin tears noted. IV catheter discontinued intact. Site without signs and symptoms of complications. Dressing and pressure applied.  An After Visit Summary was printed and given to the patient. Prescriptions given to the patient.  D/c education completed with patient/family including follow up instructions, medication list, d/c activities limitations if indicated, with other d/c instructions as indicated by MD - patient able to verbalize understanding, all questions fully answered.   Patient instructed to return to ED, call 911, or call MD for any changes in condition.   Patient escorted via W/C with nurse tech, and D/C home via private auto with significant other.

## 2015-02-26 NOTE — Progress Notes (Signed)
Subjective: She wants to go home.  Swallowing is sore.  Objective: Vital signs in last 24 hours: Temp:  [98.1 F (36.7 C)-98.7 F (37.1 C)] 98.5 F (36.9 C) (01/06 0558) Pulse Rate:  [56-122] 62 (01/06 0558) Resp:  [14-28] 16 (01/06 0558) BP: (98-176)/(44-124) 118/52 mmHg (01/06 0558) SpO2:  [90 %-99 %] 91 % (01/06 0558) Last BM Date: 02/25/15  Intake/Output from previous day:   Intake/Output this shift:    General appearance: alert and no distress GI: soft, non-tender; bowel sounds normal; no masses,  no organomegaly  Lab Results:  Recent Labs  02/23/15 0945 02/24/15 0520 02/25/15 0655  WBC 7.9 7.7 5.5  HGB 10.8* 9.0* 9.3*  HCT 34.7* 29.4* 30.1*  PLT 372 372 418*   BMET  Recent Labs  02/23/15 0945 02/24/15 0520 02/25/15 0655  NA 141 140 140  K 4.1 3.8 3.2*  CL 102 103 102  CO2 '27 26 29  '$ GLUCOSE 127* 90 87  BUN <5* 7 <5*  CREATININE 0.58 0.42* 0.46  CALCIUM 9.0 8.6* 8.3*   LFT  Recent Labs  02/25/15 0655  ALBUMIN 2.1*   PT/INR No results for input(s): LABPROT, INR in the last 72 hours. Hepatitis Panel No results for input(s): HEPBSAG, HCVAB, HEPAIGM, HEPBIGM in the last 72 hours. C-Diff No results for input(s): CDIFFTOX in the last 72 hours. Fecal Lactopherrin No results for input(s): FECLLACTOFRN in the last 72 hours.  Studies/Results: No results found.  Medications:  Scheduled: . acyclovir  5 mg/kg Intravenous 3 times per day  . antiseptic oral rinse  7 mL Mouth Rinse q12n4p  . atorvastatin  40 mg Oral q1800  . chlorhexidine  15 mL Mouth Rinse BID  . enoxaparin (LOVENOX) injection  40 mg Subcutaneous Q24H  . ferrous sulfate  325 mg Oral BID WC  . fluconazole (DIFLUCAN) IV  200 mg Intravenous Q24H  . magnesium chloride  1 tablet Oral BID  . mirtazapine  7.5 mg Oral QHS  . oxyCODONE  10 mg Oral TID AC & HS  . pneumococcal 23 valent vaccine  0.5 mL Intramuscular Tomorrow-1000  . tiotropium  18 mcg Inhalation Daily   Continuous:    Assessment/Plan: 1) Esophagitis - ? HSV. 2) Odynophagia.   The patient feels that it is more stressful to stay in the hospital than compared to being discharged.  I would prefer that she stay in the hospital to check her response with treatment.  The biopsy results may not be available until early next week.  I started her on acyclovir yesterday and I know that she was an Valtrex.  I have not used Valtrex for this issue, but in theory it should be the same efficacy.  Regardless, if she wants to go home it is her decision.  She can be discharged home with a 10 day course of acyclovir 400 mg TID.  She states that she can swallow fine, but it is sore for her to swallow.  Plan: 1) Continue acyclovir. 2) Lidocaine 2% gel PO TID if she desires. 3) Signing off.  LOS: 8 days   Alta Goding D 02/26/2015, 7:07 AM

## 2015-02-26 NOTE — Progress Notes (Signed)
Physical Therapy Treatment Patient Details Name: [name] MRN: [id number] DOB: 26-Oct-1955 Today's Date: 02/26/2015    History of Present Illness [name] is a 60 y.o. Female with history of Anxiety; Chronic obstructive pulmonary disease ([place]); Depression; Nicotine dependence, hepatitis C; Hypertension; GERD; and Uterus cancer ([place]) who presented with a several day history of generalized weakness, fevers, nausea and vomiting.  Pt admitted with sepsis.    PT Comments    Patient progressing well this session. Continues to ambulate with slow gait but is very safe. Daughter present and will be assisting at home as needed. COntinue with current POC  Follow Up Recommendations  Home health PT;Supervision for mobility/OOB     Equipment Recommendations  Rolling walker with 5" wheels    Recommendations for Other Services       Precautions / Restrictions Precautions Precautions: Fall    Mobility  Bed Mobility Overal bed mobility: Modified Independent                Transfers Overall transfer level: Modified independent                  Ambulation/Gait Ambulation/Gait assistance: Supervision Ambulation Distance (Feet): 160 Feet Assistive device: Rolling walker (2 wheeled)     Gait velocity interpretation: Below normal speed for age/gender General Gait Details: Better posture noted. Decreased gait speed but no resting breaks required   Stairs            Wheelchair Mobility    Modified Rankin (Stroke Patients Only)       Balance                                    Cognition Arousal/Alertness: Awake/alert Behavior During Therapy: WFL for tasks assessed/performed Overall Cognitive Status: Within Functional Limits for tasks assessed                      Exercises      General Comments        Pertinent Vitals/Pain Faces Pain Scale: Hurts little more Pain Location: back Pain Descriptors / Indicators: Sore Pain  Intervention(s): Monitored during session;Repositioned    Home Living                      Prior Function            PT Goals (current goals can now be found in the care plan section) Progress towards PT goals: Progressing toward goals    Frequency  Min 3X/week    PT Plan Current plan remains appropriate    Co-evaluation             End of Session   Activity Tolerance: Patient tolerated treatment well Patient left: in chair;with call bell/phone within reach     Time: 0801-0825 PT Time Calculation (min) (ACUTE ONLY): 24 min  Charges:  $Gait Training: 8-22 mins $Therapeutic Activity: 8-22 mins                    G Codes:      [name] 02/26/2015, 8:32 AM 02/26/2015 Robinette, Tonia Brooms PTA

## 2015-09-03 ENCOUNTER — Telehealth: Payer: Self-pay | Admitting: Gastroenterology

## 2015-09-03 NOTE — Telephone Encounter (Signed)
Left voice message for patient to call and schedule appointment with Dr. Allen Norris for Hep C

## 2015-09-06 ENCOUNTER — Telehealth: Payer: Self-pay | Admitting: Gastroenterology

## 2015-09-06 NOTE — Telephone Encounter (Signed)
Left a message with patients daughter to call and schedule an appointment with Dr. Allen Norris.

## 2015-09-08 ENCOUNTER — Telehealth: Payer: Self-pay | Admitting: Gastroenterology

## 2015-09-08 NOTE — Telephone Encounter (Signed)
Left voice message for patient to call and schedule appointment with Dr. Allen Norris for Hep C

## 2016-06-19 ENCOUNTER — Inpatient Hospital Stay (HOSPITAL_COMMUNITY)
Admission: EM | Admit: 2016-06-19 | Discharge: 2016-06-24 | DRG: 040 | Disposition: A | Discharge status: Home Health | Payer: Medicaid Other | Attending: Internal Medicine | Admitting: Internal Medicine

## 2016-06-19 ENCOUNTER — Emergency Department (HOSPITAL_COMMUNITY): Payer: Medicaid Other

## 2016-06-19 ENCOUNTER — Encounter (HOSPITAL_COMMUNITY): Payer: Self-pay | Admitting: Emergency Medicine

## 2016-06-19 DIAGNOSIS — M810 Age-related osteoporosis without current pathological fracture: Secondary | ICD-10-CM | POA: Diagnosis present

## 2016-06-19 DIAGNOSIS — G40409 Other generalized epilepsy and epileptic syndromes, not intractable, without status epilepticus: Principal | ICD-10-CM | POA: Diagnosis present

## 2016-06-19 DIAGNOSIS — I714 Abdominal aortic aneurysm, without rupture, unspecified: Secondary | ICD-10-CM | POA: Diagnosis present

## 2016-06-19 DIAGNOSIS — Z90710 Acquired absence of both cervix and uterus: Secondary | ICD-10-CM

## 2016-06-19 DIAGNOSIS — R918 Other nonspecific abnormal finding of lung field: Secondary | ICD-10-CM

## 2016-06-19 DIAGNOSIS — C7931 Secondary malignant neoplasm of brain: Secondary | ICD-10-CM | POA: Diagnosis present

## 2016-06-19 DIAGNOSIS — Z79899 Other long term (current) drug therapy: Secondary | ICD-10-CM

## 2016-06-19 DIAGNOSIS — R900 Intracranial space-occupying lesion found on diagnostic imaging of central nervous system: Secondary | ICD-10-CM

## 2016-06-19 DIAGNOSIS — K7460 Unspecified cirrhosis of liver: Secondary | ICD-10-CM | POA: Diagnosis present

## 2016-06-19 DIAGNOSIS — R229 Localized swelling, mass and lump, unspecified: Secondary | ICD-10-CM

## 2016-06-19 DIAGNOSIS — IMO0002 Reserved for concepts with insufficient information to code with codable children: Secondary | ICD-10-CM

## 2016-06-19 DIAGNOSIS — C775 Secondary and unspecified malignant neoplasm of intrapelvic lymph nodes: Secondary | ICD-10-CM | POA: Diagnosis present

## 2016-06-19 DIAGNOSIS — C3490 Malignant neoplasm of unspecified part of unspecified bronchus or lung: Secondary | ICD-10-CM | POA: Diagnosis present

## 2016-06-19 DIAGNOSIS — Z807 Family history of other malignant neoplasms of lymphoid, hematopoietic and related tissues: Secondary | ICD-10-CM

## 2016-06-19 DIAGNOSIS — G9389 Other specified disorders of brain: Secondary | ICD-10-CM

## 2016-06-19 DIAGNOSIS — I10 Essential (primary) hypertension: Secondary | ICD-10-CM | POA: Diagnosis present

## 2016-06-19 DIAGNOSIS — Z833 Family history of diabetes mellitus: Secondary | ICD-10-CM

## 2016-06-19 DIAGNOSIS — E785 Hyperlipidemia, unspecified: Secondary | ICD-10-CM | POA: Diagnosis present

## 2016-06-19 DIAGNOSIS — Z8542 Personal history of malignant neoplasm of other parts of uterus: Secondary | ICD-10-CM

## 2016-06-19 DIAGNOSIS — Z7951 Long term (current) use of inhaled steroids: Secondary | ICD-10-CM

## 2016-06-19 DIAGNOSIS — E876 Hypokalemia: Secondary | ICD-10-CM

## 2016-06-19 DIAGNOSIS — F17210 Nicotine dependence, cigarettes, uncomplicated: Secondary | ICD-10-CM | POA: Diagnosis present

## 2016-06-19 DIAGNOSIS — J449 Chronic obstructive pulmonary disease, unspecified: Secondary | ICD-10-CM | POA: Diagnosis present

## 2016-06-19 DIAGNOSIS — N390 Urinary tract infection, site not specified: Secondary | ICD-10-CM | POA: Diagnosis present

## 2016-06-19 DIAGNOSIS — G939 Disorder of brain, unspecified: Secondary | ICD-10-CM

## 2016-06-19 DIAGNOSIS — R569 Unspecified convulsions: Secondary | ICD-10-CM

## 2016-06-19 DIAGNOSIS — K219 Gastro-esophageal reflux disease without esophagitis: Secondary | ICD-10-CM | POA: Diagnosis present

## 2016-06-19 DIAGNOSIS — D509 Iron deficiency anemia, unspecified: Secondary | ICD-10-CM | POA: Diagnosis present

## 2016-06-19 DIAGNOSIS — G936 Cerebral edema: Secondary | ICD-10-CM | POA: Diagnosis present

## 2016-06-19 DIAGNOSIS — R161 Splenomegaly, not elsewhere classified: Secondary | ICD-10-CM | POA: Diagnosis present

## 2016-06-19 DIAGNOSIS — Z800 Family history of malignant neoplasm of digestive organs: Secondary | ICD-10-CM

## 2016-06-19 DIAGNOSIS — Z7982 Long term (current) use of aspirin: Secondary | ICD-10-CM

## 2016-06-19 DIAGNOSIS — Z9049 Acquired absence of other specified parts of digestive tract: Secondary | ICD-10-CM

## 2016-06-19 LAB — URINALYSIS, ROUTINE W REFLEX MICROSCOPIC
Bilirubin Urine: NEGATIVE
Glucose, UA: NEGATIVE mg/dL
KETONES UR: NEGATIVE mg/dL
Nitrite: NEGATIVE
PROTEIN: NEGATIVE mg/dL
Specific Gravity, Urine: 1.01 (ref 1.005–1.030)
pH: 5 (ref 5.0–8.0)

## 2016-06-19 LAB — BASIC METABOLIC PANEL
Anion gap: 11 (ref 5–15)
CALCIUM: 8.5 mg/dL — AB (ref 8.9–10.3)
CO2: 21 mmol/L — ABNORMAL LOW (ref 22–32)
CREATININE: 0.51 mg/dL (ref 0.44–1.00)
Chloride: 104 mmol/L (ref 101–111)
GFR calc Af Amer: >60 mL/min (ref >60)
GLUCOSE: 103 mg/dL — AB (ref 65–99)
POTASSIUM: 3.3 mmol/L — AB (ref 3.5–5.1)
Sodium: 136 mmol/L (ref 135–145)

## 2016-06-19 LAB — ACETAMINOPHEN LEVEL: Acetaminophen (Tylenol), Serum: <10 ug/mL — ABNORMAL LOW (ref 10–30)

## 2016-06-19 LAB — CBG MONITORING, ED: Glucose-Capillary: 122 mg/dL — ABNORMAL HIGH (ref 65–99)

## 2016-06-19 LAB — I-STAT CG4 LACTIC ACID, ED: LACTIC ACID, VENOUS: 1.39 mmol/L (ref 0.5–1.9)

## 2016-06-19 LAB — I-STAT CHEM 8, ED
BUN: <3 mg/dL — ABNORMAL LOW (ref 6–20)
CALCIUM ION: 1.06 mmol/L — AB (ref 1.15–1.40)
CHLORIDE: 105 mmol/L (ref 101–111)
Creatinine, Ser: 0.4 mg/dL — ABNORMAL LOW (ref 0.44–1.00)
GLUCOSE: 103 mg/dL — AB (ref 65–99)
HCT: 38 % (ref 36.0–46.0)
Hemoglobin: 12.9 g/dL (ref 12.0–15.0)
Potassium: 3.3 mmol/L — ABNORMAL LOW (ref 3.5–5.1)
Sodium: 138 mmol/L (ref 135–145)
TCO2: 24 mmol/L (ref 0–100)

## 2016-06-19 LAB — HEPATIC FUNCTION PANEL
ALBUMIN: 3.3 g/dL — AB (ref 3.5–5.0)
ALT: 11 U/L — AB (ref 14–54)
AST: 27 U/L (ref 15–41)
Alkaline Phosphatase: 89 U/L (ref 38–126)
BILIRUBIN TOTAL: 0.4 mg/dL (ref 0.3–1.2)
Total Protein: 7.1 g/dL (ref 6.5–8.1)

## 2016-06-19 LAB — ETHANOL: Alcohol, Ethyl (B): <5 mg/dL (ref <5)

## 2016-06-19 LAB — I-STAT TROPONIN, ED: Troponin i, poc: 0 ng/mL (ref 0.00–0.08)

## 2016-06-19 LAB — CBC
HCT: 35.8 % — ABNORMAL LOW (ref 36.0–46.0)
Hemoglobin: 11.7 g/dL — ABNORMAL LOW (ref 12.0–15.0)
MCH: 31 pg (ref 26.0–34.0)
MCHC: 32.7 g/dL (ref 30.0–36.0)
MCV: 95 fL (ref 78.0–100.0)
PLATELETS: 217 10*3/uL (ref 150–400)
RBC: 3.77 MIL/uL — ABNORMAL LOW (ref 3.87–5.11)
RDW: 14.3 % (ref 11.5–15.5)
WBC: 5.9 10*3/uL (ref 4.0–10.5)

## 2016-06-19 LAB — RAPID URINE DRUG SCREEN, HOSP PERFORMED
AMPHETAMINES: NOT DETECTED
BENZODIAZEPINES: NOT DETECTED
Barbiturates: NOT DETECTED
COCAINE: NOT DETECTED
OPIATES: POSITIVE — AB
TETRAHYDROCANNABINOL: NOT DETECTED

## 2016-06-19 LAB — SALICYLATE LEVEL: Salicylate Lvl: 9.8 mg/dL (ref 2.8–30.0)

## 2016-06-19 LAB — LIPASE, BLOOD: Lipase: 30 U/L (ref 11–51)

## 2016-06-19 LAB — MAGNESIUM: MAGNESIUM: 1.4 mg/dL — AB (ref 1.7–2.4)

## 2016-06-19 MED ORDER — POTASSIUM CHLORIDE CRYS ER 20 MEQ PO TBCR
40.0000 meq | EXTENDED_RELEASE_TABLET | Freq: Once | ORAL | Status: DC
  Filled 2016-06-19: qty 2

## 2016-06-19 MED ORDER — MAGNESIUM SULFATE 2 GM/50ML IV SOLN
2.0000 g | Freq: Once | INTRAVENOUS | Status: AC
  Administered 2016-06-20: 2 g via INTRAVENOUS
  Filled 2016-06-19: qty 50

## 2016-06-19 MED ORDER — SODIUM CHLORIDE 0.9 % IV BOLUS (SEPSIS)
1000.0000 mL | Freq: Once | INTRAVENOUS | Status: AC
  Administered 2016-06-19: 1000 mL via INTRAVENOUS

## 2016-06-19 MED ORDER — IOPAMIDOL (ISOVUE-370) INJECTION 76%
INTRAVENOUS | Status: AC
  Administered 2016-06-19: 100 mL
  Filled 2016-06-19: qty 100

## 2016-06-19 MED ORDER — ONDANSETRON HCL 4 MG/2ML IJ SOLN
4.0000 mg | Freq: Once | INTRAMUSCULAR | Status: AC
  Administered 2016-06-19: 4 mg via INTRAVENOUS
  Filled 2016-06-19: qty 2

## 2016-06-19 NOTE — ED Notes (Signed)
Patient transported to CT 

## 2016-06-19 NOTE — ED Triage Notes (Signed)
Per EMS, pt from home. Pt had a witnessed seizure at 2030 by family. No hx of szs. About 5 minutes long, pt started shaking violently. Pt reports she did not hit her head. After the seizure pt had confusion, was a&o to self, birthday and address - not year. EMS VS 94 HR, 99% RA, CBG 136.   Pt also reports vaginal bleeding from pelvic mesh past 2 months and vomiting past few weeks. Pt had an internal vaginal exam 3 wks ago with nothing reported wrong.    Pt's family reports hx of aortic aneurism.

## 2016-06-19 NOTE — ED Provider Notes (Signed)
[name] DEPT Provider Note   CSN: [id number] Arrival date & time: 06/19/16  2119     History   Chief Complaint Chief Complaint  Patient presents with  . Seizures    HPI [name] is a 61 y.o. female.  The history is provided by the patient, a relative and the spouse.  Seizures   This is a new problem. The current episode started less than 1 hour ago. The problem has been resolved. There was 1 seizure. The most recent episode lasted 2 to 5 minutes. Associated symptoms include confusion. Pertinent negatives include no headaches, no speech difficulty, no visual disturbance, no neck stiffness, no sore throat, no chest pain, no cough, no nausea, no vomiting, no diarrhea and no muscle weakness. Characteristics include rhythmic jerking and apnea. Characteristics do not include bowel incontinence or bladder incontinence. The episode was witnessed. The seizures did not continue in the ED. The seizure(s) had no focality. Possible causes do not include medication or dosage change, sleep deprivation, recent illness or change in alcohol use. There has been no fever. There were no medications administered prior to arrival.    Past Medical History:  Diagnosis Date  . Age-related osteoporosis without current pathological fracture   . Anxiety   . Aortic aneurysm, abdominal (HCC) 9 cm  . Chronic obstructive pulmonary disease ([place])   . Depression   . Exposure of implanted vaginal mesh and prosthetic material in vagina ([place])   . GERD (gastroesophageal reflux disease)   . Hypertension   . Nicotine dependence   . Uterus cancer ([place])   . Viral hepatitis C   . Vitamin D deficiency     Patient Active Problem List   Diagnosis Date Noted  . Nonsustained ventricular tachycardia ([place]) 02/23/2015  . Bradycardia with 41 - 50 beats per minute 02/23/2015  . Chronic pain 02/21/2015  . Acute respiratory failure with hypoxia ([place]) 02/21/2015  . AAA (abdominal aortic aneurysm) without rupture  ([place]) 02/21/2015  . Protein-calorie malnutrition, severe 02/19/2015  . Sepsis ([place]) 02/18/2015  . UTI (lower urinary tract infection) 02/18/2015  . CAP (community acquired pneumonia) 02/18/2015  . Anemia 02/18/2015  . Hep C w/o coma, chronic ([place]) 02/18/2015  . Severe sepsis ([place])   . Anxiety state 09/28/2014  . Asthma, chronic 09/28/2014  . Cervicalgia 09/28/2014  . Chronic airway obstruction ([place]) 09/28/2014  . Depressive disorder 09/28/2014  . Insomnia 09/28/2014  . Lumbago 09/28/2014  . Osteoporosis 09/28/2014  . Vitamin D deficiency 09/28/2014    Past Surgical History:  Procedure Laterality Date  . ABDOMINAL HYSTERECTOMY    . BLADDER SURGERY    . CHOLECYSTECTOMY    . ESOPHAGOGASTRODUODENOSCOPY N/A 02/25/2015   Procedure: ESOPHAGOGASTRODUODENOSCOPY (EGD);  Surgeon: Carol Ada, MD;  Location: [facility] ENDOSCOPY;  Service: Endoscopy;  Laterality: N/A;  . NEUROPLASTY / TRANSPOSITION MEDIAN NERVE AT CARPAL TUNNEL    . RECONSTRUCTION OF NOSE    . TONSILLECTOMY    . TUBAL LIGATION    . TUMOR REMOVAL     Nose    OB History    No data available       Home Medications    Prior to Admission medications   Medication Sig Start Date End Date Taking? Authorizing Provider  ALPRAZolam Duanne Moron) 0.5 MG tablet Take 0.5 mg by mouth 2 (two) times daily as needed for anxiety.    Yes Historical Provider, MD  aspirin EC 81 MG tablet Take 81 mg by mouth every 6 (six) hours as needed for mild  pain.   Yes Historical Provider, MD  atorvastatin (LIPITOR) 40 MG tablet Take 1 tablet (40 mg total) by mouth daily at 6 PM. 02/26/15  Yes Hosie Poisson, MD  Fenofibrate (TRICOR PO) Take 1 tablet by mouth daily.   Yes Historical Provider, MD  ferrous sulfate 325 (65 FE) MG tablet Take 1 tablet (325 mg total) by mouth 2 (two) times daily with a meal. 02/26/15  Yes Hosie Poisson, MD  levalbuterol (XOPENEX) 0.63 MG/3ML nebulizer solution Take 3 mLs (0.63 mg total) by nebulization every 2 (two) hours as needed for  wheezing. 02/26/15  Yes Hosie Poisson, MD  lidocaine (XYLOCAINE) 2 % jelly Apply 1 application topically 3 (three) times daily. Patient taking differently: Apply 1 application topically 3 (three) times daily as needed (pain).  02/26/15  Yes Hosie Poisson, MD  Oxycodone HCl 10 MG TABS Take 1 tablet (10 mg total) by mouth 2 (two) times daily as needed. Patient taking differently: Take 10 mg by mouth 2 (two) times daily as needed (pain).  02/26/15  Yes Hosie Poisson, MD  potassium chloride SA (K-DUR,KLOR-CON) 20 MEQ tablet Take 2 tablets (40 mEq total) by mouth 2 (two) times daily. 02/26/15  Yes Hosie Poisson, MD  tiotropium (SPIRIVA) 18 MCG inhalation capsule Place 1 capsule (18 mcg total) into inhaler and inhale daily. 02/26/15  Yes Hosie Poisson, MD    Family History Family History  Problem Relation Age of Onset  . Diabetes Mother   . Breast cancer Mother   . Liver cancer Brother     Social History Social History  Substance Use Topics  . Smoking status: Current Every Day Smoker    Types: Cigarettes  . Smokeless tobacco: Never Used  . Alcohol use No     Allergies   Mucinex [guaifenesin er] and Nsaids   Review of Systems Review of Systems  Constitutional: Negative for chills and fever.  HENT: Negative for ear pain and sore throat.   Eyes: Negative for pain and visual disturbance.  Respiratory: Positive for apnea. Negative for cough and shortness of breath.   Cardiovascular: Negative for chest pain and palpitations.  Gastrointestinal: Positive for abdominal pain. Negative for bowel incontinence, diarrhea, nausea and vomiting.  Genitourinary: Negative for bladder incontinence, dysuria and hematuria.  Musculoskeletal: Positive for myalgias. Negative for arthralgias and back pain.  Skin: Negative for color change and rash.  Neurological: Positive for seizures. Negative for syncope, facial asymmetry, speech difficulty, numbness and headaches.  Psychiatric/Behavioral: Positive for confusion.    All other systems reviewed and are negative.    Physical Exam Updated Vital Signs BP 134/79   Pulse 88   Temp 98.2 F (36.8 C) (Oral)   Resp 17   Ht '5\' 8"'$  (1.727 m)   Wt 56.2 kg   SpO2 96%   BMI 18.85 kg/m   Physical Exam  Constitutional: She appears cachectic. She has a sickly appearance. No distress.  HENT:  Head: Normocephalic and atraumatic.  Mouth/Throat: Mucous membranes are dry.  Eyes: Conjunctivae and EOM are normal. Pupils are equal, round, and reactive to light.  Neck: Neck supple.  Cardiovascular: Normal rate and regular rhythm.   No murmur heard. Pulmonary/Chest: Effort normal and breath sounds normal. No respiratory distress.  Abdominal: Soft. There is generalized tenderness.  Musculoskeletal: She exhibits no edema.  Neurological: She is alert. She has normal strength. She is disoriented (Pt not oriented to day, month or year). No cranial nerve deficit or sensory deficit. Coordination normal. GCS eye subscore is 4.  GCS verbal subscore is 5. GCS motor subscore is 6.  Skin: Skin is warm and dry. No rash noted.  Psychiatric: She has a normal mood and affect. Her speech is normal and behavior is normal. Cognition and memory are impaired.  Nursing note and vitals reviewed.    ED Treatments / Results  Labs (all labs ordered are listed, but only abnormal results are displayed) Labs Reviewed  CBC - Abnormal; Notable for the following:       Result Value   RBC 3.77 (*)    Hemoglobin 11.7 (*)    HCT 35.8 (*)    All other components within normal limits  BASIC METABOLIC PANEL - Abnormal; Notable for the following:    Potassium 3.3 (*)    CO2 21 (*)    Glucose, Bld 103 (*)    BUN <5 (*)    Calcium 8.5 (*)    All other components within normal limits  URINALYSIS, ROUTINE W REFLEX MICROSCOPIC - Abnormal; Notable for the following:    APPearance CLOUDY (*)    Hgb urine dipstick LARGE (*)    Leukocytes, UA LARGE (*)    Bacteria, UA MANY (*)    Squamous  Epithelial / LPF 0-5 (*)    All other components within normal limits  MAGNESIUM - Abnormal; Notable for the following:    Magnesium 1.4 (*)    All other components within normal limits  RAPID URINE DRUG SCREEN, HOSP PERFORMED - Abnormal; Notable for the following:    Opiates POSITIVE (*)    All other components within normal limits  ACETAMINOPHEN LEVEL - Abnormal; Notable for the following:    Acetaminophen (Tylenol), Serum <10 (*)    All other components within normal limits  HEPATIC FUNCTION PANEL - Abnormal; Notable for the following:    Albumin 3.3 (*)    ALT 11 (*)    Bilirubin, Direct <0.1 (*)    All other components within normal limits  CBG MONITORING, ED - Abnormal; Notable for the following:    Glucose-Capillary 122 (*)    All other components within normal limits  I-STAT CHEM 8, ED - Abnormal; Notable for the following:    Potassium 3.3 (*)    BUN <3 (*)    Creatinine, Ser 0.40 (*)    Glucose, Bld 103 (*)    Calcium, Ion 1.06 (*)    All other components within normal limits  CULTURE, BLOOD (ROUTINE X 2)  CULTURE, BLOOD (ROUTINE X 2)  LIPASE, BLOOD  SALICYLATE LEVEL  ETHANOL  I-STAT CG4 LACTIC ACID, ED  I-STAT TROPOININ, ED  I-STAT CG4 LACTIC ACID, ED  Randolm Idol, ED    EKG  EKG Interpretation  Date/Time:  Monday June 19 2016 21:53:21 EDT Ventricular Rate:  84 PR Interval:    QRS Duration: 93 QT Interval:  389 QTC Calculation: 460 R Axis:   73 Text Interpretation:  Sinus rhythm RSR' in V1 or V2, probably normal variant No acute changes No significant change since last tracing Confirmed by Kathrynn Humble, MD, Thelma Comp [phone]) on 06/19/2016 10:11:50 PM       Radiology Ct Head Wo Contrast  Result Date: 06/20/2016 CLINICAL DATA:  Seizure and confusion EXAM: CT HEAD WITHOUT CONTRAST TECHNIQUE: Contiguous axial images were obtained from the base of the skull through the vertex without intravenous contrast. COMPARISON:  06/17/2009 FINDINGS: Brain: Abnormal  low-attenuation visualize within the right temporal lobe with additional foci of abnormal low-density in the right frontal and parietal white matter. Possible additional  low-attenuation focus in the left posterior parietal white matter. No midline shift. The ventricles are nonenlarged. No definitive hemorrhage. Vascular: No hyperdense vessels.  Carotid artery calcifications. Skull: No fracture or suspicious bone lesion. Sinuses/Orbits: No acute finding. Other: None IMPRESSION: 1. Multifocal areas of abnormal low-density/edema involving the right temporal lobe, right frontal and parietal lobes, and possibly the left parietal lobe. Findings could be secondary to infection or tumor. Contrast-enhanced MRI is recommended for further evaluation. Electronically Signed   By: Donavan Foil M.D.   On: 06/20/2016 00:24   Ct Angio Chest/abd/pel For Dissection W And/or Wo Contrast  Result Date: 06/20/2016 CLINICAL DATA:  Seizure tonight at 20:30.  Confusion. EXAM: CT ANGIOGRAPHY CHEST, ABDOMEN AND PELVIS TECHNIQUE: Multidetector CT imaging through the chest, abdomen and pelvis was performed using the standard protocol during bolus administration of intravenous contrast. Multiplanar reconstructed images and MIPs were obtained and reviewed to evaluate the vascular anatomy. CONTRAST:  100 mL Isovue 370 intravenous COMPARISON:  07/16/2008 FINDINGS: CTA CHEST FINDINGS Cardiovascular: Extensive soft and hard plaque throughout the thoracic aorta. No aneurysm or dissection. Pulmonary arteries are also well opacified, without embolism. Normal heart size. No pericardial effusion. Mediastinum/Nodes: No enlarged mediastinal or hilar nodes. Mild thyroid nodularity, not requiring additional workup. Lungs/Pleura: Centrilobular emphysematous disease, upper lobe predominant. Multiple irregular subcentimeter nodules are present, the largest measuring 8 mm in the left upper lobe on series 14, image 35. Central airways are patent and normal in  caliber. No endobronchial lesions are evident. No pleural effusions. Musculoskeletal: No significant skeletal lesion. Review of the MIP images confirms the above findings. CTA ABDOMEN AND PELVIS FINDINGS VASCULAR Aorta: Extensive soft and hard plaque. Ulcerated plaque at the posterior wall on the left, just below the renal artery origins. 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 No evidence of acute dissection. Celiac: Independent origin of the common hepatic artery from the aorta, a normal variant. No aneurysm or dissection. SMA: Patent without evidence of aneurysm, dissection, vasculitis or significant stenosis. Renals: Accessory right renal artery to the lower pole. Calcified origins of the renal arteries with mild stenosis on the left and moderate stenosis on the right. No acute dissection or occlusion. IMA: Not visible.  Probable chronic occlusion. Inflow: Heavily calcified bilaterally. Left common iliac artery origin stenosis. Iliac bifurcations are heavily calcified bilaterally but patent. No acute dissection or occlusion. Veins: No obvious venous abnormality within the limitations of this arterial phase study. Review of the MIP images confirms the above findings. NON-VASCULAR Hepatobiliary: No focal liver abnormality is seen. Status post cholecystectomy. No biliary dilatation. Pancreas: Unremarkable. No pancreatic ductal dilatation or surrounding inflammatory changes. Spleen: The spleen is enlarged and contains multiple low-attenuation masses. This is new from 07/16/2008. Cannot exclude lymphoma or other neoplastic process. Adrenals/Urinary Tract: Mildly thickened adrenals bilaterally without discrete mass. No significant renal parenchymal lesion. No urinary calculi. Ureters and urinary bladder are unremarkable. Stomach/Bowel: Stomach is within normal limits. Appendix is normal. No evidence of bowel wall thickening, distention, or inflammatory changes.  Lymphatic: Multiple prominent nodes, predominantly in the para-aortic and common iliac regions, measuring up to 1.2 cm short axis. Reproductive: Status post hysterectomy. No adnexal masses. Other: No focal inflammation.  No ascites. Musculoskeletal: No significant skeletal lesion. Review of the MIP images confirms the above findings. IMPRESSION: 1. Negative for acute aortic dissection or occlusion 2. Extensive soft and hard plaque throughout the thoracic and abdominal aorta. Ulcerated plaque in the abdominal aorta just below the level  of the renal arteries. 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 3. 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 Left common iliac origin stenosis. 4. Splenomegaly and multiple low-attenuation splenic masses, new from 07/16/2008. This could represent lymphoma or other neoplastic process. 5. Prominent para-aortic and common iliac nodes. Lymphoma or other neoplasm cannot be excluded. 6. Multiple subcentimeter pulmonary nodules bilaterally. At a minimum, follow-up CT in 3-6 months is recommended. More aggressive evaluation may be warranted, given the splenomegaly and abdominal adenopathy. Electronically Signed   By: Andreas Newport M.D.   On: 06/20/2016 00:53    Procedures Procedures (including critical care time)  Medications Ordered in ED Medications  magnesium sulfate IVPB 2 g 50 mL (2 g Intravenous New Bag/Given 06/20/16 0016)  potassium chloride SA (K-DUR,KLOR-CON) CR tablet 40 mEq (not administered)  cefTRIAXone (ROCEPHIN) injection 2 g (not administered)  levETIRAcetam (KEPPRA) 1,000 mg in sodium chloride 0.9 % 100 mL IVPB (not administered)  sodium chloride 0.9 % bolus 1,000 mL (0 mLs Intravenous Stopped 06/20/16 0009)  ondansetron (ZOFRAN) injection 4 mg (4 mg Intravenous Given 06/19/16 2223)  iopamidol (ISOVUE-370) 76 % injection (100 mLs  Contrast Given 06/19/16 2345)     Initial Impression / Assessment and Plan / ED Course  I have reviewed the triage  vital signs and the nursing notes.  Pertinent labs & imaging results that were available during my care of the patient were reviewed by me and considered in my medical decision making (see chart for details).     61 year old female with history of AAA without rupture him a chronic pain presents in the setting of seizure-like activity. Per family patient was complaining of some mild generalized not feeling well today and had one bout of emesis. He reports she was sitting in her recliner Husband when she suddenly had a generalized tonic-clonic seizure. Patient without history of seizure disorder. They report this lasted 5 minutes and resolved prior to arrival of EMS. On arrival of EMS patient appeared confused and vital signs within normal limits.  Family reports patient has been having some bleeding recently from pelvic sling. He reports this sling was evaluated by a physician recently who noted no abnormality however patient family reports she continues to complain of pain and has intermittent bleeding.  On arrival in emergency Department patient hemodynamically stable and afebrile. Patient with no focal neurologic deficit. Patient reports she was confused to day, month, year. Oriented to location, self, family. Denies recent alcohol or drug ingestion. Patient does take daily opioids.  Laboratory analysis revealed no electrolyte abnormalities, significant anemia. Patient did have mild hypomagnesemia and hypokalemia which was repleted. Patient with likely urinary tract infection and given Rocephin. Blood cultures obtained. No significant elevation in white blood cell count.  Imaging concerning for intracranial mass versus intracranial infection. Patient has no signs of sepsis including no hypertension, elevated white blood cell count, fever. Dissection study obtained with no signs of bleeding aneurysm however findings concerning for malignancy. Specifically lymphoma..  Patient loaded with Keppra.  At  this time as patient has had seizures secondary to intracranial lesion of unknown etiology patient will require inpatient hospitalization, MRI and likely evaluation by neurosurgery. Patient stable at time of admission to hospital.  Final Clinical Impressions(s) / ED Diagnoses   Final diagnoses:  Seizure ([place])  Hypomagnesemia  Hypokalemia  Intracranial mass    New Prescriptions New Prescriptions   No medications on file     Esaw Grandchild, MD 06/20/16 0140    Varney Biles, MD 06/25/16 [phone]

## 2016-06-20 ENCOUNTER — Inpatient Hospital Stay (HOSPITAL_COMMUNITY): Payer: Medicaid Other

## 2016-06-20 ENCOUNTER — Encounter (HOSPITAL_COMMUNITY): Payer: Self-pay | Admitting: Internal Medicine

## 2016-06-20 DIAGNOSIS — M810 Age-related osteoporosis without current pathological fracture: Secondary | ICD-10-CM | POA: Diagnosis present

## 2016-06-20 DIAGNOSIS — R51 Headache: Secondary | ICD-10-CM | POA: Diagnosis present

## 2016-06-20 DIAGNOSIS — G939 Disorder of brain, unspecified: Secondary | ICD-10-CM | POA: Diagnosis present

## 2016-06-20 DIAGNOSIS — C3490 Malignant neoplasm of unspecified part of unspecified bronchus or lung: Secondary | ICD-10-CM | POA: Diagnosis present

## 2016-06-20 DIAGNOSIS — Z807 Family history of other malignant neoplasms of lymphoid, hematopoietic and related tissues: Secondary | ICD-10-CM | POA: Diagnosis not present

## 2016-06-20 DIAGNOSIS — R161 Splenomegaly, not elsewhere classified: Secondary | ICD-10-CM | POA: Diagnosis not present

## 2016-06-20 DIAGNOSIS — Z90710 Acquired absence of both cervix and uterus: Secondary | ICD-10-CM | POA: Diagnosis not present

## 2016-06-20 DIAGNOSIS — G40409 Other generalized epilepsy and epileptic syndromes, not intractable, without status epilepticus: Secondary | ICD-10-CM | POA: Diagnosis present

## 2016-06-20 DIAGNOSIS — Z7951 Long term (current) use of inhaled steroids: Secondary | ICD-10-CM | POA: Diagnosis not present

## 2016-06-20 DIAGNOSIS — K219 Gastro-esophageal reflux disease without esophagitis: Secondary | ICD-10-CM | POA: Diagnosis present

## 2016-06-20 DIAGNOSIS — C7931 Secondary malignant neoplasm of brain: Secondary | ICD-10-CM | POA: Diagnosis present

## 2016-06-20 DIAGNOSIS — Z8542 Personal history of malignant neoplasm of other parts of uterus: Secondary | ICD-10-CM | POA: Diagnosis not present

## 2016-06-20 DIAGNOSIS — C775 Secondary and unspecified malignant neoplasm of intrapelvic lymph nodes: Secondary | ICD-10-CM | POA: Diagnosis present

## 2016-06-20 DIAGNOSIS — N390 Urinary tract infection, site not specified: Secondary | ICD-10-CM | POA: Diagnosis present

## 2016-06-20 DIAGNOSIS — Z7982 Long term (current) use of aspirin: Secondary | ICD-10-CM | POA: Diagnosis not present

## 2016-06-20 DIAGNOSIS — I10 Essential (primary) hypertension: Secondary | ICD-10-CM | POA: Diagnosis present

## 2016-06-20 DIAGNOSIS — R569 Unspecified convulsions: Secondary | ICD-10-CM

## 2016-06-20 DIAGNOSIS — Z833 Family history of diabetes mellitus: Secondary | ICD-10-CM | POA: Diagnosis not present

## 2016-06-20 DIAGNOSIS — D509 Iron deficiency anemia, unspecified: Secondary | ICD-10-CM | POA: Diagnosis present

## 2016-06-20 DIAGNOSIS — J449 Chronic obstructive pulmonary disease, unspecified: Secondary | ICD-10-CM | POA: Diagnosis present

## 2016-06-20 DIAGNOSIS — G936 Cerebral edema: Secondary | ICD-10-CM | POA: Diagnosis present

## 2016-06-20 DIAGNOSIS — E785 Hyperlipidemia, unspecified: Secondary | ICD-10-CM | POA: Diagnosis present

## 2016-06-20 DIAGNOSIS — Z720 Tobacco use: Secondary | ICD-10-CM | POA: Diagnosis not present

## 2016-06-20 DIAGNOSIS — R938 Abnormal findings on diagnostic imaging of other specified body structures: Secondary | ICD-10-CM | POA: Diagnosis not present

## 2016-06-20 DIAGNOSIS — K7460 Unspecified cirrhosis of liver: Secondary | ICD-10-CM | POA: Diagnosis present

## 2016-06-20 DIAGNOSIS — Z800 Family history of malignant neoplasm of digestive organs: Secondary | ICD-10-CM | POA: Diagnosis not present

## 2016-06-20 DIAGNOSIS — R740 Nonspecific elevation of levels of transaminase and lactic acid dehydrogenase [LDH]: Secondary | ICD-10-CM | POA: Diagnosis not present

## 2016-06-20 DIAGNOSIS — E876 Hypokalemia: Secondary | ICD-10-CM | POA: Diagnosis present

## 2016-06-20 DIAGNOSIS — B1920 Unspecified viral hepatitis C without hepatic coma: Secondary | ICD-10-CM | POA: Diagnosis not present

## 2016-06-20 DIAGNOSIS — Z79899 Other long term (current) drug therapy: Secondary | ICD-10-CM | POA: Diagnosis not present

## 2016-06-20 DIAGNOSIS — R599 Enlarged lymph nodes, unspecified: Secondary | ICD-10-CM | POA: Diagnosis not present

## 2016-06-20 DIAGNOSIS — F17210 Nicotine dependence, cigarettes, uncomplicated: Secondary | ICD-10-CM | POA: Diagnosis present

## 2016-06-20 LAB — COMPREHENSIVE METABOLIC PANEL
ALK PHOS: 80 U/L (ref 38–126)
ALT: 10 U/L — AB (ref 14–54)
AST: 21 U/L (ref 15–41)
Albumin: 2.8 g/dL — ABNORMAL LOW (ref 3.5–5.0)
Anion gap: 9 (ref 5–15)
BILIRUBIN TOTAL: 0.3 mg/dL (ref 0.3–1.2)
CALCIUM: 8 mg/dL — AB (ref 8.9–10.3)
CO2: 22 mmol/L (ref 22–32)
CREATININE: 0.46 mg/dL (ref 0.44–1.00)
Chloride: 107 mmol/L (ref 101–111)
GFR calc Af Amer: >60 mL/min (ref >60)
GLUCOSE: 90 mg/dL (ref 65–99)
Potassium: 3 mmol/L — ABNORMAL LOW (ref 3.5–5.1)
Sodium: 138 mmol/L (ref 135–145)
Total Protein: 6.2 g/dL — ABNORMAL LOW (ref 6.5–8.1)

## 2016-06-20 LAB — CBC WITH DIFFERENTIAL/PLATELET
BASOS ABS: 0 10*3/uL (ref 0.0–0.1)
Basophils Relative: 0 %
Eosinophils Absolute: 0.1 10*3/uL (ref 0.0–0.7)
Eosinophils Relative: 2 %
HEMATOCRIT: 34.1 % — AB (ref 36.0–46.0)
HEMOGLOBIN: 11.2 g/dL — AB (ref 12.0–15.0)
LYMPHS PCT: 33 %
Lymphs Abs: 1.8 10*3/uL (ref 0.7–4.0)
MCH: 31.1 pg (ref 26.0–34.0)
MCHC: 32.8 g/dL (ref 30.0–36.0)
MCV: 94.7 fL (ref 78.0–100.0)
MONO ABS: 0.5 10*3/uL (ref 0.1–1.0)
Monocytes Relative: 9 %
NEUTROS ABS: 3.1 10*3/uL (ref 1.7–7.7)
NEUTROS PCT: 56 %
Platelets: 196 10*3/uL (ref 150–400)
RBC: 3.6 MIL/uL — AB (ref 3.87–5.11)
RDW: 14.7 % (ref 11.5–15.5)
WBC: 5.6 10*3/uL (ref 4.0–10.5)

## 2016-06-20 LAB — PROTIME-INR
INR: 1.04
PROTHROMBIN TIME: 13.6 s (ref 11.4–15.2)

## 2016-06-20 LAB — MRSA PCR SCREENING: MRSA BY PCR: NEGATIVE

## 2016-06-20 LAB — I-STAT CG4 LACTIC ACID, ED: Lactic Acid, Venous: 0.91 mmol/L (ref 0.5–1.9)

## 2016-06-20 LAB — HIV ANTIBODY (ROUTINE TESTING W REFLEX): HIV SCREEN 4TH GENERATION: NONREACTIVE

## 2016-06-20 LAB — SEDIMENTATION RATE: Sed Rate: 49 mm/hr — ABNORMAL HIGH (ref 0–22)

## 2016-06-20 LAB — I-STAT TROPONIN, ED: TROPONIN I, POC: 0.05 ng/mL (ref 0.00–0.08)

## 2016-06-20 LAB — LACTATE DEHYDROGENASE: LDH: 268 U/L — ABNORMAL HIGH (ref 98–192)

## 2016-06-20 MED ORDER — DEXTROSE 5 % IV SOLN
1.0000 g | INTRAVENOUS | Status: AC
  Administered 2016-06-20 – 2016-06-22 (×3): 1 g via INTRAVENOUS
  Filled 2016-06-20 (×3): qty 10

## 2016-06-20 MED ORDER — ACETAMINOPHEN 325 MG PO TABS
650.0000 mg | ORAL_TABLET | Freq: Four times a day (QID) | ORAL | Status: DC | PRN
  Administered 2016-06-20 – 2016-06-22 (×3): 650 mg via ORAL
  Filled 2016-06-20 (×3): qty 2

## 2016-06-20 MED ORDER — LEVALBUTEROL HCL 0.63 MG/3ML IN NEBU: 0.6300 mg | INHALATION_SOLUTION | RESPIRATORY_TRACT | Status: DC | PRN

## 2016-06-20 MED ORDER — ACETAMINOPHEN 650 MG RE SUPP
650.0000 mg | Freq: Four times a day (QID) | RECTAL | Status: DC | PRN
Start: 2016-06-20 — End: 2016-06-22

## 2016-06-20 MED ORDER — POTASSIUM CHLORIDE IN NACL 40-0.9 MEQ/L-% IV SOLN
INTRAVENOUS | Status: AC
  Administered 2016-06-20 – 2016-06-21 (×2): 75 mL/h via INTRAVENOUS
  Filled 2016-06-20 (×2): qty 1000

## 2016-06-20 MED ORDER — POTASSIUM CHLORIDE CRYS ER 20 MEQ PO TBCR
40.0000 meq | EXTENDED_RELEASE_TABLET | Freq: Two times a day (BID) | ORAL | Status: DC
  Administered 2016-06-21 – 2016-06-23 (×6): 40 meq via ORAL
  Filled 2016-06-20 (×6): qty 2

## 2016-06-20 MED ORDER — SODIUM CHLORIDE 0.9 % IV SOLN
1000.0000 mg | Freq: Once | INTRAVENOUS | Status: AC
  Administered 2016-06-20: 1000 mg via INTRAVENOUS
  Filled 2016-06-20: qty 10

## 2016-06-20 MED ORDER — SODIUM CHLORIDE 0.9 % IV SOLN
500.0000 mg | Freq: Two times a day (BID) | INTRAVENOUS | Status: DC
  Administered 2016-06-20 – 2016-06-23 (×8): 500 mg via INTRAVENOUS
  Filled 2016-06-20 (×9): qty 5

## 2016-06-20 MED ORDER — FERROUS SULFATE 325 (65 FE) MG PO TABS
325.0000 mg | ORAL_TABLET | Freq: Two times a day (BID) | ORAL | Status: DC
  Administered 2016-06-20 – 2016-06-21 (×2): 325 mg via ORAL
  Filled 2016-06-20 (×2): qty 1

## 2016-06-20 MED ORDER — CEFTRIAXONE SODIUM 1 G IJ SOLR
2.0000 g | Freq: Once | INTRAMUSCULAR | Status: AC
Start: 2016-06-20 — End: 2016-06-20
  Administered 2016-06-20: 2 g via INTRAMUSCULAR
  Filled 2016-06-20: qty 20

## 2016-06-20 MED ORDER — ALPRAZOLAM 0.25 MG PO TABS
0.5000 mg | ORAL_TABLET | Freq: Once | ORAL | Status: AC
  Administered 2016-06-20: 0.5 mg via ORAL
  Filled 2016-06-20: qty 2

## 2016-06-20 MED ORDER — OXYCODONE HCL 5 MG PO TABS
10.0000 mg | ORAL_TABLET | Freq: Two times a day (BID) | ORAL | Status: DC | PRN
  Administered 2016-06-20 – 2016-06-22 (×4): 10 mg via ORAL
  Filled 2016-06-20 (×5): qty 2

## 2016-06-20 MED ORDER — TIOTROPIUM BROMIDE MONOHYDRATE 18 MCG IN CAPS
18.0000 ug | ORAL_CAPSULE | Freq: Every day | RESPIRATORY_TRACT | Status: DC
  Administered 2016-06-20 – 2016-06-24 (×5): 18 ug via RESPIRATORY_TRACT
  Filled 2016-06-20: qty 5

## 2016-06-20 MED ORDER — ALPRAZOLAM 0.5 MG PO TABS
0.5000 mg | ORAL_TABLET | Freq: Two times a day (BID) | ORAL | Status: DC | PRN
  Administered 2016-06-22 – 2016-06-24 (×5): 0.5 mg via ORAL
  Filled 2016-06-20 (×6): qty 1

## 2016-06-20 MED ORDER — LORAZEPAM 2 MG/ML IJ SOLN: 1.0000 mg | INTRAMUSCULAR | Status: DC | PRN

## 2016-06-20 MED ORDER — FENOFIBRATE 54 MG PO TABS
54.0000 mg | ORAL_TABLET | Freq: Every day | ORAL | Status: DC
  Administered 2016-06-21 – 2016-06-23 (×3): 54 mg via ORAL
  Filled 2016-06-20 (×5): qty 1

## 2016-06-20 MED ORDER — POTASSIUM CHLORIDE 2 MEQ/ML IV SOLN
30.0000 meq | Freq: Once | INTRAVENOUS | Status: AC
  Administered 2016-06-20: 30 meq via INTRAVENOUS
  Filled 2016-06-20: qty 15

## 2016-06-20 MED ORDER — POTASSIUM CHLORIDE CRYS ER 20 MEQ PO TBCR: 40.0000 meq | EXTENDED_RELEASE_TABLET | Freq: Two times a day (BID) | ORAL | Status: DC

## 2016-06-20 MED ORDER — LIDOCAINE HCL (PF) 1 % IJ SOLN
INTRAMUSCULAR | Status: AC
  Administered 2016-06-20: 5 mL
  Filled 2016-06-20: qty 5

## 2016-06-20 MED ORDER — SODIUM CHLORIDE 0.9 % IV SOLN
INTRAVENOUS | Status: DC
  Administered 2016-06-20: 07:00:00 via INTRAVENOUS

## 2016-06-20 MED ORDER — ATORVASTATIN CALCIUM 40 MG PO TABS
40.0000 mg | ORAL_TABLET | Freq: Every day | ORAL | Status: DC
  Administered 2016-06-20 – 2016-06-23 (×4): 40 mg via ORAL
  Filled 2016-06-20 (×4): qty 1

## 2016-06-20 NOTE — ED Notes (Signed)
Admitting at bedside 

## 2016-06-20 NOTE — ED Notes (Signed)
Dr. Hal Hope paged to [facility] RN @ [phone] @ [phone].

## 2016-06-20 NOTE — Care Management Note (Signed)
Case Management Note  Patient Details  Name: [name] MRN: [id number] Date of Birth: 1955-06-13  Subjective/Objective:  From home, presents with Seizures. Awaiting pt eval.                  Action/Plan: NCM will cont to follow for dc needs.   Expected Discharge Date:                  Expected Discharge Plan:  Home/Self Care  In-House Referral:     Discharge planning Services  CM Consult  Post Acute Care Choice:    Choice offered to:     DME Arranged:    DME Agency:     HH Arranged:    HH Agency:     Status of Service:  In process, will continue to follow  If discussed at Long Length of Stay Meetings, dates discussed:    Additional Comments:  Zenon Mayo, RN 06/20/2016, 5:04 PM

## 2016-06-20 NOTE — Consult Note (Addendum)
Reason for Consult:brain tumor Referring Physician: Evangelene Vora is an 61 y.o. female.  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 The dominant lesion is in the right temporal tip. Their is edema around the lesions most of which are in the occipital region and are sub centimeter. A CT of the chest abdomen and pelvis shows splenomegaly and multiple pulmonary nodules in addition to lymphadenopathy.  Past Medical History:  Diagnosis Date  . Age-related osteoporosis without current pathological fracture   . Anxiety   . Aortic aneurysm, abdominal (HCC) 9 cm  . Chronic obstructive pulmonary disease (HCC)   . Depression   . Exposure of implanted vaginal mesh and prosthetic material in vagina (HCC)   . GERD (gastroesophageal reflux disease)   . Hypertension   . Nicotine dependence   . Uterus cancer (HCC)   . Viral hepatitis C   . Vitamin D deficiency     Past Surgical History:  Procedure Laterality Date  . ABDOMINAL HYSTERECTOMY    . BLADDER SURGERY    . CHOLECYSTECTOMY    . ESOPHAGOGASTRODUODENOSCOPY N/A 02/25/2015   Procedure: ESOPHAGOGASTRODUODENOSCOPY (EGD);  Surgeon: Jeani Hawking, MD;  Location: [facility] ENDOSCOPY;  Service: Endoscopy;  Laterality: N/A;  . NEUROPLASTY / TRANSPOSITION MEDIAN NERVE AT CARPAL TUNNEL    . RECONSTRUCTION OF NOSE    . TONSILLECTOMY    . TUBAL LIGATION    . TUMOR REMOVAL     Nose    Family History  Problem Relation Age of Onset  . Diabetes Mother   . Breast cancer Mother   . Liver cancer Brother     Social History:  reports that she has been smoking Cigarettes.  She has never used smokeless tobacco. She reports that she does not drink alcohol or use drugs.  Allergies:  Allergies  Allergen Reactions  . Mucinex [Guaifenesin Er] Anaphylaxis  . Nsaids Anaphylaxis    Medications: I have reviewed the patient's current medications.  Results for orders placed or  performed during the hospital encounter of 06/19/16 (from the past 48 hour(s))  CBC - if new onset seizures     Status: Abnormal   Collection Time: 06/19/16  9:29 PM  Result Value Ref Range   WBC 5.9 4.0 - 10.5 K/uL   RBC 3.77 (L) 3.87 - 5.11 MIL/uL   Hemoglobin 11.7 (L) 12.0 - 15.0 g/dL   HCT 33.8 (L) 83.2 - 62.3 %   MCV 95.0 78.0 - 100.0 fL   MCH 31.0 26.0 - 34.0 pg   MCHC 32.7 30.0 - 36.0 g/dL   RDW 56.6 88.7 - 10.9 %   Platelets 217 150 - 400 K/uL  Basic metabolic panel - if new onset seizures     Status: Abnormal   Collection Time: 06/19/16  9:29 PM  Result Value Ref Range   Sodium 136 135 - 145 mmol/L   Potassium 3.3 (L) 3.5 - 5.1 mmol/L   Chloride 104 101 - 111 mmol/L   CO2 21 (L) 22 - 32 mmol/L   Glucose, Bld 103 (H) 65 - 99 mg/dL   BUN <5 (L) 6 - 20 mg/dL   Creatinine, Ser 7.80 0.44 - 1.00 mg/dL   Calcium 8.5 (L) 8.9 - 10.3 mg/dL   GFR calc non Af Amer >60 >60 mL/min   GFR calc Af Amer >60 >60 mL/min    Comment: (NOTE) The eGFR has been calculated using the CKD EPI  equation. This calculation has not been validated in all clinical situations. eGFR's persistently <60 mL/min signify possible Chronic Kidney Disease.    Anion gap 11 5 - 15  CBG monitoring, ED     Status: Abnormal   Collection Time: 06/19/16  9:52 PM  Result Value Ref Range   Glucose-Capillary 122 (H) 65 - 99 mg/dL  Lipase, blood     Status: None   Collection Time: 06/19/16 10:11 PM  Result Value Ref Range   Lipase 30 11 - 51 U/L  Magnesium     Status: Abnormal   Collection Time: 06/19/16 10:11 PM  Result Value Ref Range   Magnesium 1.4 (L) 1.7 - 2.4 mg/dL  Salicylate level     Status: None   Collection Time: 06/19/16 10:11 PM  Result Value Ref Range   Salicylate Lvl 9.8 2.8 - 30.0 mg/dL  Acetaminophen level     Status: Abnormal   Collection Time: 06/19/16 10:11 PM  Result Value Ref Range   Acetaminophen (Tylenol), Serum <10 (L) 10 - 30 ug/mL    Comment:        THERAPEUTIC CONCENTRATIONS  VARY SIGNIFICANTLY. A RANGE OF 10-30 ug/mL MAY BE AN EFFECTIVE CONCENTRATION FOR MANY PATIENTS. HOWEVER, SOME ARE BEST TREATED AT CONCENTRATIONS OUTSIDE THIS RANGE. ACETAMINOPHEN CONCENTRATIONS >150 ug/mL AT 4 HOURS AFTER INGESTION AND >50 ug/mL AT 12 HOURS AFTER INGESTION ARE OFTEN ASSOCIATED WITH TOXIC REACTIONS.   Ethanol     Status: None   Collection Time: 06/19/16 10:11 PM  Result Value Ref Range   Alcohol, Ethyl (B) <5 <5 mg/dL    Comment:        LOWEST DETECTABLE LIMIT FOR SERUM ALCOHOL IS 5 mg/dL FOR MEDICAL PURPOSES ONLY   Hepatic function panel     Status: Abnormal   Collection Time: 06/19/16 10:11 PM  Result Value Ref Range   Total Protein 7.1 6.5 - 8.1 g/dL   Albumin 3.3 (L) 3.5 - 5.0 g/dL   AST 27 15 - 41 U/L   ALT 11 (L) 14 - 54 U/L   Alkaline Phosphatase 89 38 - 126 U/L   Total Bilirubin 0.4 0.3 - 1.2 mg/dL   Bilirubin, Direct <0.1 (L) 0.1 - 0.5 mg/dL   Indirect Bilirubin NOT CALCULATED 0.3 - 0.9 mg/dL  I-Stat Troponin, ED - 0, 3, 6 hours (not at [facility])     Status: None   Collection Time: 06/19/16 10:32 PM  Result Value Ref Range   Troponin i, poc 0.00 0.00 - 0.08 ng/mL   Comment 3            Comment: Due to the release kinetics of cTnI, a negative result within the first hours of the onset of symptoms does not rule out myocardial infarction with certainty. If myocardial infarction is still suspected, repeat the test at appropriate intervals.   I-Stat Chem 8, ED     Status: Abnormal   Collection Time: 06/19/16 10:34 PM  Result Value Ref Range   Sodium 138 135 - 145 mmol/L   Potassium 3.3 (L) 3.5 - 5.1 mmol/L   Chloride 105 101 - 111 mmol/L   BUN <3 (L) 6 - 20 mg/dL   Creatinine, Ser 0.40 (L) 0.44 - 1.00 mg/dL   Glucose, Bld 103 (H) 65 - 99 mg/dL   Calcium, Ion 1.06 (L) 1.15 - 1.40 mmol/L   TCO2 24 0 - 100 mmol/L   Hemoglobin 12.9 12.0 - 15.0 g/dL   HCT 38.0 36.0 - 46.0 %  I-Stat CG4 Lactic Acid, ED     Status: None   Collection Time: 06/19/16  10:34 PM  Result Value Ref Range   Lactic Acid, Venous 1.39 0.5 - 1.9 mmol/L  Urinalysis, Routine w reflex microscopic     Status: Abnormal   Collection Time: 06/19/16 10:38 PM  Result Value Ref Range   Color, Urine YELLOW YELLOW   APPearance CLOUDY (A) CLEAR   Specific Gravity, Urine 1.010 1.005 - 1.030   pH 5.0 5.0 - 8.0   Glucose, UA NEGATIVE NEGATIVE mg/dL   Hgb urine dipstick LARGE (A) NEGATIVE   Bilirubin Urine NEGATIVE NEGATIVE   Ketones, ur NEGATIVE NEGATIVE mg/dL   Protein, ur NEGATIVE NEGATIVE mg/dL   Nitrite NEGATIVE NEGATIVE   Leukocytes, UA LARGE (A) NEGATIVE   RBC / HPF TOO NUMEROUS TO COUNT 0 - 5 RBC/hpf   WBC, UA TOO NUMEROUS TO COUNT 0 - 5 WBC/hpf   Bacteria, UA MANY (A) NONE SEEN   Squamous Epithelial / LPF 0-5 (A) NONE SEEN   WBC Clumps PRESENT    Mucous PRESENT   Rapid urine drug screen (hospital performed)     Status: Abnormal   Collection Time: 06/19/16 10:38 PM  Result Value Ref Range   Opiates POSITIVE (A) NONE DETECTED   Cocaine NONE DETECTED NONE DETECTED   Benzodiazepines NONE DETECTED NONE DETECTED   Amphetamines NONE DETECTED NONE DETECTED   Tetrahydrocannabinol NONE DETECTED NONE DETECTED   Barbiturates NONE DETECTED NONE DETECTED    Comment:        DRUG SCREEN FOR MEDICAL PURPOSES ONLY.  IF CONFIRMATION IS NEEDED FOR ANY PURPOSE, NOTIFY LAB WITHIN 5 DAYS.        LOWEST DETECTABLE LIMITS FOR URINE DRUG SCREEN Drug Class       Cutoff (ng/mL) Amphetamine      1000 Barbiturate      200 Benzodiazepine   694 Tricyclics       854 Opiates          300 Cocaine          300 THC              50   Sedimentation rate     Status: Abnormal   Collection Time: 06/20/16  2:14 AM  Result Value Ref Range   Sed Rate 49 (H) 0 - 22 mm/hr  Lactate dehydrogenase     Status: Abnormal   Collection Time: 06/20/16  2:14 AM  Result Value Ref Range   LDH 268 (H) 98 - 192 U/L  I-Stat Troponin, ED - 0, 3, 6 hours (not at [facility])     Status: None   Collection Time:  06/20/16  2:29 AM  Result Value Ref Range   Troponin i, poc 0.05 0.00 - 0.08 ng/mL   Comment 3            Comment: Due to the release kinetics of cTnI, a negative result within the first hours of the onset of symptoms does not rule out myocardial infarction with certainty. If myocardial infarction is still suspected, repeat the test at appropriate intervals.   I-Stat CG4 Lactic Acid, ED     Status: None   Collection Time: 06/20/16  2:32 AM  Result Value Ref Range   Lactic Acid, Venous 0.91 0.5 - 1.9 mmol/L  CBC WITH DIFFERENTIAL     Status: Abnormal   Collection Time: 06/20/16  5:24 AM  Result Value Ref Range   WBC 5.6 4.0 - 10.5 K/uL  RBC 3.60 (L) 3.87 - 5.11 MIL/uL   Hemoglobin 11.2 (L) 12.0 - 15.0 g/dL   HCT 11.8 (L) 91.9 - 35.6 %   MCV 94.7 78.0 - 100.0 fL   MCH 31.1 26.0 - 34.0 pg   MCHC 32.8 30.0 - 36.0 g/dL   RDW 09.0 12.7 - 98.0 %   Platelets 196 150 - 400 K/uL   Neutrophils Relative % 56 %   Neutro Abs 3.1 1.7 - 7.7 K/uL   Lymphocytes Relative 33 %   Lymphs Abs 1.8 0.7 - 4.0 K/uL   Monocytes Relative 9 %   Monocytes Absolute 0.5 0.1 - 1.0 K/uL   Eosinophils Relative 2 %   Eosinophils Absolute 0.1 0.0 - 0.7 K/uL   Basophils Relative 0 %   Basophils Absolute 0.0 0.0 - 0.1 K/uL  Comprehensive metabolic panel     Status: Abnormal   Collection Time: 06/20/16  5:24 AM  Result Value Ref Range   Sodium 138 135 - 145 mmol/L   Potassium 3.0 (L) 3.5 - 5.1 mmol/L   Chloride 107 101 - 111 mmol/L   CO2 22 22 - 32 mmol/L   Glucose, Bld 90 65 - 99 mg/dL   BUN <5 (L) 6 - 20 mg/dL   Creatinine, Ser 0.77 0.44 - 1.00 mg/dL   Calcium 8.0 (L) 8.9 - 10.3 mg/dL   Total Protein 6.2 (L) 6.5 - 8.1 g/dL   Albumin 2.8 (L) 3.5 - 5.0 g/dL   AST 21 15 - 41 U/L   ALT 10 (L) 14 - 54 U/L   Alkaline Phosphatase 80 38 - 126 U/L   Total Bilirubin 0.3 0.3 - 1.2 mg/dL   GFR calc non Af Amer >60 >60 mL/min   GFR calc Af Amer >60 >60 mL/min    Comment: (NOTE) The eGFR has been calculated  using the CKD EPI equation. This calculation has not been validated in all clinical situations. eGFR's persistently <60 mL/min signify possible Chronic Kidney Disease.    Anion gap 9 5 - 15    Ct Head Wo Contrast  Result Date: 06/20/2016 CLINICAL DATA:  Seizure and confusion EXAM: CT HEAD WITHOUT CONTRAST TECHNIQUE: Contiguous axial images were obtained from the base of the skull through the vertex without intravenous contrast. COMPARISON:  06/17/2009 FINDINGS: Brain: Abnormal low-attenuation visualize within the right temporal lobe with additional foci of abnormal low-density in the right frontal and parietal white matter. Possible additional low-attenuation focus in the left posterior parietal white matter. No midline shift. The ventricles are nonenlarged. No definitive hemorrhage. Vascular: No hyperdense vessels.  Carotid artery calcifications. Skull: No fracture or suspicious bone lesion. Sinuses/Orbits: No acute finding. Other: None IMPRESSION: 1. Multifocal areas of abnormal low-density/edema involving the right temporal lobe, right frontal and parietal lobes, and possibly the left parietal lobe. Findings could be secondary to infection or tumor. Contrast-enhanced MRI is recommended for further evaluation. Electronically Signed   By: Jasmine Pang M.D.   On: 06/20/2016 00:24   Mr Brain Wo Contrast  Result Date: 06/20/2016 CLINICAL DATA:  Initial evaluation for brain mass. EXAM: MRI HEAD WITHOUT CONTRAST TECHNIQUE: Multiplanar, multiecho pulse sequences of the brain and surrounding structures were obtained without intravenous contrast. COMPARISON:  Prior CT from 06/19/2016. FINDINGS: Brain: Examination is limited as the patient was unable to complete the exam. Cerebral volume within normal limits. No significant cerebral white matter disease. 2 cm mass centered at the anteromedial right temporal lobe present (series 5, image 10). Associated vasogenic edema within  the adjacent right temporal lobe.  Additional mass within the high posterior right frontal lobe measures approximately 19 mm (series 5, image 22). Localized vasogenic edema without significant mass effect. Probable additional small lesions slightly anteriorly (series 5, image 22). Additional vasogenic edema within the bilateral parietal regions also concerning for mass lesions (series 5, image 17, 16, 15). Vasogenic edema at the anterior left insular cortex also suspicious for small underlying lesion (series 6, image 13). Probable additional lesions within the parasagittal anterior left frontal lobe (series 6, image 20) as well as the high posterior left frontal lobe (series 6, image 21, 22). No other definite lesions on this limited noncontrast examination. Findings highly concerning for metastatic disease. No abnormal foci of restricted diffusion to suggest acute or subacute ischemia. Gray-white matter differentiation otherwise maintained. Few scattered foci susceptibility artifact noted within the supratentorial brain, suspected related to metastatic disease, although chronic hemorrhages could also be considered. No evidence for acute intracranial hemorrhage on this limited exam. No extra-axial fluid collection. Ventricles normal size without hydrocephalus. No midline shift. Basilar cisterns are patent. Incidental note made of an empty sella. Suprasellar region normal. Midline structures intact and normal. Major intracranial vascular flow voids are maintained. Skull and upper cervical spine: Craniocervical junction within normal limits. Visualized upper cervical spine unremarkable. Bone marrow signal intensity normal. No scalp soft tissue abnormality. Sinuses/Orbits: Globes and orbital soft tissues within normal limits. Paranasal sinuses are clear. S-shaped bowing of the nasal septum with bilateral concha bullosa noted. No mastoid effusion. Inner ear structures normal. Other: None. IMPRESSION: 1. Limited study due to patient's inability to  tolerate the full length of the exam. 2. Multifocal intracranial mass lesions as above, highly concerning for metastatic disease. A repeat study when the patient is able to tolerate the full length of the exam is recommended. Electronically Signed   By: Jeannine Boga M.D.   On: 06/20/2016 05:16   Ct Angio Chest/abd/pel For Dissection W And/or Wo Contrast  Result Date: 06/20/2016 CLINICAL DATA:  Seizure tonight at 20:30.  Confusion. EXAM: CT ANGIOGRAPHY CHEST, ABDOMEN AND PELVIS TECHNIQUE: Multidetector CT imaging through the chest, abdomen and pelvis was performed using the standard protocol during bolus administration of intravenous contrast. Multiplanar reconstructed images and MIPs were obtained and reviewed to evaluate the vascular anatomy. CONTRAST:  100 mL Isovue 370 intravenous COMPARISON:  07/16/2008 FINDINGS: CTA CHEST FINDINGS Cardiovascular: Extensive soft and hard plaque throughout the thoracic aorta. No aneurysm or dissection. Pulmonary arteries are also well opacified, without embolism. Normal heart size. No pericardial effusion. Mediastinum/Nodes: No enlarged mediastinal or hilar nodes. Mild thyroid nodularity, not requiring additional workup. Lungs/Pleura: Centrilobular emphysematous disease, upper lobe predominant. Multiple irregular subcentimeter nodules are present, the largest measuring 8 mm in the left upper lobe on series 14, image 35. Central airways are patent and normal in caliber. No endobronchial lesions are evident. No pleural effusions. Musculoskeletal: No significant skeletal lesion. Review of the MIP images confirms the above findings. CTA ABDOMEN AND PELVIS FINDINGS VASCULAR Aorta: Extensive soft and hard plaque. Ulcerated plaque at the posterior wall on the left, just below the renal artery origins. 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 No evidence of acute dissection. Celiac: Independent origin of the common hepatic  artery from the aorta, a normal variant. No aneurysm or dissection. SMA: Patent without evidence of aneurysm, dissection, vasculitis or significant stenosis. Renals: Accessory right renal artery to the lower pole. Calcified origins of the renal arteries with mild  stenosis on the left and moderate stenosis on the right. No acute dissection or occlusion. IMA: Not visible.  Probable chronic occlusion. Inflow: Heavily calcified bilaterally. Left common iliac artery origin stenosis. Iliac bifurcations are heavily calcified bilaterally but patent. No acute dissection or occlusion. Veins: No obvious venous abnormality within the limitations of this arterial phase study. Review of the MIP images confirms the above findings. NON-VASCULAR Hepatobiliary: No focal liver abnormality is seen. Status post cholecystectomy. No biliary dilatation. Pancreas: Unremarkable. No pancreatic ductal dilatation or surrounding inflammatory changes. Spleen: The spleen is enlarged and contains multiple low-attenuation masses. This is new from 07/16/2008. Cannot exclude lymphoma or other neoplastic process. Adrenals/Urinary Tract: Mildly thickened adrenals bilaterally without discrete mass. No significant renal parenchymal lesion. No urinary calculi. Ureters and urinary bladder are unremarkable. Stomach/Bowel: Stomach is within normal limits. Appendix is normal. No evidence of bowel wall thickening, distention, or inflammatory changes. Lymphatic: Multiple prominent nodes, predominantly in the para-aortic and common iliac regions, measuring up to 1.2 cm short axis. Reproductive: Status post hysterectomy. No adnexal masses. Other: No focal inflammation.  No ascites. Musculoskeletal: No significant skeletal lesion. Review of the MIP images confirms the above findings. IMPRESSION: 1. Negative for acute aortic dissection or occlusion 2. Extensive soft and hard plaque throughout the thoracic and abdominal aorta. Ulcerated plaque in the abdominal aorta  just below the level of the renal arteries. 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 3. 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 Left common iliac origin stenosis. 4. Splenomegaly and multiple low-attenuation splenic masses, new from 07/16/2008. This could represent lymphoma or other neoplastic process. 5. Prominent para-aortic and common iliac nodes. Lymphoma or other neoplasm cannot be excluded. 6. Multiple subcentimeter pulmonary nodules bilaterally. At a minimum, follow-up CT in 3-6 months is recommended. More aggressive evaluation may be warranted, given the splenomegaly and abdominal adenopathy. Electronically Signed   By: Andreas Newport M.D.   On: 06/20/2016 00:53    Review of Systems  Constitutional: Negative.   HENT: Negative.   Respiratory: Negative.   Cardiovascular: Negative.   Gastrointestinal: Negative.   Genitourinary: Negative.   Musculoskeletal: Negative.   Skin: Negative.   Neurological: Negative.   Endo/Heme/Allergies: Negative.   Psychiatric/Behavioral: Negative.    Blood pressure (!) 94/51, pulse 71, temperature 98.2 F (36.8 C), temperature source Oral, resp. rate 14, height '5\' 8"'$  (1.727 m), weight 57.3 kg (126 lb 5.2 oz), SpO2 98 %. Physical Exam  Constitutional: She appears well-developed and well-nourished.  HENT:  Head: Normocephalic and atraumatic.  Eyes: Conjunctivae and EOM are normal. Pupils are equal, round, and reactive to light.  Neck: Normal range of motion.  Musculoskeletal: Normal range of motion.  Neurological: She is alert. She has normal reflexes.  Skin: Skin is warm and dry.  Psychiatric: She has a normal mood and affect. Her behavior is normal. Judgment and thought content normal.    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 If primary can be acertained from one of these periferal  sited then patient may be candidate for radiosurgery, otherwise may need to consider excisional biopsy of temporal lobe lesion. We will follow.  Kariya Lavergne J 06/20/2016, 6:24 AM

## 2016-06-20 NOTE — Consult Note (Signed)
Chief Complaint: Patient was seen in consultation today for lymphadenopathy  Referring Physician(s):  Dr. Hal Hope  Supervising Physician: Corrie Mckusick  Patient Status: [facility] - In-pt  History of Present Illness: [name] is a 61 y.o. female with past medical history of anxiety, COPD, depression, GERD, and HTN presented to [facility] with acute onset seizure and recent headaches.   CT Head 4/30 shows: 1. Multifocal areas of abnormal low-density/edema involving the right temporal lobe, right frontal and parietal lobes, and possibly the left parietal lobe. Findings could be secondary to infection or tumor. Contrast-enhanced MRI is recommended for further evaluation.  CT Angio Chest/Abd/Pelvis 4/30: 1. Negative for acute aortic dissection or occlusion 2. Extensive soft and hard plaque throughout the thoracic and abdominal aorta. Ulcerated plaque in the abdominal aorta just below the level of the renal arteries. 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 3. 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 Left common iliac origin stenosis. 4. Splenomegaly and multiple low-attenuation splenic masses, new from 07/16/2008. This could represent lymphoma or other neoplastic process. 5. Prominent para-aortic and common iliac nodes. Lymphoma or other neoplasm cannot be excluded. 6. Multiple subcentimeter pulmonary nodules bilaterally. At a minimum, follow-up CT in 3-6 months is recommended. More aggressive evaluation may be warranted, given the splenomegaly and abdominal adenopathy.  IR consulted for possible lymph node biopsy.  Case reviewed by Dr. Earleen Newport who approves patient for procedure.   Patient is currently NPO and is not on blood thinners.   Past Medical History:  Diagnosis Date  . Age-related osteoporosis without current pathological fracture   . Anxiety   . Aortic aneurysm, abdominal (HCC) 9 cm  . Chronic obstructive pulmonary disease ([place])   . Depression   . Exposure  of implanted vaginal mesh and prosthetic material in vagina ([place])   . GERD (gastroesophageal reflux disease)   . Hypertension   . Nicotine dependence   . Uterus cancer ([place])   . Viral hepatitis C   . Vitamin D deficiency     Past Surgical History:  Procedure Laterality Date  . ABDOMINAL HYSTERECTOMY    . BLADDER SURGERY    . CHOLECYSTECTOMY    . ESOPHAGOGASTRODUODENOSCOPY N/A 02/25/2015   Procedure: ESOPHAGOGASTRODUODENOSCOPY (EGD);  Surgeon: Carol Ada, MD;  Location: [facility] ENDOSCOPY;  Service: Endoscopy;  Laterality: N/A;  . NEUROPLASTY / TRANSPOSITION MEDIAN NERVE AT CARPAL TUNNEL    . RECONSTRUCTION OF NOSE    . TONSILLECTOMY    . TUBAL LIGATION    . TUMOR REMOVAL     Nose    Allergies: Mucinex [guaifenesin er] and Nsaids  Medications: Prior to Admission medications   Medication Sig Start Date End Date Taking? Authorizing Provider  ALPRAZolam Duanne Moron) 0.5 MG tablet Take 0.5 mg by mouth 2 (two) times daily as needed for anxiety.    Yes Historical Provider, MD  aspirin EC 81 MG tablet Take 81 mg by mouth every 6 (six) hours as needed for mild pain.   Yes Historical Provider, MD  atorvastatin (LIPITOR) 40 MG tablet Take 1 tablet (40 mg total) by mouth daily at 6 PM. 02/26/15  Yes Hosie Poisson, MD  fenofibrate (TRICOR) 145 MG tablet Take 145 mg by mouth daily.   Yes Historical Provider, MD  ferrous sulfate 325 (65 FE) MG tablet Take 1 tablet (325 mg total) by mouth 2 (two) times daily with a meal. 02/26/15  Yes Hosie Poisson, MD  levalbuterol (XOPENEX) 0.63 MG/3ML nebulizer solution Take 3 mLs (0.63 mg total) by nebulization every  2 (two) hours as needed for wheezing. 02/26/15  Yes Hosie Poisson, MD  lidocaine (XYLOCAINE) 2 % jelly Apply 1 application topically 3 (three) times daily. Patient taking differently: Apply 1 application topically 3 (three) times daily as needed (pain).  02/26/15  Yes Hosie Poisson, MD  Oxycodone HCl 10 MG TABS Take 1 tablet (10 mg total) by mouth 2 (two) times daily  as needed. Patient taking differently: Take 10 mg by mouth 2 (two) times daily as needed (pain).  02/26/15  Yes Hosie Poisson, MD  potassium chloride SA (K-DUR,KLOR-CON) 20 MEQ tablet Take 2 tablets (40 mEq total) by mouth 2 (two) times daily. 02/26/15  Yes Hosie Poisson, MD  tiotropium (SPIRIVA) 18 MCG inhalation capsule Place 1 capsule (18 mcg total) into inhaler and inhale daily. 02/26/15  Yes Hosie Poisson, MD     Family History  Problem Relation Age of Onset  . Diabetes Mother   . Breast cancer Mother   . Liver cancer Brother     Social History   Social History  . Marital status: Divorced    Spouse name: N/A  . Number of children: N/A  . Years of education: N/A   Social History Main Topics  . Smoking status: Current Every Day Smoker    Types: Cigarettes  . Smokeless tobacco: Never Used  . Alcohol use No  . Drug use: No  . Sexual activity: Not Asked   Other Topics Concern  . None   Social History Narrative  . None    Review of Systems  Constitutional: Negative for fatigue and fever.  Respiratory: Positive for cough. Negative for shortness of breath.   Cardiovascular: Negative for chest pain.  Gastrointestinal: Negative for abdominal pain.  Neurological: Positive for headaches.  Psychiatric/Behavioral: Negative for behavioral problems and confusion.    Vital Signs: BP 119/71 (BP Location: Left Arm)   Pulse 89   Temp 98.4 F (36.9 C) (Oral)   Resp (!) 32   Ht '5\' 8"'$  (1.727 m)   Wt 126 lb 5.2 oz (57.3 kg)   SpO2 98%   BMI 19.21 kg/m   Physical Exam  Constitutional: She is oriented to person, place, and time. She appears well-developed.  Cardiovascular: Normal rate, regular rhythm and normal heart sounds.   Pulmonary/Chest: Effort normal. No respiratory distress.  Neurological: She is alert and oriented to person, place, and time.  Skin: Skin is warm and dry.  Psychiatric: She has a normal mood and affect. Her behavior is normal.  Nursing note and vitals  reviewed.   Mallampati Score:  MD Evaluation Airway: WNL Heart: WNL Abdomen: WNL Chest/ Lungs: WNL ASA  Classification: 3 Mallampati/Airway Score: Two  Imaging: Ct Head Wo Contrast  Result Date: 06/20/2016 CLINICAL DATA:  Seizure and confusion EXAM: CT HEAD WITHOUT CONTRAST TECHNIQUE: Contiguous axial images were obtained from the base of the skull through the vertex without intravenous contrast. COMPARISON:  06/17/2009 FINDINGS: Brain: Abnormal low-attenuation visualize within the right temporal lobe with additional foci of abnormal low-density in the right frontal and parietal white matter. Possible additional low-attenuation focus in the left posterior parietal white matter. No midline shift. The ventricles are nonenlarged. No definitive hemorrhage. Vascular: No hyperdense vessels.  Carotid artery calcifications. Skull: No fracture or suspicious bone lesion. Sinuses/Orbits: No acute finding. Other: None IMPRESSION: 1. Multifocal areas of abnormal low-density/edema involving the right temporal lobe, right frontal and parietal lobes, and possibly the left parietal lobe. Findings could be secondary to infection or tumor. Contrast-enhanced MRI is  recommended for further evaluation. Electronically Signed   By: Donavan Foil M.D.   On: 06/20/2016 00:24   Mr Brain Wo Contrast  Result Date: 06/20/2016 CLINICAL DATA:  Initial evaluation for brain mass. EXAM: MRI HEAD WITHOUT CONTRAST TECHNIQUE: Multiplanar, multiecho pulse sequences of the brain and surrounding structures were obtained without intravenous contrast. COMPARISON:  Prior CT from 06/19/2016. FINDINGS: Brain: Examination is limited as the patient was unable to complete the exam. Cerebral volume within normal limits. No significant cerebral white matter disease. 2 cm mass centered at the anteromedial right temporal lobe present (series 5, image 10). Associated vasogenic edema within the adjacent right temporal lobe. Additional mass within the  high posterior right frontal lobe measures approximately 19 mm (series 5, image 22). Localized vasogenic edema without significant mass effect. Probable additional small lesions slightly anteriorly (series 5, image 22). Additional vasogenic edema within the bilateral parietal regions also concerning for mass lesions (series 5, image 17, 16, 15). Vasogenic edema at the anterior left insular cortex also suspicious for small underlying lesion (series 6, image 13). Probable additional lesions within the parasagittal anterior left frontal lobe (series 6, image 20) as well as the high posterior left frontal lobe (series 6, image 21, 22). No other definite lesions on this limited noncontrast examination. Findings highly concerning for metastatic disease. No abnormal foci of restricted diffusion to suggest acute or subacute ischemia. Gray-white matter differentiation otherwise maintained. Few scattered foci susceptibility artifact noted within the supratentorial brain, suspected related to metastatic disease, although chronic hemorrhages could also be considered. No evidence for acute intracranial hemorrhage on this limited exam. No extra-axial fluid collection. Ventricles normal size without hydrocephalus. No midline shift. Basilar cisterns are patent. Incidental note made of an empty sella. Suprasellar region normal. Midline structures intact and normal. Major intracranial vascular flow voids are maintained. Skull and upper cervical spine: Craniocervical junction within normal limits. Visualized upper cervical spine unremarkable. Bone marrow signal intensity normal. No scalp soft tissue abnormality. Sinuses/Orbits: Globes and orbital soft tissues within normal limits. Paranasal sinuses are clear. S-shaped bowing of the nasal septum with bilateral concha bullosa noted. No mastoid effusion. Inner ear structures normal. Other: None. IMPRESSION: 1. Limited study due to patient's inability to tolerate the full length of the  exam. 2. Multifocal intracranial mass lesions as above, highly concerning for metastatic disease. A repeat study when the patient is able to tolerate the full length of the exam is recommended. Electronically Signed   By: Jeannine Boga M.D.   On: 06/20/2016 05:16   Ct Angio Chest/abd/pel For Dissection W And/or Wo Contrast  Result Date: 06/20/2016 CLINICAL DATA:  Seizure tonight at 20:30.  Confusion. EXAM: CT ANGIOGRAPHY CHEST, ABDOMEN AND PELVIS TECHNIQUE: Multidetector CT imaging through the chest, abdomen and pelvis was performed using the standard protocol during bolus administration of intravenous contrast. Multiplanar reconstructed images and MIPs were obtained and reviewed to evaluate the vascular anatomy. CONTRAST:  100 mL Isovue 370 intravenous COMPARISON:  07/16/2008 FINDINGS: CTA CHEST FINDINGS Cardiovascular: Extensive soft and hard plaque throughout the thoracic aorta. No aneurysm or dissection. Pulmonary arteries are also well opacified, without embolism. Normal heart size. No pericardial effusion. Mediastinum/Nodes: No enlarged mediastinal or hilar nodes. Mild thyroid nodularity, not requiring additional workup. Lungs/Pleura: Centrilobular emphysematous disease, upper lobe predominant. Multiple irregular subcentimeter nodules are present, the largest measuring 8 mm in the left upper lobe on series 14, image 35. Central airways are patent and normal in caliber. No endobronchial lesions are evident. No pleural effusions.  Musculoskeletal: No significant skeletal lesion. Review of the MIP images confirms the above findings. CTA ABDOMEN AND PELVIS FINDINGS VASCULAR Aorta: Extensive soft and hard plaque. Ulcerated plaque at the posterior wall on the left, just below the renal artery origins. 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 No evidence of acute dissection. Celiac: Independent origin of the common hepatic artery from the aorta, a normal  variant. No aneurysm or dissection. SMA: Patent without evidence of aneurysm, dissection, vasculitis or significant stenosis. Renals: Accessory right renal artery to the lower pole. Calcified origins of the renal arteries with mild stenosis on the left and moderate stenosis on the right. No acute dissection or occlusion. IMA: Not visible.  Probable chronic occlusion. Inflow: Heavily calcified bilaterally. Left common iliac artery origin stenosis. Iliac bifurcations are heavily calcified bilaterally but patent. No acute dissection or occlusion. Veins: No obvious venous abnormality within the limitations of this arterial phase study. Review of the MIP images confirms the above findings. NON-VASCULAR Hepatobiliary: No focal liver abnormality is seen. Status post cholecystectomy. No biliary dilatation. Pancreas: Unremarkable. No pancreatic ductal dilatation or surrounding inflammatory changes. Spleen: The spleen is enlarged and contains multiple low-attenuation masses. This is new from 07/16/2008. Cannot exclude lymphoma or other neoplastic process. Adrenals/Urinary Tract: Mildly thickened adrenals bilaterally without discrete mass. No significant renal parenchymal lesion. No urinary calculi. Ureters and urinary bladder are unremarkable. Stomach/Bowel: Stomach is within normal limits. Appendix is normal. No evidence of bowel wall thickening, distention, or inflammatory changes. Lymphatic: Multiple prominent nodes, predominantly in the para-aortic and common iliac regions, measuring up to 1.2 cm short axis. Reproductive: Status post hysterectomy. No adnexal masses. Other: No focal inflammation.  No ascites. Musculoskeletal: No significant skeletal lesion. Review of the MIP images confirms the above findings. IMPRESSION: 1. Negative for acute aortic dissection or occlusion 2. Extensive soft and hard plaque throughout the thoracic and abdominal aorta. Ulcerated plaque in the abdominal aorta just below the level of the  renal arteries. 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 3. 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 Left common iliac origin stenosis. 4. Splenomegaly and multiple low-attenuation splenic masses, new from 07/16/2008. This could represent lymphoma or other neoplastic process. 5. Prominent para-aortic and common iliac nodes. Lymphoma or other neoplasm cannot be excluded. 6. Multiple subcentimeter pulmonary nodules bilaterally. At a minimum, follow-up CT in 3-6 months is recommended. More aggressive evaluation may be warranted, given the splenomegaly and abdominal adenopathy. Electronically Signed   By: Andreas Newport M.D.   On: 06/20/2016 00:53    Labs:  CBC:  Recent Labs  06/19/16 2129 06/19/16 2234 06/20/16 0524  WBC 5.9  --  5.6  HGB 11.7* 12.9 11.2*  HCT 35.8* 38.0 34.1*  PLT 217  --  196    COAGS: No results for input(s): INR, APTT in the last 8760 hours.  BMP:  Recent Labs  06/19/16 2129 06/19/16 2234 06/20/16 0524  NA 136 138 138  K 3.3* 3.3* 3.0*  CL 104 105 107  CO2 21*  --  22  GLUCOSE 103* 103* 90  BUN <5* <3* <5*  CALCIUM 8.5*  --  8.0*  CREATININE 0.51 0.40* 0.46  GFRNONAA >60  --  >60  GFRAA >60  --  >60    LIVER FUNCTION TESTS:  Recent Labs  06/19/16 2211 06/20/16 0524  BILITOT 0.4 0.3  AST 27 21  ALT 11* 10*  ALKPHOS 89 80  PROT 7.1 6.2*  ALBUMIN 3.3* 2.8*  TUMOR MARKERS: No results for input(s): AFPTM, CEA, CA199, CHROMGRNA in the last 8760 hours.  Assessment and Plan: Lymphadenopathy Patient admitted with seizures found to have brain lesions, pulmonary lesions, and lymphadenopathy.  IR consulted for lymph node biopsy at the request of Dr. Hal Hope.  Case reviewed by Dr. Earleen Newport who feels patient is appropriate for procedure; likely CT-guided right iliac lymph node biopsy. Anticipate procedure tomorrow.   She will be placed NPO after midnight.  Continue to hold blood thinners.  Risks and Benefits discussed with the  patient including, but not limited to bleeding, infection, damage to adjacent structures or low yield requiring additional tests. All of the patient's questions were answered, patient is agreeable to proceed. Consent signed and in chart.   Thank you for this interesting consult.  I greatly enjoyed meeting BIANKA LIBERATI and look forward to participating in their care.  A copy of this report was sent to the requesting provider on this date.  Electronically Signed: Docia Barrier 06/20/2016, 11:35 AM   I spent a total of 40 Minutes    in face to face in clinical consultation, greater than 50% of which was counseling/coordinating care for lymphadenopathy

## 2016-06-20 NOTE — Progress Notes (Signed)
Patient c/o of back pain- tylenol 650 mg given p.o- pt's son at bedside, updated on pt's condition.

## 2016-06-20 NOTE — H&P (Signed)
History and Physical    [name] GMW:[id number] DOB: [birth year] DOA: 06/19/2016  PCP: Lorelee Market, MD  Patient coming from: Home.  Chief Complaint: Seizures.  HPI: [name] is a 61 y.o. female with history of COPD, hyperlipidemia, hepatitis C with cirrhosis and abdominal aortic aneurysm was brought to the ER the patient's family witnessed generalized tonic-clonic seizure. Patient was confused after the incident. Patient had been complaining of headache for last few months. Also had some weight loss. Denies any fever or chills. Patient states she has had a bladder mesh placed which at this time as being displaced per vagina weekly.  ED Course: CT of the head done in the ER shows multiple brain lesions. Since patient also had history of abdominal aortic aneurysm CT angiogram of the chest abdomen and pelvis was done which shows multiple subcentimeter lung nodules with splenic mass and para-aortic lymphadenopathy concerning for possible lymphoma. Patient was started on Keppra for seizures. On exam patient is presently alert awake oriented and appears nonfocal. Since there was concern for possible brain abscess blood cultures obtained. Patient is placed on ceftriaxone for possible UTI.  Review of Systems: As per HPI, rest all negative.   Past Medical History:  Diagnosis Date  . Age-related osteoporosis without current pathological fracture   . Anxiety   . Aortic aneurysm, abdominal (HCC) 9 cm  . Chronic obstructive pulmonary disease ([place])   . Depression   . Exposure of implanted vaginal mesh and prosthetic material in vagina ([place])   . GERD (gastroesophageal reflux disease)   . Hypertension   . Nicotine dependence   . Uterus cancer ([place])   . Viral hepatitis C   . Vitamin D deficiency     Past Surgical History:  Procedure Laterality Date  . ABDOMINAL HYSTERECTOMY    . BLADDER SURGERY    . CHOLECYSTECTOMY    . ESOPHAGOGASTRODUODENOSCOPY N/A 02/25/2015   Procedure:  ESOPHAGOGASTRODUODENOSCOPY (EGD);  Surgeon: Carol Ada, MD;  Location: [facility] ENDOSCOPY;  Service: Endoscopy;  Laterality: N/A;  . NEUROPLASTY / TRANSPOSITION MEDIAN NERVE AT CARPAL TUNNEL    . RECONSTRUCTION OF NOSE    . TONSILLECTOMY    . TUBAL LIGATION    . TUMOR REMOVAL     Nose     reports that she has been smoking Cigarettes.  She has never used smokeless tobacco. She reports that she does not drink alcohol or use drugs.  Allergies  Allergen Reactions  . Mucinex [Guaifenesin Er] Anaphylaxis  . Nsaids Anaphylaxis    Family History  Problem Relation Age of Onset  . Diabetes Mother   . Breast cancer Mother   . Liver cancer Brother     Prior to Admission medications   Medication Sig Start Date End Date Taking? Authorizing Provider  ALPRAZolam Duanne Moron) 0.5 MG tablet Take 0.5 mg by mouth 2 (two) times daily as needed for anxiety.    Yes Historical Provider, MD  aspirin EC 81 MG tablet Take 81 mg by mouth every 6 (six) hours as needed for mild pain.   Yes Historical Provider, MD  atorvastatin (LIPITOR) 40 MG tablet Take 1 tablet (40 mg total) by mouth daily at 6 PM. 02/26/15  Yes Hosie Poisson, MD  Fenofibrate (TRICOR PO) Take 1 tablet by mouth daily.   Yes Historical Provider, MD  ferrous sulfate 325 (65 FE) MG tablet Take 1 tablet (325 mg total) by mouth 2 (two) times daily with a meal. 02/26/15  Yes Hosie Poisson, MD  levalbuterol (  XOPENEX) 0.63 MG/3ML nebulizer solution Take 3 mLs (0.63 mg total) by nebulization every 2 (two) hours as needed for wheezing. 02/26/15  Yes Hosie Poisson, MD  lidocaine (XYLOCAINE) 2 % jelly Apply 1 application topically 3 (three) times daily. Patient taking differently: Apply 1 application topically 3 (three) times daily as needed (pain).  02/26/15  Yes Hosie Poisson, MD  Oxycodone HCl 10 MG TABS Take 1 tablet (10 mg total) by mouth 2 (two) times daily as needed. Patient taking differently: Take 10 mg by mouth 2 (two) times daily as needed (pain).  02/26/15  Yes  Hosie Poisson, MD  potassium chloride SA (K-DUR,KLOR-CON) 20 MEQ tablet Take 2 tablets (40 mEq total) by mouth 2 (two) times daily. 02/26/15  Yes Hosie Poisson, MD  tiotropium (SPIRIVA) 18 MCG inhalation capsule Place 1 capsule (18 mcg total) into inhaler and inhale daily. 02/26/15  Yes Hosie Poisson, MD    Physical Exam: Vitals:   06/20/16 0100 06/20/16 0115 06/20/16 0130 06/20/16 0215  BP: (!) 124/59 (!) 122/58 113/61 132/61  Pulse: 88 83 85 83  Resp: '20 20 20 '$ (!) 21  Temp:      TempSrc:      SpO2: 94% 94% 93% 97%  Weight:      Height:          Constitutional: Moderately built and nourished. Vitals:   06/20/16 0100 06/20/16 0115 06/20/16 0130 06/20/16 0215  BP: (!) 124/59 (!) 122/58 113/61 132/61  Pulse: 88 83 85 83  Resp: '20 20 20 '$ (!) 21  Temp:      TempSrc:      SpO2: 94% 94% 93% 97%  Weight:      Height:       Eyes: Anicteric no pallor. ENMT: No discharge from the ears eyes nose and mouth. Neck: No mass felt. No neck rigidity. Respiratory: No rhonchi or crepitations. Cardiovascular: S1-S2. No murmurs appreciated. Abdomen: Soft nontender bowel sounds present. Musculoskeletal: No edema. No joint effusion. Skin: No rash. Skin appears warm. Neurologic: Alert awake oriented to time place and person. Moves all extremities 5 x 5. No facial asymmetry. Tongue is midline. Pupils are equal and reacting to light. Psychiatric: Appears normal. Normal affect.   Labs on Admission: I have personally reviewed following labs and imaging studies  CBC:  Recent Labs Lab 06/19/16 2129 06/19/16 2234  WBC 5.9  --   HGB 11.7* 12.9  HCT 35.8* 38.0  MCV 95.0  --   PLT 217  --    Basic Metabolic Panel:  Recent Labs Lab 06/19/16 2129 06/19/16 2211 06/19/16 2234  NA 136  --  138  K 3.3*  --  3.3*  CL 104  --  105  CO2 21*  --   --   GLUCOSE 103*  --  103*  BUN <5*  --  <3*  CREATININE 0.51  --  0.40*  CALCIUM 8.5*  --   --   MG  --  1.4*  --    GFR: Estimated Creatinine  Clearance: 64.7 mL/min (A) (by C-G formula based on SCr of 0.4 mg/dL (L)). Liver Function Tests:  Recent Labs Lab 06/19/16 2211  AST 27  ALT 11*  ALKPHOS 89  BILITOT 0.4  PROT 7.1  ALBUMIN 3.3*    Recent Labs Lab 06/19/16 2211  LIPASE 30   No results for input(s): AMMONIA in the last 168 hours. Coagulation Profile: No results for input(s): INR, PROTIME in the last 168 hours. Cardiac Enzymes: No results for  input(s): CKTOTAL, CKMB, CKMBINDEX, TROPONINI in the last 168 hours. BNP (last 3 results) No results for input(s): PROBNP in the last 8760 hours. HbA1C: No results for input(s): HGBA1C in the last 72 hours. CBG:  Recent Labs Lab 06/19/16 2152  GLUCAP 122*   Lipid Profile: No results for input(s): CHOL, HDL, LDLCALC, TRIG, CHOLHDL, LDLDIRECT in the last 72 hours. Thyroid Function Tests: No results for input(s): TSH, T4TOTAL, FREET4, T3FREE, THYROIDAB in the last 72 hours. Anemia Panel: No results for input(s): VITAMINB12, FOLATE, FERRITIN, TIBC, IRON, RETICCTPCT in the last 72 hours. Urine analysis:    Component Value Date/Time   COLORURINE YELLOW 06/19/2016 2238   APPEARANCEUR CLOUDY (A) 06/19/2016 2238   LABSPEC 1.010 06/19/2016 2238   PHURINE 5.0 06/19/2016 2238   GLUCOSEU NEGATIVE 06/19/2016 2238   HGBUR LARGE (A) 06/19/2016 2238   BILIRUBINUR NEGATIVE 06/19/2016 2238   [place] 06/19/2016 2238   PROTEINUR NEGATIVE 06/19/2016 2238   UROBILINOGEN 0.2 12/05/2010 0108   NITRITE NEGATIVE 06/19/2016 2238   LEUKOCYTESUR LARGE (A) 06/19/2016 2238   Sepsis Labs: '@LABRCNTIP'$ (procalcitonin:4,lacticidven:4) )No results found for this or any previous visit (from the past 240 hour(s)).   Radiological Exams on Admission: Ct Head Wo Contrast  Result Date: 06/20/2016 CLINICAL DATA:  Seizure and confusion EXAM: CT HEAD WITHOUT CONTRAST TECHNIQUE: Contiguous axial images were obtained from the base of the skull through the vertex without intravenous  contrast. COMPARISON:  06/17/2009 FINDINGS: Brain: Abnormal low-attenuation visualize within the right temporal lobe with additional foci of abnormal low-density in the right frontal and parietal white matter. Possible additional low-attenuation focus in the left posterior parietal white matter. No midline shift. The ventricles are nonenlarged. No definitive hemorrhage. Vascular: No hyperdense vessels.  Carotid artery calcifications. Skull: No fracture or suspicious bone lesion. Sinuses/Orbits: No acute finding. Other: None IMPRESSION: 1. Multifocal areas of abnormal low-density/edema involving the right temporal lobe, right frontal and parietal lobes, and possibly the left parietal lobe. Findings could be secondary to infection or tumor. Contrast-enhanced MRI is recommended for further evaluation. Electronically Signed   By: Donavan Foil M.D.   On: 06/20/2016 00:24   Ct Angio Chest/abd/pel For Dissection W And/or Wo Contrast  Result Date: 06/20/2016 CLINICAL DATA:  Seizure tonight at 20:30.  Confusion. EXAM: CT ANGIOGRAPHY CHEST, ABDOMEN AND PELVIS TECHNIQUE: Multidetector CT imaging through the chest, abdomen and pelvis was performed using the standard protocol during bolus administration of intravenous contrast. Multiplanar reconstructed images and MIPs were obtained and reviewed to evaluate the vascular anatomy. CONTRAST:  100 mL Isovue 370 intravenous COMPARISON:  07/16/2008 FINDINGS: CTA CHEST FINDINGS Cardiovascular: Extensive soft and hard plaque throughout the thoracic aorta. No aneurysm or dissection. Pulmonary arteries are also well opacified, without embolism. Normal heart size. No pericardial effusion. Mediastinum/Nodes: No enlarged mediastinal or hilar nodes. Mild thyroid nodularity, not requiring additional workup. Lungs/Pleura: Centrilobular emphysematous disease, upper lobe predominant. Multiple irregular subcentimeter nodules are present, the largest measuring 8 mm in the left upper lobe on  series 14, image 35. Central airways are patent and normal in caliber. No endobronchial lesions are evident. No pleural effusions. Musculoskeletal: No significant skeletal lesion. Review of the MIP images confirms the above findings. CTA ABDOMEN AND PELVIS FINDINGS VASCULAR Aorta: Extensive soft and hard plaque. Ulcerated plaque at the posterior wall on the left, just below the renal artery origins. 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 No evidence of acute dissection. Celiac: Independent origin of the common hepatic  artery from the aorta, a normal variant. No aneurysm or dissection. SMA: Patent without evidence of aneurysm, dissection, vasculitis or significant stenosis. Renals: Accessory right renal artery to the lower pole. Calcified origins of the renal arteries with mild stenosis on the left and moderate stenosis on the right. No acute dissection or occlusion. IMA: Not visible.  Probable chronic occlusion. Inflow: Heavily calcified bilaterally. Left common iliac artery origin stenosis. Iliac bifurcations are heavily calcified bilaterally but patent. No acute dissection or occlusion. Veins: No obvious venous abnormality within the limitations of this arterial phase study. Review of the MIP images confirms the above findings. NON-VASCULAR Hepatobiliary: No focal liver abnormality is seen. Status post cholecystectomy. No biliary dilatation. Pancreas: Unremarkable. No pancreatic ductal dilatation or surrounding inflammatory changes. Spleen: The spleen is enlarged and contains multiple low-attenuation masses. This is new from 07/16/2008. Cannot exclude lymphoma or other neoplastic process. Adrenals/Urinary Tract: Mildly thickened adrenals bilaterally without discrete mass. No significant renal parenchymal lesion. No urinary calculi. Ureters and urinary bladder are unremarkable. Stomach/Bowel: Stomach is within normal limits. Appendix is normal. No evidence of  bowel wall thickening, distention, or inflammatory changes. Lymphatic: Multiple prominent nodes, predominantly in the para-aortic and common iliac regions, measuring up to 1.2 cm short axis. Reproductive: Status post hysterectomy. No adnexal masses. Other: No focal inflammation.  No ascites. Musculoskeletal: No significant skeletal lesion. Review of the MIP images confirms the above findings. IMPRESSION: 1. Negative for acute aortic dissection or occlusion 2. Extensive soft and hard plaque throughout the thoracic and abdominal aorta. Ulcerated plaque in the abdominal aorta just below the level of the renal arteries. 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 3. 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 Left common iliac origin stenosis. 4. Splenomegaly and multiple low-attenuation splenic masses, new from 07/16/2008. This could represent lymphoma or other neoplastic process. 5. Prominent para-aortic and common iliac nodes. Lymphoma or other neoplasm cannot be excluded. 6. Multiple subcentimeter pulmonary nodules bilaterally. At a minimum, follow-up CT in 3-6 months is recommended. More aggressive evaluation may be warranted, given the splenomegaly and abdominal adenopathy. Electronically Signed   By: Andreas Newport M.D.   On: 06/20/2016 00:53     Assessment/Plan Principal Problem:   Seizures ([place]) Active Problems:   Chronic airway obstruction (HCC)   Lower urinary tract infectious disease   AAA (abdominal aortic aneurysm) without rupture (HCC)   Brain lesion   Seizure ([place])    1. Generalized tonic-clonic seizure with brain lesions - discussed with on-call neurosurgeon Dr. Ellene Route and Dr. Cheral Marker on-call neurologist. Avoiding steroids due to possible lymphoma. Patient is continued on Keppra for seizures. Workup for primary for which interventional radiologist has been consulted to see if patient came have biopsy done in the abdomen mass. Check LDH and sedimentation rate. 2. Subcentimeter  lung nodules and splenic mass and para-aortic lymphadenopathy concerning for lymphoma - see #1. 3. 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 4. COPD - not actively wheezing. 5. History of hepatitis C and cirrhosis per the chart. 6. Iron deficiency anemia on iron supplements. 7. Tobacco abuse - tobacco cessation counseling requested. 8. Hyperlipidemia on statins and TriCor.   DVT prophylaxis: SCDs in anticipation of possible biopsy. Code Status: Full code.  Family Communication: Discussed with patient.  Disposition Plan: Home.  Consults called: Neurosurgery.  Admission status: Inpatient.    Rise Patience MD [facility] Pager [phone].  If 7PM-7AM, please contact night-coverage [email or web address] Password [facility]  06/20/2016,  3:09 AM

## 2016-06-20 NOTE — Progress Notes (Signed)
06/19/2016  9:19 PM  06/20/2016 8:23 AM  [name] was seen and examined.  The H&P by the admitting provider, orders, imaging was reviewed.  Please see orders.  I updated family at bedside about plan of care and they verbalized understanding.  Will continue to follow.   Murvin Natal, MD [facility]

## 2016-06-20 NOTE — ED Notes (Signed)
Patient transported to CT 

## 2016-06-21 ENCOUNTER — Inpatient Hospital Stay (HOSPITAL_COMMUNITY): Payer: Medicaid Other

## 2016-06-21 DIAGNOSIS — R569 Unspecified convulsions: Secondary | ICD-10-CM

## 2016-06-21 DIAGNOSIS — Z8542 Personal history of malignant neoplasm of other parts of uterus: Secondary | ICD-10-CM

## 2016-06-21 DIAGNOSIS — R599 Enlarged lymph nodes, unspecified: Secondary | ICD-10-CM

## 2016-06-21 DIAGNOSIS — G939 Disorder of brain, unspecified: Secondary | ICD-10-CM

## 2016-06-21 DIAGNOSIS — Z720 Tobacco use: Secondary | ICD-10-CM

## 2016-06-21 DIAGNOSIS — R161 Splenomegaly, not elsewhere classified: Secondary | ICD-10-CM

## 2016-06-21 LAB — CBC
HCT: 33 % — ABNORMAL LOW (ref 36.0–46.0)
HEMOGLOBIN: 10.9 g/dL — AB (ref 12.0–15.0)
MCH: 31.8 pg (ref 26.0–34.0)
MCHC: 33 g/dL (ref 30.0–36.0)
MCV: 96.2 fL (ref 78.0–100.0)
Platelets: 218 10*3/uL (ref 150–400)
RBC: 3.43 MIL/uL — AB (ref 3.87–5.11)
RDW: 14.6 % (ref 11.5–15.5)
WBC: 4.5 10*3/uL (ref 4.0–10.5)

## 2016-06-21 LAB — COMPREHENSIVE METABOLIC PANEL
ALK PHOS: 69 U/L (ref 38–126)
ALT: 8 U/L — AB (ref 14–54)
AST: 24 U/L (ref 15–41)
Albumin: 2.8 g/dL — ABNORMAL LOW (ref 3.5–5.0)
Anion gap: 9 (ref 5–15)
CHLORIDE: 106 mmol/L (ref 101–111)
CO2: 24 mmol/L (ref 22–32)
CREATININE: 0.4 mg/dL — AB (ref 0.44–1.00)
Calcium: 8.4 mg/dL — ABNORMAL LOW (ref 8.9–10.3)
GFR calc Af Amer: >60 mL/min (ref >60)
GFR calc non Af Amer: >60 mL/min (ref >60)
GLUCOSE: 98 mg/dL (ref 65–99)
Potassium: 3.7 mmol/L (ref 3.5–5.1)
SODIUM: 139 mmol/L (ref 135–145)
Total Bilirubin: 0.5 mg/dL (ref 0.3–1.2)
Total Protein: 5.8 g/dL — ABNORMAL LOW (ref 6.5–8.1)

## 2016-06-21 LAB — MAGNESIUM: Magnesium: 1.6 mg/dL — ABNORMAL LOW (ref 1.7–2.4)

## 2016-06-21 MED ORDER — FENTANYL CITRATE (PF) 100 MCG/2ML IJ SOLN
INTRAMUSCULAR | Status: AC
  Filled 2016-06-21: qty 4

## 2016-06-21 MED ORDER — DEXAMETHASONE SODIUM PHOSPHATE 10 MG/ML IJ SOLN
20.0000 mg | INTRAMUSCULAR | Status: DC
  Administered 2016-06-21 – 2016-06-23 (×3): 20 mg via INTRAVENOUS
  Filled 2016-06-21: qty 5
  Filled 2016-06-21 (×2): qty 2

## 2016-06-21 MED ORDER — FLUCONAZOLE 100 MG PO TABS
100.0000 mg | ORAL_TABLET | Freq: Every day | ORAL | Status: DC
  Administered 2016-06-21 – 2016-06-23 (×3): 100 mg via ORAL
  Filled 2016-06-21 (×4): qty 1

## 2016-06-21 MED ORDER — MIDAZOLAM HCL 2 MG/2ML IJ SOLN
INTRAMUSCULAR | Status: AC
  Filled 2016-06-21: qty 4

## 2016-06-21 MED ORDER — PANTOPRAZOLE SODIUM 40 MG IV SOLR
40.0000 mg | INTRAVENOUS | Status: DC
  Administered 2016-06-21 – 2016-06-22 (×2): 40 mg via INTRAVENOUS
  Filled 2016-06-21 (×2): qty 40

## 2016-06-21 MED ORDER — METOPROLOL TARTRATE 25 MG PO TABS
25.0000 mg | ORAL_TABLET | Freq: Two times a day (BID) | ORAL | Status: DC
  Administered 2016-06-21 – 2016-06-23 (×5): 25 mg via ORAL
  Filled 2016-06-21 (×6): qty 1

## 2016-06-21 MED ORDER — FENTANYL CITRATE (PF) 100 MCG/2ML IJ SOLN
INTRAMUSCULAR | Status: AC | PRN
  Administered 2016-06-21: 50 ug via INTRAVENOUS
  Administered 2016-06-21 (×2): 25 ug via INTRAVENOUS

## 2016-06-21 MED ORDER — OXYCODONE HCL 5 MG PO TABS
10.0000 mg | ORAL_TABLET | Freq: Once | ORAL | Status: AC
  Administered 2016-06-21: 10 mg via ORAL
  Filled 2016-06-21: qty 2

## 2016-06-21 MED ORDER — MIDAZOLAM HCL 2 MG/2ML IJ SOLN
INTRAMUSCULAR | Status: AC | PRN
  Administered 2016-06-21 (×2): 1 mg via INTRAVENOUS

## 2016-06-21 MED ORDER — LIDOCAINE HCL 1 % IJ SOLN
INTRAMUSCULAR | Status: AC
  Filled 2016-06-21: qty 20

## 2016-06-21 MED ORDER — ENSURE ENLIVE PO LIQD
237.0000 mL | Freq: Two times a day (BID) | ORAL | Status: DC
  Administered 2016-06-22 – 2016-06-23 (×3): 237 mL via ORAL

## 2016-06-21 NOTE — Progress Notes (Signed)
PROGRESS NOTE                                                                                                                                                                                                             Patient Demographics:    [name], is a 61 y.o. female, DOB - 04/08/1955, GEZ:[id number]  Admit date - 06/19/2016   Admitting Physician Rise Patience, MD  Outpatient Primary MD for the patient is Lorelee Market, MD  LOS - 1  Chief Complaint  Patient presents with  . Seizures       Brief Narrative  [name] is a 61 y.o. female with history of COPD, hyperlipidemia, hepatitis C with cirrhosis and abdominal aortic aneurysm was brought to the ER the patient's family witnessed generalized tonic-clonic seizure. Patient was confused after the incident. Patient had been complaining of headache for last few months. Also had some weight loss. Denies any fever or chills. Patient states she has had a bladder mesh placed which at this time as being displaced per vagina weekly.   Subjective:    Maniya Donovan today has, No headache, No chest pain, No abdominal pain - No Nausea, No new weakness tingling or numbness, No Cough - SOB.     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 Currently being followed by neurosurgeon Dr. Ellene Route, patient has been loaded with Keppra and now seizure free, case was discussed with neurologist on call Dr. Cheral Marker as well. If required we'll try steroids post biopsy.  2. Subcentimeter lung nodules and splenic mass and para-aortic lymphadenopathy concerning for lymphoma - IR consulted to attempt CT-guided biopsy today.  3. History of abdominal aneurysm with chronic dissection and possible renal artery stenosis. No abdominal pain, renal function stable monitor. We'll add low-dose beta blocker.  4. History of hepatitis C with  cirrhosis. Outpatient follow-up no acute issues.  5. Chronic COPD and smoking. Requested to quit smoking, COPD stable supportive care only.  6. Dyslipidemia. Continue TriCor and statin.  7. Chronic iron deficiency anemia. Continue iron supplementation.  8. History of bladder mesh/ring placement. Outpatient follow-up with urology/surgeon.  9. UTI. On Rocephin and continue follow cultures.    Diet : Diet NPO time specified Except for: Sips with Meds    Family Communication  :  Husband and son  Code Status :  Full  Disposition Plan  :  TBD  Consults  :  N.Surg, IR, Oncology  Procedures  :    CT Angio Chest-Abd - Splenomegaly with multiple splenic masses, multiple large lymph nodes in the abdomen and pelvis. Multiple subcentimeter lung nodules.  MRI Brain - Multiple masses with some surrounding edema  IR to do CT-guided abdominal biopsy    DVT Prophylaxis  :   SCDs    Lab Results  Component Value Date   PLT 218 06/21/2016    Inpatient Medications  Scheduled Meds: . atorvastatin  40 mg Oral q1800  . fenofibrate  54 mg Oral Daily  . ferrous sulfate  325 mg Oral BID WC  . potassium chloride SA  40 mEq Oral BID  . tiotropium  18 mcg Inhalation Daily   Continuous Infusions: . cefTRIAXone (ROCEPHIN)  IV Stopped (06/20/16 2230)  . levETIRAcetam 500 mg (06/21/16 1217)   PRN Meds:.acetaminophen **OR** acetaminophen, ALPRAZolam, levalbuterol, LORazepam, oxyCODONE  Antibiotics  :    Anti-infectives    Start     Dose/Rate Route Frequency Ordered Stop   06/20/16 2200  cefTRIAXone (ROCEPHIN) 1 g in dextrose 5 % 50 mL IVPB     1 g 100 mL/hr over 30 Minutes Intravenous Every 24 hours 06/20/16 0333     06/20/16 0045  cefTRIAXone (ROCEPHIN) injection 2 g     2 g Intramuscular  Once 06/20/16 0033 06/20/16 0304         Objective:   Vitals:   06/21/16 0731 06/21/16 0800 06/21/16 0826 06/21/16 1233  BP:  (!) 153/56  (!) 145/52  Pulse: 77 81  77  Resp: 16 18  (!) 21    Temp:  98 F (36.7 C)    TempSrc:  Oral    SpO2: 96% 97% 97% 97%  Weight:      Height:        Wt Readings from Last 3 Encounters:  06/20/16 57.3 kg (126 lb 5.2 oz)  02/18/15 52 kg (114 lb 10.2 oz)  09/28/14 52.2 kg (115 lb)     Intake/Output Summary (Last 24 hours) at 06/21/16 1237 Last data filed at 06/21/16 1217  Gross per 24 hour  Intake             2330 ml  Output             1825 ml  Net              505 ml     Physical Exam  Awake Alert, Oriented X 3, No new F.N deficits, Normal affect [place].AT,PERRAL Supple Neck,No JVD, No cervical lymphadenopathy appriciated.  Symmetrical Chest wall movement, Good air movement bilaterally, CTAB RRR,No Gallops,Rubs or new Murmurs, No Parasternal Heave +ve B.Sounds, Abd Soft, No tenderness, No organomegaly appriciated, No rebound - guarding or rigidity. No Cyanosis, Clubbing or edema, No new Rash or bruise       Data Review:    CBC  Recent Labs Lab 06/19/16 2129 06/19/16 2234 06/20/16 0524 06/21/16 0317  WBC 5.9  --  5.6 4.5  HGB 11.7* 12.9 11.2* 10.9*  HCT 35.8* 38.0 34.1* 33.0*  PLT 217  --  196 218  MCV 95.0  --  94.7 96.2  MCH 31.0  --  31.1 31.8  MCHC 32.7  --  32.8 33.0  RDW 14.3  --  14.7 14.6  LYMPHSABS  --   --  1.8  --  MONOABS  --   --  0.5  --   EOSABS  --   --  0.1  --   BASOSABS  --   --  0.0  --     Chemistries   Recent Labs Lab 06/19/16 2129 06/19/16 2211 06/19/16 2234 06/20/16 0524 06/21/16 0317  NA 136  --  138 138 139  K 3.3*  --  3.3* 3.0* 3.7  CL 104  --  105 107 106  CO2 21*  --   --  22 24  GLUCOSE 103*  --  103* 90 98  BUN <5*  --  <3* <5* <5*  CREATININE 0.51  --  0.40* 0.46 0.40*  CALCIUM 8.5*  --   --  8.0* 8.4*  MG  --  1.4*  --   --  1.6*  AST  --  27  --  21 24  ALT  --  11*  --  10* 8*  ALKPHOS  --  89  --  80 69  BILITOT  --  0.4  --  0.3 0.5   ------------------------------------------------------------------------------------------------------------------ No  results for input(s): CHOL, HDL, LDLCALC, TRIG, CHOLHDL, LDLDIRECT in the last 72 hours.  Lab Results  Component Value Date   HGBA1C 5.0 02/19/2015   ------------------------------------------------------------------------------------------------------------------ No results for input(s): TSH, T4TOTAL, T3FREE, THYROIDAB in the last 72 hours.  Invalid input(s): FREET3 ------------------------------------------------------------------------------------------------------------------ No results for input(s): VITAMINB12, FOLATE, FERRITIN, TIBC, IRON, RETICCTPCT in the last 72 hours.  Coagulation profile  Recent Labs Lab 06/20/16 1111  INR 1.04    No results for input(s): DDIMER in the last 72 hours.  Cardiac Enzymes No results for input(s): CKMB, TROPONINI, MYOGLOBIN in the last 168 hours.  Invalid input(s): CK ------------------------------------------------------------------------------------------------------------------ No results found for: BNP  Micro Results Recent Results (from the past 240 hour(s))  Blood culture (routine x 2)     Status: None (Preliminary result)   Collection Time: 06/20/16  2:14 AM  Result Value Ref Range Status   Specimen Description BLOOD RIGHT HAND  Final   Special Requests   Final    BOTTLES DRAWN AEROBIC AND ANAEROBIC Blood Culture adequate volume   Culture NO GROWTH 1 DAY  Final   Report Status PENDING  Incomplete  Blood culture (routine x 2)     Status: None (Preliminary result)   Collection Time: 06/20/16  2:14 AM  Result Value Ref Range Status   Specimen Description BLOOD LEFT HAND  Final   Special Requests   Final    BOTTLES DRAWN AEROBIC AND ANAEROBIC Blood Culture adequate volume   Culture NO GROWTH 1 DAY  Final   Report Status PENDING  Incomplete  MRSA PCR Screening     Status: None   Collection Time: 06/20/16  5:37 AM  Result Value Ref Range Status   MRSA by PCR NEGATIVE NEGATIVE Final    Comment:        The GeneXpert MRSA Assay  (FDA approved for NASAL specimens only), is one component of a comprehensive MRSA colonization surveillance program. It is not intended to diagnose MRSA infection nor to guide or monitor treatment for MRSA infections.     Radiology Reports Ct Head Wo Contrast  Result Date: 06/20/2016 CLINICAL DATA:  Seizure and confusion EXAM: CT HEAD WITHOUT CONTRAST TECHNIQUE: Contiguous axial images were obtained from the base of the skull through the vertex without intravenous contrast. COMPARISON:  06/17/2009 FINDINGS: Brain: Abnormal low-attenuation visualize within the right temporal lobe with additional foci of abnormal  low-density in the right frontal and parietal white matter. Possible additional low-attenuation focus in the left posterior parietal white matter. No midline shift. The ventricles are nonenlarged. No definitive hemorrhage. Vascular: No hyperdense vessels.  Carotid artery calcifications. Skull: No fracture or suspicious bone lesion. Sinuses/Orbits: No acute finding. Other: None IMPRESSION: 1. Multifocal areas of abnormal low-density/edema involving the right temporal lobe, right frontal and parietal lobes, and possibly the left parietal lobe. Findings could be secondary to infection or tumor. Contrast-enhanced MRI is recommended for further evaluation. Electronically Signed   By: Donavan Foil M.D.   On: 06/20/2016 00:24   Mr Brain Wo Contrast  Result Date: 06/20/2016 CLINICAL DATA:  Initial evaluation for brain mass. EXAM: MRI HEAD WITHOUT CONTRAST TECHNIQUE: Multiplanar, multiecho pulse sequences of the brain and surrounding structures were obtained without intravenous contrast. COMPARISON:  Prior CT from 06/19/2016. FINDINGS: Brain: Examination is limited as the patient was unable to complete the exam. Cerebral volume within normal limits. No significant cerebral white matter disease. 2 cm mass centered at the anteromedial right temporal lobe present (series 5, image 10). Associated  vasogenic edema within the adjacent right temporal lobe. Additional mass within the high posterior right frontal lobe measures approximately 19 mm (series 5, image 22). Localized vasogenic edema without significant mass effect. Probable additional small lesions slightly anteriorly (series 5, image 22). Additional vasogenic edema within the bilateral parietal regions also concerning for mass lesions (series 5, image 17, 16, 15). Vasogenic edema at the anterior left insular cortex also suspicious for small underlying lesion (series 6, image 13). Probable additional lesions within the parasagittal anterior left frontal lobe (series 6, image 20) as well as the high posterior left frontal lobe (series 6, image 21, 22). No other definite lesions on this limited noncontrast examination. Findings highly concerning for metastatic disease. No abnormal foci of restricted diffusion to suggest acute or subacute ischemia. Gray-white matter differentiation otherwise maintained. Few scattered foci susceptibility artifact noted within the supratentorial brain, suspected related to metastatic disease, although chronic hemorrhages could also be considered. No evidence for acute intracranial hemorrhage on this limited exam. No extra-axial fluid collection. Ventricles normal size without hydrocephalus. No midline shift. Basilar cisterns are patent. Incidental note made of an empty sella. Suprasellar region normal. Midline structures intact and normal. Major intracranial vascular flow voids are maintained. Skull and upper cervical spine: Craniocervical junction within normal limits. Visualized upper cervical spine unremarkable. Bone marrow signal intensity normal. No scalp soft tissue abnormality. Sinuses/Orbits: Globes and orbital soft tissues within normal limits. Paranasal sinuses are clear. S-shaped bowing of the nasal septum with bilateral concha bullosa noted. No mastoid effusion. Inner ear structures normal. Other: None.  IMPRESSION: 1. Limited study due to patient's inability to tolerate the full length of the exam. 2. Multifocal intracranial mass lesions as above, highly concerning for metastatic disease. A repeat study when the patient is able to tolerate the full length of the exam is recommended. Electronically Signed   By: Jeannine Boga M.D.   On: 06/20/2016 05:16   Ct Angio Chest/abd/pel For Dissection W And/or Wo Contrast  Result Date: 06/20/2016 CLINICAL DATA:  Seizure tonight at 20:30.  Confusion. EXAM: CT ANGIOGRAPHY CHEST, ABDOMEN AND PELVIS TECHNIQUE: Multidetector CT imaging through the chest, abdomen and pelvis was performed using the standard protocol during bolus administration of intravenous contrast. Multiplanar reconstructed images and MIPs were obtained and reviewed to evaluate the vascular anatomy. CONTRAST:  100 mL Isovue 370 intravenous COMPARISON:  07/16/2008 FINDINGS: CTA CHEST FINDINGS Cardiovascular:  Extensive soft and hard plaque throughout the thoracic aorta. No aneurysm or dissection. Pulmonary arteries are also well opacified, without embolism. Normal heart size. No pericardial effusion. Mediastinum/Nodes: No enlarged mediastinal or hilar nodes. Mild thyroid nodularity, not requiring additional workup. Lungs/Pleura: Centrilobular emphysematous disease, upper lobe predominant. Multiple irregular subcentimeter nodules are present, the largest measuring 8 mm in the left upper lobe on series 14, image 35. Central airways are patent and normal in caliber. No endobronchial lesions are evident. No pleural effusions. Musculoskeletal: No significant skeletal lesion. Review of the MIP images confirms the above findings. CTA ABDOMEN AND PELVIS FINDINGS VASCULAR Aorta: Extensive soft and hard plaque. Ulcerated plaque at the posterior wall on the left, just below the renal artery origins. 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 No evidence of acute  dissection. Celiac: Independent origin of the common hepatic artery from the aorta, a normal variant. No aneurysm or dissection. SMA: Patent without evidence of aneurysm, dissection, vasculitis or significant stenosis. Renals: Accessory right renal artery to the lower pole. Calcified origins of the renal arteries with mild stenosis on the left and moderate stenosis on the right. No acute dissection or occlusion. IMA: Not visible.  Probable chronic occlusion. Inflow: Heavily calcified bilaterally. Left common iliac artery origin stenosis. Iliac bifurcations are heavily calcified bilaterally but patent. No acute dissection or occlusion. Veins: No obvious venous abnormality within the limitations of this arterial phase study. Review of the MIP images confirms the above findings. NON-VASCULAR Hepatobiliary: No focal liver abnormality is seen. Status post cholecystectomy. No biliary dilatation. Pancreas: Unremarkable. No pancreatic ductal dilatation or surrounding inflammatory changes. Spleen: The spleen is enlarged and contains multiple low-attenuation masses. This is new from 07/16/2008. Cannot exclude lymphoma or other neoplastic process. Adrenals/Urinary Tract: Mildly thickened adrenals bilaterally without discrete mass. No significant renal parenchymal lesion. No urinary calculi. Ureters and urinary bladder are unremarkable. Stomach/Bowel: Stomach is within normal limits. Appendix is normal. No evidence of bowel wall thickening, distention, or inflammatory changes. Lymphatic: Multiple prominent nodes, predominantly in the para-aortic and common iliac regions, measuring up to 1.2 cm short axis. Reproductive: Status post hysterectomy. No adnexal masses. Other: No focal inflammation.  No ascites. Musculoskeletal: No significant skeletal lesion. Review of the MIP images confirms the above findings. IMPRESSION: 1. Negative for acute aortic dissection or occlusion 2. Extensive soft and hard plaque throughout the thoracic  and abdominal aorta. Ulcerated plaque in the abdominal aorta just below the level of the renal arteries. 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 3. 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 Left common iliac origin stenosis. 4. Splenomegaly and multiple low-attenuation splenic masses, new from 07/16/2008. This could represent lymphoma or other neoplastic process. 5. Prominent para-aortic and common iliac nodes. Lymphoma or other neoplasm cannot be excluded. 6. Multiple subcentimeter pulmonary nodules bilaterally. At a minimum, follow-up CT in 3-6 months is recommended. More aggressive evaluation may be warranted, given the splenomegaly and abdominal adenopathy. Electronically Signed   By: Andreas Newport M.D.   On: 06/20/2016 00:53    Time Spent in minutes  30   Lala Lund M.D on 06/21/2016 at 12:37 PM  Between 7am to 7pm - Pager - [phone] ( page via [email or web address], text pages only, please mention full 10 digit call back number). After 7pm go to [email or web address] - password [email or web address]

## 2016-06-21 NOTE — Progress Notes (Signed)
Initial Nutrition Assessment  DOCUMENTATION CODES:   Severe malnutrition in context of chronic illness  INTERVENTION:    Strawberry Ensure Enlive po BID, each supplement provides 350 kcal and 20 grams of protein  NUTRITION DIAGNOSIS:   Malnutrition (severe) related to chronic illness (COPD) as evidenced by severe depletion of muscle mass, percent weight loss (16% weight loss within 6 months).  GOAL:   Patient will meet greater than or equal to 90% of their needs  MONITOR:   PO intake, Supplement acceptance  REASON FOR ASSESSMENT:   Malnutrition Screening Tool    ASSESSMENT:   61 y.o. female with history of COPD, hyperlipidemia, hepatitis C, cirrhosis, and abdominal aortic aneurysm was brought to the ER after family witnessed generalized tonic-clonic seizure. CT of the head done in the ER shows multiple brain lesions. CT angiogram of the chest, abdomen, and pelvis showed multiple subcentimeter lung nodules with splenic mass and para-aortic lymphadenopathy concerning for possible lymphoma.   Patient's husband reports that patient has been eating a little better since she was started on Megace ~4-5 months ago. She likes strawberry Boost, agreed to try strawberry Ensure between meals to maximize intake. She has been losing weight, was 150 lbs ~4-5 months ago. 16% weight loss within the past 6 months is significant. Nutrition-Focused physical exam completed. Findings are mild-moderate fat depletion, mild-moderate and severe muscle depletion, and no edema.  Labs and medications reviewed.   Diet Order:  Diet regular Room service appropriate? Yes; Fluid consistency: Thin  Skin:  Reviewed, no issues  Last BM:  unknown  Height:   Ht Readings from Last 1 Encounters:  06/20/16 '5\' 8"'$  (1.727 m)    Weight:   Wt Readings from Last 1 Encounters:  06/20/16 126 lb 5.2 oz (57.3 kg)    Ideal Body Weight:  63.6 kg  BMI:  Body mass index is 19.21 kg/m.  Estimated Nutritional  Needs:   Kcal:  1700-1900  Protein:  80-90 gm  Fluid:  1.7 L  EDUCATION NEEDS:   No education needs identified at this time  Molli Barrows, [place], [place], [place] Pager [phone] After Hours Pager [phone]

## 2016-06-21 NOTE — Progress Notes (Signed)
Patient ID: [name], female   DOB: 1955-11-17, 61 y.o.   MRN: [id number] Results of biopsy pending. I can follow up with patient as an outpatient I will be out of town for rest of week. Contact my office if any urgent intervention is needed

## 2016-06-21 NOTE — Progress Notes (Signed)
Pt stated did not receive PRN Oxycodone this am at 0550 per scanned by RN. MD updated, Surveyor, quantity  also updated. Pt order extra dose per MD and Pt and family updated at [facility].

## 2016-06-21 NOTE — Sedation Documentation (Signed)
O2 d/c'd 

## 2016-06-21 NOTE — Consult Note (Addendum)
Referral MD  Reason for Referral: Multiple brain lesions with splenomegaly and with lymphadenopathy.  Chief Complaint  Patient presents with  . Seizures  : I'm not sure why I'm in the hospital.  HPI: [name] is a very nice 61 year old white female. She is being seen with her husband and youngest daughter.  She was admitted because of a seizure at home. She never had had a seizure.  Her history is interesting in that she has a history of uterine cancer. She says that she apparently is bleeding from the "female area". She does not see a gynecologist.  According to her daughter, she's lost about 25 pounds in 4 months. Her appetite is okay.  She was admitted on April 30. She apparently had a seizure at home.  She ultimately had an MRI of the brain. The exam was suboptimal. However, she was noted to have multifocal intracranial lesions. She has some vasogenic edema. She had a 2 cm mass in the anterior medial right temporal lobe.  She had a CT angiogram done. This showed splenomegaly with multiple low attenuation splenic masses. She had prominent aortic and iliac nodes. Showed multiple subsegmental meter pulmonary nodules.  She does smoke. She really cannot tell me how much she smokes. She does not drink.  There is a history of lymphoma in her family. Both her parents apparently died of lymphoma.  Her lab work shows mild anemia. Her white count was 4.5. Hemoglobin 10.9. Platelet count 218,000. Her sodium is 139. Potassium 3.7. Her calcium is 8.4 with an albumin of 2.8. Her liver function tests are normal.  She had her last mammogram a year ago. She's not noted any obvious changes in her breasts.  She's not noted any obvious palpable lymph glands.  She's had no leg swelling. She's had no rashes.  Again she says she's had some "female bleeding" for a while.  She had a cholecystectomy back in 2011.  She had an upper endoscopy in regenerated 2017. This was relatively  unremarkable.  Her echocardiogram was done back in December 2016. She had a good ejection fraction of 65 and 70%. Everything was good with her valves.  She has had no visual problems. She's had no headaches.  Overall, she seemed to be quite active before she had this seizure at home.     Past Medical History:  Diagnosis Date  . Age-related osteoporosis without current pathological fracture   . Anxiety   . Aortic aneurysm, abdominal ([facility]) 9 cm  . Chronic obstructive pulmonary disease ([place])   . Depression   . Exposure of implanted vaginal mesh and prosthetic material in vagina ([place])   . GERD (gastroesophageal reflux disease)   . Hypertension   . Nicotine dependence   . Uterus cancer ([place])   . Viral hepatitis C   . Vitamin D deficiency   :  Past Surgical History:  Procedure Laterality Date  . ABDOMINAL HYSTERECTOMY    . BLADDER SURGERY    . CHOLECYSTECTOMY    . ESOPHAGOGASTRODUODENOSCOPY N/A 02/25/2015   Procedure: ESOPHAGOGASTRODUODENOSCOPY (EGD);  Surgeon: Carol Ada, MD;  Location: [facility] ENDOSCOPY;  Service: Endoscopy;  Laterality: N/A;  . NEUROPLASTY / TRANSPOSITION MEDIAN NERVE AT CARPAL TUNNEL    . RECONSTRUCTION OF NOSE    . TONSILLECTOMY    . TUBAL LIGATION    . TUMOR REMOVAL     Nose  :   Current Facility-Administered Medications:  .  acetaminophen (TYLENOL) tablet 650 mg, 650 mg, Oral, Q6H PRN, 650 mg  at 06/21/16 1601 **OR** acetaminophen (TYLENOL) suppository 650 mg, 650 mg, Rectal, Q6H PRN, Rise Patience, MD .  ALPRAZolam Duanne Moron) tablet 0.5 mg, 0.5 mg, Oral, BID PRN, Rise Patience, MD .  atorvastatin (LIPITOR) tablet 40 mg, 40 mg, Oral, q1800, Rise Patience, MD, 40 mg at 06/20/16 1727 .  cefTRIAXone (ROCEPHIN) 1 g in dextrose 5 % 50 mL IVPB, 1 g, Intravenous, Q24H, Rise Patience, MD, Stopped at 06/20/16 2230 .  feeding supplement (ENSURE ENLIVE) (ENSURE ENLIVE) liquid 237 mL, 237 mL, Oral, BID BM, Thurnell Lose, MD .  fenofibrate  tablet 54 mg, 54 mg, Oral, Daily, Rise Patience, MD, 54 mg at 06/21/16 1508 .  fentaNYL (SUBLIMAZE) 100 MCG/2ML injection, , , ,  .  ferrous sulfate tablet 325 mg, 325 mg, Oral, BID WC, Rise Patience, MD, 325 mg at 06/21/16 0858 .  levalbuterol (XOPENEX) nebulizer solution 0.63 mg, 0.63 mg, Nebulization, Q2H PRN, Rise Patience, MD .  levETIRAcetam (KEPPRA) 500 mg in sodium chloride 0.9 % 100 mL IVPB, 500 mg, Intravenous, BID, Rise Patience, MD, Stopped at 06/21/16 1232 .  lidocaine (XYLOCAINE) 1 % (with pres) injection, , , ,  .  LORazepam (ATIVAN) injection 1 mg, 1 mg, Intravenous, Q3H PRN, Clanford Marisa Hua, MD .  metoprolol tartrate (LOPRESSOR) tablet 25 mg, 25 mg, Oral, BID, Thurnell Lose, MD, 25 mg at 06/21/16 1508 .  midazolam (VERSED) 2 MG/2ML injection, , , ,  .  oxyCODONE (Oxy IR/ROXICODONE) immediate release tablet 10 mg, 10 mg, Oral, BID PRN, Rise Patience, MD, 10 mg at 06/21/16 0550 .  potassium chloride SA (K-DUR,KLOR-CON) CR tablet 40 mEq, 40 mEq, Oral, BID, Clanford Marisa Hua, MD, 40 mEq at 06/21/16 1508 .  tiotropium (SPIRIVA) inhalation capsule 18 mcg, 18 mcg, Inhalation, Daily, Rise Patience, MD, 18 mcg at 06/21/16 0826:  . atorvastatin  40 mg Oral q1800  . feeding supplement (ENSURE ENLIVE)  237 mL Oral BID BM  . fenofibrate  54 mg Oral Daily  . fentaNYL      . ferrous sulfate  325 mg Oral BID WC  . lidocaine      . metoprolol tartrate  25 mg Oral BID  . midazolam      . potassium chloride SA  40 mEq Oral BID  . tiotropium  18 mcg Inhalation Daily  :  Allergies  Allergen Reactions  . Mucinex [Guaifenesin Er] Anaphylaxis  . Nsaids Anaphylaxis  :  Family History  Problem Relation Age of Onset  . Diabetes Mother   . Breast cancer Mother   . Liver cancer Brother   :  Social History   Social History  . Marital status: Divorced    Spouse name: N/A  . Number of children: N/A  . Years of education: N/A   Occupational  History  . Not on file.   Social History Main Topics  . Smoking status: Current Every Day Smoker    Types: Cigarettes  . Smokeless tobacco: Never Used  . Alcohol use No  . Drug use: No  . Sexual activity: Not on file   Other Topics Concern  . Not on file   Social History Narrative  . No narrative on file  :  Pertinent items are noted in HPI.  Exam: Patient Vitals for the past 24 hrs:  BP Temp Temp src Pulse Resp SpO2  06/21/16 1601 140/68 - - 66 14 97 %  06/21/16 1515 128/70 - -  81 13 99 %  06/21/16 1500 (!) 147/65 - - 84 15 97 %  06/21/16 1451 (!) 142/75 - - 85 19 97 %  06/21/16 1447 125/75 - - 87 11 97 %  06/21/16 1430 (!) 174/84 - - 91 15 100 %  06/21/16 1425 - - - 87 14 100 %  06/21/16 1421 (!) 181/90 - - 93 11 100 %  06/21/16 1233 (!) 145/52 - - 77 (!) 21 97 %  06/21/16 0826 - - - - - 97 %  06/21/16 0800 (!) 153/56 98 F (36.7 C) Oral 81 18 97 %  06/21/16 0731 - - - 77 16 96 %  06/21/16 0500 - - - 82 19 92 %  06/21/16 0400 - - - 74 16 96 %  06/21/16 0353 (!) 149/58 97.7 F (36.5 C) Oral 89 19 99 %  06/21/16 0200 - - - 76 15 94 %  06/21/16 0100 - - - 78 20 94 %  06/21/16 0000 135/69 - - 77 16 95 %  06/20/16 2357 135/69 98.2 F (36.8 C) Oral 79 16 95 %  06/20/16 1949 99/63 98.1 F (36.7 C) Oral 87 12 97 %  06/20/16 1900 - - - 91 16 99 %  06/20/16 1800 - - - 84 13 100 %  06/20/16 1700 - - - 82 (!) 21 95 %    As above    Recent Labs  06/20/16 0524 06/21/16 0317  WBC 5.6 4.5  HGB 11.2* 10.9*  HCT 34.1* 33.0*  PLT 196 218    Recent Labs  06/20/16 0524 06/21/16 0317  NA 138 139  K 3.0* 3.7  CL 107 106  CO2 22 24  GLUCOSE 90 98  BUN <5* <5*  CREATININE 0.46 0.40*  CALCIUM 8.0* 8.4*    Blood smear review:  None  Pathology: Pending     Assessment and Plan:  Ms. Bicknell is a very nice 61 year old white female. She has a seizure with multiple brain lesions. She has some splenomegaly with splenic lesions. There is some warmth  adenopathy.  She is a smoker. I would have to think that a bronchogenic carcinoma would be most likely. Small cell carcinoma would be a possibility although there is no dominant lung mass. There is no significant mediastinal adenopathy. There is no liver metastases. However, small cell can certainly metastasize to the spleen.  Of note, she never has had any skin lesions removed. As such, I would think melanoma would be a little bit unlikely although its pattern of spread would be possible.  She has his past history of uterine cancer. She really cannot tell me much more about it. I would not think this would be the case as uterine cancer rarely goes to the brain. However, if she has a uterine carcinosarcoma or uterine sarcoma, I suppose this could go to the brain.  The piece of the puzzle that we need is the biopsy. Maybe, this will be out tomorrow. At least, I can call pathology and maybe they can tell if this is carcinoma or lymphoproliferative.  I would not think that this is a non-malignant process. However, we cannot discount this possibility.  I spent about an hour with she and her family. They're all very nice.  If the biopsy is inconclusive, I would think that one of the splenic lesions would be amenable to a biopsy. I think this would be preferred over having Dr. Ellene Route have to do a brain biopsy.  I suppose  that this is lymphoma, that mantle cell lymphoma would be possible. A high-grade Burkitt's type lymphoma also would be possible.  We will have to await the biopsy results. I cannot think of any additional tests that we need to do right now. A PET scan would twice a day until but given that she is an inpatient, this cannot be done.  I don't think a bone scan would help [place] out. However, given that I suspect this is a bronchogenic carcinoma, maybe a bone scan could be a decent idea.  I will start her on some Decadron. I think this would be reasonable given the mass effect with vasogenic  edema with the brain. I think the benefit outweighs the risk of Decadron. I realize that with potential lymphoma diagnosis, that steroid may affect the pathology results. However, I just do not feel that she needs to run the risk of CNS edema leading to more seizures.  I appreciate the opportunity to have seen her. She has a strong faith. We had good fellowship.  Lattie Haw, MD  Michaelyn Barter 6:7-8

## 2016-06-21 NOTE — Sedation Documentation (Signed)
Awaiting CT readiness

## 2016-06-21 NOTE — Progress Notes (Signed)
PT Cancellation Note  Patient Details Name: [name] MRN: [id number] DOB: [birth year]   Cancelled Treatment:    Reason Eval/Treat Not Completed: Patient at procedure or test/unavailable; patient down for CT guided biopsy.  Will attempt to see another day.   Reginia Naas 06/21/2016, 2:28 PM  Magda Kiel, [place] 06/21/2016

## 2016-06-21 NOTE — Sedation Documentation (Signed)
Patient is resting comfortably. 

## 2016-06-21 NOTE — Sedation Documentation (Signed)
Transport team here, tx back to 4E15.

## 2016-06-21 NOTE — Procedures (Signed)
Rt pelvic adenopathy  s/p CT RT PELVIC ADENOPATHY CORE BX  No comp Stable Full report in PACS EBL 5CC Path pending

## 2016-06-21 NOTE — Sedation Documentation (Addendum)
Moved to ct  table, Pt very uncomfortable lying on stomach.  Assurances given would sedate as soon as possible.  Repostitioned as much as possible.

## 2016-06-22 ENCOUNTER — Inpatient Hospital Stay (HOSPITAL_COMMUNITY): Payer: Medicaid Other

## 2016-06-22 LAB — BASIC METABOLIC PANEL
Anion gap: 12 (ref 5–15)
CHLORIDE: 102 mmol/L (ref 101–111)
CO2: 24 mmol/L (ref 22–32)
CREATININE: 0.45 mg/dL (ref 0.44–1.00)
Calcium: 9.3 mg/dL (ref 8.9–10.3)
GFR calc Af Amer: >60 mL/min (ref >60)
GFR calc non Af Amer: >60 mL/min (ref >60)
GLUCOSE: 192 mg/dL — AB (ref 65–99)
Potassium: 4 mmol/L (ref 3.5–5.1)
Sodium: 138 mmol/L (ref 135–145)

## 2016-06-22 LAB — IRON AND TIBC
Iron: 31 ug/dL (ref 28–170)
SATURATION RATIOS: 13 % (ref 10.4–31.8)
TIBC: 246 ug/dL — ABNORMAL LOW (ref 250–450)
UIBC: 215 ug/dL

## 2016-06-22 LAB — CBC
HEMATOCRIT: 36.3 % (ref 36.0–46.0)
HEMOGLOBIN: 11.7 g/dL — AB (ref 12.0–15.0)
MCH: 30.6 pg (ref 26.0–34.0)
MCHC: 32.2 g/dL (ref 30.0–36.0)
MCV: 95 fL (ref 78.0–100.0)
Platelets: 244 10*3/uL (ref 150–400)
RBC: 3.82 MIL/uL — ABNORMAL LOW (ref 3.87–5.11)
RDW: 14 % (ref 11.5–15.5)
WBC: 3.1 10*3/uL — ABNORMAL LOW (ref 4.0–10.5)

## 2016-06-22 LAB — LACTATE DEHYDROGENASE: LDH: 281 U/L — ABNORMAL HIGH (ref 98–192)

## 2016-06-22 LAB — FERRITIN: FERRITIN: 111 ng/mL (ref 11–307)

## 2016-06-22 MED ORDER — BISACODYL 5 MG PO TBEC
10.0000 mg | DELAYED_RELEASE_TABLET | Freq: Every day | ORAL | Status: DC
  Administered 2016-06-22 – 2016-06-23 (×2): 10 mg via ORAL
  Filled 2016-06-22 (×2): qty 2

## 2016-06-22 MED ORDER — TECHNETIUM TC 99M MEDRONATE IV KIT
25.0000 | PACK | Freq: Once | INTRAVENOUS | Status: AC | PRN
  Administered 2016-06-22: 25 via INTRAVENOUS

## 2016-06-22 MED ORDER — DOCUSATE SODIUM 100 MG PO CAPS
200.0000 mg | ORAL_CAPSULE | Freq: Two times a day (BID) | ORAL | Status: DC
  Administered 2016-06-22 – 2016-06-23 (×3): 200 mg via ORAL
  Filled 2016-06-22 (×3): qty 2

## 2016-06-22 MED ORDER — OXYCODONE HCL 5 MG PO TABS
10.0000 mg | ORAL_TABLET | Freq: Four times a day (QID) | ORAL | Status: DC | PRN
  Administered 2016-06-22 – 2016-06-23 (×3): 10 mg via ORAL
  Filled 2016-06-22 (×4): qty 2

## 2016-06-22 NOTE — Progress Notes (Signed)
PROGRESS NOTE                                                                                                                                                                                                             Patient Demographics:    [name], is a 61 y.o. female, DOB - 03/07/1955, NFA:[id number]  Admit date - 06/19/2016   Admitting Physician Rise Patience, MD  Outpatient Primary MD for the patient is Lorelee Market, MD  LOS - 2  Chief Complaint  Patient presents with  . Seizures       Brief Narrative  [name] is a 61 y.o. female with history of COPD, hyperlipidemia, hepatitis C with cirrhosis and abdominal aortic aneurysm was brought to the ER the patient's family witnessed generalized tonic-clonic seizure. Patient was confused after the incident. Patient had been complaining of headache for last few months. Also had some weight loss. Denies any fever or chills. Patient states she has had a bladder mesh placed which at this time as being displaced per vagina weekly.   Subjective:    [name] today has, No headache, No chest pain, No abdominal pain - No Nausea, No new weakness tingling or numbness, No Cough - SOB.     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 She is seizure-free on combination of Keppra and Decadron, case was discussed by me with neurologist  Dr. Cheral Marker, no further workup. Neurosurgery already on board and will follow outpatient, for now CT-guided abdominal lymph node biopsy is within obtained and we are awaiting results of the same. Oncology also on board.  2. Subcentimeter lung nodules and splenic mass and para-aortic lymphadenopathy concerning for lymphoma - oncology following we await lymph node biopsy results.  3. History of abdominal aneurysm with chronic dissection and possible renal artery stenosis. No abdominal  pain, renal function stable monitor. We'll add low-dose beta blocker.  4. History of hepatitis C with cirrhosis. Outpatient follow-up no acute issues.  5. Chronic COPD and smoking. Counseled to quit smoking, stable though acute issues, supportive care.  6. Dyslipidemia. On combination of TriCor and statin.  7. Chronic iron deficiency anemia. Continue iron supplementation.  8. History of bladder mesh/ring placement. Outpatient follow-up with urology/surgeon.  9. UTI. On Rocephin and continue  follow cultures.    Diet : Diet regular Room service appropriate? Yes; Fluid consistency: Thin    Family Communication  :  Husband and son  Code Status :  Full  Disposition Plan  :  TBD  Consults  :  N.Surg, IR, Oncology  Procedures  :    CT Angio Chest-Abd - Splenomegaly with multiple splenic masses, multiple large lymph nodes in the abdomen and pelvis. Multiple subcentimeter lung nodules.  MRI Brain - Multiple masses with some surrounding edema  IR CT-guided abdominal biopsy 06-21-16   Bone scan  -     DVT Prophylaxis  :   SCDs    Lab Results  Component Value Date   PLT 244 06/22/2016    Inpatient Medications  Scheduled Meds: . atorvastatin  40 mg Oral q1800  . bisacodyl  10 mg Oral Daily  . dexamethasone  20 mg Intravenous Q24H  . docusate sodium  200 mg Oral BID  . feeding supplement (ENSURE ENLIVE)  237 mL Oral BID BM  . fenofibrate  54 mg Oral Daily  . fluconazole  100 mg Oral Daily  . metoprolol tartrate  25 mg Oral BID  . pantoprazole (PROTONIX) IV  40 mg Intravenous Q24H  . potassium chloride SA  40 mEq Oral BID  . tiotropium  18 mcg Inhalation Daily   Continuous Infusions: . cefTRIAXone (ROCEPHIN)  IV 1 g (06/21/16 2132)  . levETIRAcetam Stopped (06/22/16 0947)   PRN Meds:.ALPRAZolam, levalbuterol, LORazepam, oxyCODONE  Antibiotics  :    Anti-infectives    Start     Dose/Rate Route Frequency Ordered Stop   06/21/16 1800  fluconazole (DIFLUCAN) tablet  100 mg     100 mg Oral Daily 06/21/16 1721     06/20/16 2200  cefTRIAXone (ROCEPHIN) 1 g in dextrose 5 % 50 mL IVPB     1 g 100 mL/hr over 30 Minutes Intravenous Every 24 hours 06/20/16 0333     06/20/16 0045  cefTRIAXone (ROCEPHIN) injection 2 g     2 g Intramuscular  Once 06/20/16 0033 06/20/16 0304         Objective:   Vitals:   06/22/16 0420 06/22/16 0500 06/22/16 0710 06/22/16 0752  BP: (!) 141/75  (!) 168/91   Pulse: 75  87   Resp: (!) 24  16   Temp: 97.5 F (36.4 C)  97.5 F (36.4 C)   TempSrc: Oral  Oral   SpO2: 100%  96% 96%  Weight:  57.8 kg (127 lb 6.8 oz)    Height:        Wt Readings from Last 3 Encounters:  06/22/16 57.8 kg (127 lb 6.8 oz)  02/18/15 52 kg (114 lb 10.2 oz)  09/28/14 52.2 kg (115 lb)     Intake/Output Summary (Last 24 hours) at 06/22/16 1010 Last data filed at 06/22/16 0000  Gross per 24 hour  Intake              805 ml  Output                0 ml  Net              805 ml     Physical Exam  Awake Alert, Oriented X 3, No new F.N deficits, Normal affect [place].AT,PERRAL Supple Neck,No JVD, No cervical lymphadenopathy appriciated.  Symmetrical Chest wall movement, Good air movement bilaterally, CTAB RRR,No Gallops,Rubs or new Murmurs, No Parasternal Heave +ve B.Sounds, Abd Soft, No tenderness, No organomegaly  appriciated, No rebound - guarding or rigidity. No Cyanosis, Clubbing or edema, No new Rash or bruise      Data Review:    CBC  Recent Labs Lab 06/19/16 2129 06/19/16 2234 06/20/16 0524 06/21/16 0317 06/22/16 0302  WBC 5.9  --  5.6 4.5 3.1*  HGB 11.7* 12.9 11.2* 10.9* 11.7*  HCT 35.8* 38.0 34.1* 33.0* 36.3  PLT 217  --  196 218 244  MCV 95.0  --  94.7 96.2 95.0  MCH 31.0  --  31.1 31.8 30.6  MCHC 32.7  --  32.8 33.0 32.2  RDW 14.3  --  14.7 14.6 14.0  LYMPHSABS  --   --  1.8  --   --   MONOABS  --   --  0.5  --   --   EOSABS  --   --  0.1  --   --   BASOSABS  --   --  0.0  --   --     Chemistries   Recent  Labs Lab 06/19/16 2129 06/19/16 2211 06/19/16 2234 06/20/16 0524 06/21/16 0317 06/22/16 0302  NA 136  --  138 138 139 138  K 3.3*  --  3.3* 3.0* 3.7 4.0  CL 104  --  105 107 106 102  CO2 21*  --   --  '22 24 24  '$ GLUCOSE 103*  --  103* 90 98 192*  BUN <5*  --  <3* <5* <5* <5*  CREATININE 0.51  --  0.40* 0.46 0.40* 0.45  CALCIUM 8.5*  --   --  8.0* 8.4* 9.3  MG  --  1.4*  --   --  1.6*  --   AST  --  27  --  21 24  --   ALT  --  11*  --  10* 8*  --   ALKPHOS  --  89  --  80 69  --   BILITOT  --  0.4  --  0.3 0.5  --    ------------------------------------------------------------------------------------------------------------------ No results for input(s): CHOL, HDL, LDLCALC, TRIG, CHOLHDL, LDLDIRECT in the last 72 hours.  Lab Results  Component Value Date   HGBA1C 5.0 02/19/2015   ------------------------------------------------------------------------------------------------------------------ No results for input(s): TSH, T4TOTAL, T3FREE, THYROIDAB in the last 72 hours.  Invalid input(s): FREET3 ------------------------------------------------------------------------------------------------------------------  Recent Labs  06/22/16 0302  FERRITIN 111  TIBC 246*  IRON 31    Coagulation profile  Recent Labs Lab 06/20/16 1111  INR 1.04    No results for input(s): DDIMER in the last 72 hours.  Cardiac Enzymes No results for input(s): CKMB, TROPONINI, MYOGLOBIN in the last 168 hours.  Invalid input(s): CK ------------------------------------------------------------------------------------------------------------------ No results found for: BNP  Micro Results Recent Results (from the past 240 hour(s))  Blood culture (routine x 2)     Status: None (Preliminary result)   Collection Time: 06/20/16  2:14 AM  Result Value Ref Range Status   Specimen Description BLOOD RIGHT HAND  Final   Special Requests   Final    BOTTLES DRAWN AEROBIC AND ANAEROBIC Blood Culture  adequate volume   Culture NO GROWTH 2 DAYS  Final   Report Status PENDING  Incomplete  Blood culture (routine x 2)     Status: None (Preliminary result)   Collection Time: 06/20/16  2:14 AM  Result Value Ref Range Status   Specimen Description BLOOD LEFT HAND  Final   Special Requests   Final    BOTTLES DRAWN AEROBIC AND  ANAEROBIC Blood Culture adequate volume   Culture NO GROWTH 2 DAYS  Final   Report Status PENDING  Incomplete  MRSA PCR Screening     Status: None   Collection Time: 06/20/16  5:37 AM  Result Value Ref Range Status   MRSA by PCR NEGATIVE NEGATIVE Final    Comment:        The GeneXpert MRSA Assay (FDA approved for NASAL specimens only), is one component of a comprehensive MRSA colonization surveillance program. It is not intended to diagnose MRSA infection nor to guide or monitor treatment for MRSA infections.     Radiology Reports Ct Head Wo Contrast  Result Date: 06/20/2016 CLINICAL DATA:  Seizure and confusion EXAM: CT HEAD WITHOUT CONTRAST TECHNIQUE: Contiguous axial images were obtained from the base of the skull through the vertex without intravenous contrast. COMPARISON:  06/17/2009 FINDINGS: Brain: Abnormal low-attenuation visualize within the right temporal lobe with additional foci of abnormal low-density in the right frontal and parietal white matter. Possible additional low-attenuation focus in the left posterior parietal white matter. No midline shift. The ventricles are nonenlarged. No definitive hemorrhage. Vascular: No hyperdense vessels.  Carotid artery calcifications. Skull: No fracture or suspicious bone lesion. Sinuses/Orbits: No acute finding. Other: None IMPRESSION: 1. Multifocal areas of abnormal low-density/edema involving the right temporal lobe, right frontal and parietal lobes, and possibly the left parietal lobe. Findings could be secondary to infection or tumor. Contrast-enhanced MRI is recommended for further evaluation. Electronically  Signed   By: Donavan Foil M.D.   On: 06/20/2016 00:24   Mr Brain Wo Contrast  Result Date: 06/20/2016 CLINICAL DATA:  Initial evaluation for brain mass. EXAM: MRI HEAD WITHOUT CONTRAST TECHNIQUE: Multiplanar, multiecho pulse sequences of the brain and surrounding structures were obtained without intravenous contrast. COMPARISON:  Prior CT from 06/19/2016. FINDINGS: Brain: Examination is limited as the patient was unable to complete the exam. Cerebral volume within normal limits. No significant cerebral white matter disease. 2 cm mass centered at the anteromedial right temporal lobe present (series 5, image 10). Associated vasogenic edema within the adjacent right temporal lobe. Additional mass within the high posterior right frontal lobe measures approximately 19 mm (series 5, image 22). Localized vasogenic edema without significant mass effect. Probable additional small lesions slightly anteriorly (series 5, image 22). Additional vasogenic edema within the bilateral parietal regions also concerning for mass lesions (series 5, image 17, 16, 15). Vasogenic edema at the anterior left insular cortex also suspicious for small underlying lesion (series 6, image 13). Probable additional lesions within the parasagittal anterior left frontal lobe (series 6, image 20) as well as the high posterior left frontal lobe (series 6, image 21, 22). No other definite lesions on this limited noncontrast examination. Findings highly concerning for metastatic disease. No abnormal foci of restricted diffusion to suggest acute or subacute ischemia. Gray-white matter differentiation otherwise maintained. Few scattered foci susceptibility artifact noted within the supratentorial brain, suspected related to metastatic disease, although chronic hemorrhages could also be considered. No evidence for acute intracranial hemorrhage on this limited exam. No extra-axial fluid collection. Ventricles normal size without hydrocephalus. No midline  shift. Basilar cisterns are patent. Incidental note made of an empty sella. Suprasellar region normal. Midline structures intact and normal. Major intracranial vascular flow voids are maintained. Skull and upper cervical spine: Craniocervical junction within normal limits. Visualized upper cervical spine unremarkable. Bone marrow signal intensity normal. No scalp soft tissue abnormality. Sinuses/Orbits: Globes and orbital soft tissues within normal limits. Paranasal sinuses are clear.  S-shaped bowing of the nasal septum with bilateral concha bullosa noted. No mastoid effusion. Inner ear structures normal. Other: None. IMPRESSION: 1. Limited study due to patient's inability to tolerate the full length of the exam. 2. Multifocal intracranial mass lesions as above, highly concerning for metastatic disease. A repeat study when the patient is able to tolerate the full length of the exam is recommended. Electronically Signed   By: Jeannine Boga M.D.   On: 06/20/2016 05:16   Ct Biopsy  Result Date: 06/21/2016 INDICATION: PELVIC ADENOPATHY CONCERNING FOR NEOPLASTIC PROCESS OR LYMPHOPROLIFERATIVE DISEASE. EXAM: CT-GUIDED RIGHT PELVIC ILIAC LYMPH NODE BIOPSY MEDICATIONS: 1% lidocaine locally ANESTHESIA/SEDATION: Moderate (conscious) sedation was employed during this procedure. A total of Versed 2.0 mg and Fentanyl 100 mcg was administered intravenously. Moderate Sedation Time: 9 minutes. The patient's level of consciousness and vital signs were monitored continuously by radiology nursing throughout the procedure under my direct supervision. FLUOROSCOPY TIME:  Fluoroscopy Time: None. COMPLICATIONS: None immediate. PROCEDURE: Informed written consent was obtained from the patient after a thorough discussion of the procedural risks, benefits and alternatives. All questions were addressed. Maximal Sterile Barrier Technique was utilized including caps, mask, sterile gowns, sterile gloves, sterile drape, hand hygiene and  skin antiseptic. A timeout was performed prior to the initiation of the procedure. Previous imaging reviewed. Patient positioned prone. Noncontrast localization CT performed. The right pelvic enlarged iliac lymph node was localized. Posterior approach was marked. Under sterile conditions and local anesthesia, a 17 gauge 11.8 cm access needle was advanced from a posterior paraspinous approach to the right pelvic iliac lymph node. Needle position confirmed with CT. 18 gauge core biopsies obtained. Samples were small and fragmented suggesting a benign process. Needle removed. Postprocedure imaging demonstrates a trace amount of surrounding hemorrhage related to the biopsy. No large hematoma. Patient tolerated the biopsy well. IMPRESSION: Successful CT-guided right pelvic iliac lymph node core biopsy. Electronically Signed   By: Jerilynn Mages.  Shick M.D.   On: 06/21/2016 15:22   Ct Angio Chest/abd/pel For Dissection W And/or Wo Contrast  Result Date: 06/20/2016 CLINICAL DATA:  Seizure tonight at 20:30.  Confusion. EXAM: CT ANGIOGRAPHY CHEST, ABDOMEN AND PELVIS TECHNIQUE: Multidetector CT imaging through the chest, abdomen and pelvis was performed using the standard protocol during bolus administration of intravenous contrast. Multiplanar reconstructed images and MIPs were obtained and reviewed to evaluate the vascular anatomy. CONTRAST:  100 mL Isovue 370 intravenous COMPARISON:  07/16/2008 FINDINGS: CTA CHEST FINDINGS Cardiovascular: Extensive soft and hard plaque throughout the thoracic aorta. No aneurysm or dissection. Pulmonary arteries are also well opacified, without embolism. Normal heart size. No pericardial effusion. Mediastinum/Nodes: No enlarged mediastinal or hilar nodes. Mild thyroid nodularity, not requiring additional workup. Lungs/Pleura: Centrilobular emphysematous disease, upper lobe predominant. Multiple irregular subcentimeter nodules are present, the largest measuring 8 mm in the left upper lobe on series  14, image 35. Central airways are patent and normal in caliber. No endobronchial lesions are evident. No pleural effusions. Musculoskeletal: No significant skeletal lesion. Review of the MIP images confirms the above findings. CTA ABDOMEN AND PELVIS FINDINGS VASCULAR Aorta: Extensive soft and hard plaque. Ulcerated plaque at the posterior wall on the left, just below the renal artery origins. 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 No evidence of acute dissection. Celiac: Independent origin of the common hepatic artery from the aorta, a normal variant. No aneurysm or dissection. SMA: Patent without evidence of aneurysm, dissection, vasculitis or significant stenosis. Renals: Accessory right renal  artery to the lower pole. Calcified origins of the renal arteries with mild stenosis on the left and moderate stenosis on the right. No acute dissection or occlusion. IMA: Not visible.  Probable chronic occlusion. Inflow: Heavily calcified bilaterally. Left common iliac artery origin stenosis. Iliac bifurcations are heavily calcified bilaterally but patent. No acute dissection or occlusion. Veins: No obvious venous abnormality within the limitations of this arterial phase study. Review of the MIP images confirms the above findings. NON-VASCULAR Hepatobiliary: No focal liver abnormality is seen. Status post cholecystectomy. No biliary dilatation. Pancreas: Unremarkable. No pancreatic ductal dilatation or surrounding inflammatory changes. Spleen: The spleen is enlarged and contains multiple low-attenuation masses. This is new from 07/16/2008. Cannot exclude lymphoma or other neoplastic process. Adrenals/Urinary Tract: Mildly thickened adrenals bilaterally without discrete mass. No significant renal parenchymal lesion. No urinary calculi. Ureters and urinary bladder are unremarkable. Stomach/Bowel: Stomach is within normal limits. Appendix is normal. No evidence of bowel  wall thickening, distention, or inflammatory changes. Lymphatic: Multiple prominent nodes, predominantly in the para-aortic and common iliac regions, measuring up to 1.2 cm short axis. Reproductive: Status post hysterectomy. No adnexal masses. Other: No focal inflammation.  No ascites. Musculoskeletal: No significant skeletal lesion. Review of the MIP images confirms the above findings. IMPRESSION: 1. Negative for acute aortic dissection or occlusion 2. Extensive soft and hard plaque throughout the thoracic and abdominal aorta. Ulcerated plaque in the abdominal aorta just below the level of the renal arteries. 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 3. 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 Left common iliac origin stenosis. 4. Splenomegaly and multiple low-attenuation splenic masses, new from 07/16/2008. This could represent lymphoma or other neoplastic process. 5. Prominent para-aortic and common iliac nodes. Lymphoma or other neoplasm cannot be excluded. 6. Multiple subcentimeter pulmonary nodules bilaterally. At a minimum, follow-up CT in 3-6 months is recommended. More aggressive evaluation may be warranted, given the splenomegaly and abdominal adenopathy. Electronically Signed   By: Andreas Newport M.D.   On: 06/20/2016 00:53    Time Spent in minutes  30   Lala Lund M.D on 06/22/2016 at 10:10 AM  Between 7am to 7pm - Pager - [phone] ( page via [email or web address], text pages only, please mention full 10 digit call back number). After 7pm go to [email or web address] - password [facility]

## 2016-06-22 NOTE — Evaluation (Signed)
Physical Therapy Evaluation Patient Details Name: [name] MRN: [id number] DOB: Sep 25, 1955 Today's Date: 06/22/2016   History of Present Illness  [name] is a 61 y.o. female with history of COPD, hyperlipidemia, hepatitis C with cirrhosis and abdominal aortic aneurysm was brought to the ER the patient's family witnessed generalized tonic-clonic seizure. Patient was confused after the incident. CT showed multiple brain lesions.  CT of the chest abdomen and pelvis shows splenomegaly and multiple pulmonary nodules in addition to lymphadenopathy.  Now s/p R pelvic core biopsy.   Clinical Impression  Patient presents with decreased independence with mobility due to weakness, imbalance and will benefit from skilled PT in the acute setting to allow return home with family support and follow up HHPT.     Follow Up Recommendations Home health PT;Supervision/Assistance - 24 hour    Equipment Recommendations  Rolling walker with 5" wheels;3in1 (PT)    Recommendations for Other Services       Precautions / Restrictions Precautions Precautions: Fall      Mobility  Bed Mobility Overal bed mobility: Needs Assistance Bed Mobility: Supine to Sit     Supine to sit: Min assist     General bed mobility comments: pulled up on my hand  Transfers Overall transfer level: Needs assistance Equipment used: Rolling walker (2 wheeled) Transfers: Sit to/from Stand Sit to Stand: Min assist;+2 safety/equipment         General transfer comment: cues for hand placement, assist to steady  Ambulation/Gait Ambulation/Gait assistance: Min assist;+2 safety/equipment Ambulation Distance (Feet): 80 Feet Assistive device: Rolling walker (2 wheeled) Gait Pattern/deviations: Step-through pattern;Decreased stride length     General Gait Details: slower pace and assist due to weakness  Stairs            Wheelchair Mobility    Modified Rankin (Stroke Patients Only)       Balance  Overall balance assessment: Needs assistance   Sitting balance-Leahy Scale: Good     Standing balance support: Bilateral upper extremity supported Standing balance-Leahy Scale: Poor Standing balance comment: UE support needed for balance                             Pertinent Vitals/Pain Pain Assessment: No/denies pain    Home Living Family/patient expects to be discharged to:: Private residence Living Arrangements: Spouse/significant other Available Help at Discharge: Family;Available 24 hours/day Type of Home: House Home Access: Stairs to enter Entrance Stairs-Rails: Psychiatric nurse of Steps: 5 Home Layout: One level Home Equipment: Walker - standard;Cane - single point;Shower seat;Grab bars - tub/shower;Hand held shower head      Prior Function Level of Independence: Independent               Hand Dominance   Dominant Hand: Right    Extremity/Trunk Assessment   Upper Extremity Assessment Upper Extremity Assessment: Generalized weakness    Lower Extremity Assessment Lower Extremity Assessment: RLE deficits/detail;LLE deficits/detail RLE Deficits / Details: AROM WFL, strength hip flexion 3+/5, knee extensino 4-/5, ankle DF 4/5 LLE Deficits / Details: AROM WFL, strength hip flexion 3+/5, knee extensino 4-/5, ankle DF 4/5       Communication   Communication: No difficulties  Cognition Arousal/Alertness: Awake/alert Behavior During Therapy: WFL for tasks assessed/performed Overall Cognitive Status: Within Functional Limits for tasks assessed  General Comments General comments (skin integrity, edema, etc.): Spouse in room and reports he will be there or daughter or son so she has 24/7 assist.    Exercises     Assessment/Plan    PT Assessment Patient needs continued PT services  PT Problem List Decreased strength;Decreased activity tolerance;Decreased balance;Decreased  knowledge of use of DME;Decreased mobility       PT Treatment Interventions DME instruction;Gait training;Stair training;Functional mobility training;Balance training;Therapeutic exercise;Patient/family education;Therapeutic activities    PT Goals (Current goals can be found in the Care Plan section)  Acute Rehab PT Goals Patient Stated Goal: to return home PT Goal Formulation: With patient/family Time For Goal Achievement: 06/29/16 Potential to Achieve Goals: Good    Frequency Min 3X/week   Barriers to discharge        Co-evaluation               AM-PAC PT "6 Clicks" Daily Activity  Outcome Measure Difficulty turning over in bed (including adjusting bedclothes, sheets and blankets)?: None Difficulty moving from lying on back to sitting on the side of the bed? : Total Difficulty sitting down on and standing up from a chair with arms (e.g., wheelchair, bedside commode, etc,.)?: Total Help needed moving to and from a bed to chair (including a wheelchair)?: A Little Help needed walking in hospital room?: A Little Help needed climbing 3-5 steps with a railing? : A Lot 6 Click Score: 14    End of Session Equipment Utilized During Treatment: Gait belt Activity Tolerance: Patient limited by fatigue Patient left: in chair   PT Visit Diagnosis: Muscle weakness (generalized) (M62.81);Other abnormalities of gait and mobility (R26.89)    Time: 7195-9747 PT Time Calculation (min) (ACUTE ONLY): 23 min   Charges:   PT Evaluation $PT Eval Moderate Complexity: 1 Procedure PT Treatments $Gait Training: 8-22 mins   PT G CodesMagda Kiel, [place] [phone] 06/22/2016   Reginia Naas 06/22/2016, 2:02 PM

## 2016-06-23 DIAGNOSIS — R740 Nonspecific elevation of levels of transaminase and lactic acid dehydrogenase [LDH]: Secondary | ICD-10-CM

## 2016-06-23 DIAGNOSIS — R938 Abnormal findings on diagnostic imaging of other specified body structures: Secondary | ICD-10-CM

## 2016-06-23 DIAGNOSIS — B1920 Unspecified viral hepatitis C without hepatic coma: Secondary | ICD-10-CM

## 2016-06-23 DIAGNOSIS — D509 Iron deficiency anemia, unspecified: Secondary | ICD-10-CM

## 2016-06-23 MED ORDER — OXYCODONE HCL 5 MG PO TABS
5.0000 mg | ORAL_TABLET | ORAL | Status: DC | PRN
  Administered 2016-06-23: 10 mg via ORAL
  Administered 2016-06-23: 5 mg via ORAL
  Administered 2016-06-23 – 2016-06-24 (×3): 10 mg via ORAL
  Filled 2016-06-23 (×5): qty 2

## 2016-06-23 MED ORDER — OXYCODONE HCL 5 MG PO TABS: 5.0000 mg | ORAL_TABLET | ORAL | Status: DC | PRN

## 2016-06-23 MED ORDER — PANTOPRAZOLE SODIUM 40 MG PO TBEC
40.0000 mg | DELAYED_RELEASE_TABLET | Freq: Every day | ORAL | Status: DC
  Administered 2016-06-23: 40 mg via ORAL
  Filled 2016-06-23: qty 1

## 2016-06-23 NOTE — Progress Notes (Signed)
PROGRESS NOTE                                                                                                                                                                                                             Patient Demographics:    [name], is a 61 y.o. female, DOB - Sep 10, 1955, YHC:[id number]  Admit date - 06/19/2016   Admitting Physician Rise Patience, MD  Outpatient Primary MD for the patient is Lorelee Market, MD  LOS - 3  Chief Complaint  Patient presents with  . Seizures       Brief Narrative  [name] is a 61 y.o. female with history of COPD, hyperlipidemia, hepatitis C with cirrhosis and abdominal aortic aneurysm was brought to the ER the patient's family witnessed generalized tonic-clonic seizure. Patient was confused after the incident. Patient had been complaining of headache for last few months. Also had some weight loss. Denies any fever or chills. Patient states she has had a bladder mesh placed which at this time as being displaced per vagina weekly.   Subjective:   Patient in bed, appears comfortable, denies any headache, no fever, no chest pain or pressure, no shortness of breath , no abdominal pain. No focal weakness.   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 She is seizure-free on combination of Keppra and Decadron, case was discussed by me with neurologist  Dr. Cheral Marker, no further workup. Seen by neurosurgery and oncology, she underwent CT-guided lumbar lymph node biopsy results are pending, further management based on biopsy results.  2. Subcentimeter lung nodules and splenic mass and para-aortic lymphadenopathy concerning for lymphoma - oncology following we await lymph node biopsy results.  3. History of abdominal aneurysm with chronic dissection and possible renal artery stenosis. No abdominal pain, renal function  stable monitor. Have added low-dose beta blocker thereafter outpatient follow-up with PCP and vascular surgery is needed.  4. History of hepatitis C with cirrhosis. Outpatient follow-up no acute issues.  5. Chronic COPD and smoking. No acute issues counseled to quit smoking, continue with supportive care as needed.  6. Dyslipidemia. Continue on TriCor and statin.  7. Chronic iron deficiency anemia. On iron supplementation, hematology following.  8. History of bladder mesh/ring placement. Outpatient follow-up with urology/surgeon.  9. UTI. Completed  Rocephin treatment.    Diet : Diet regular Room service appropriate? Yes; Fluid consistency: Thin    Family Communication  :  Husband and son  Code Status :  Full  Disposition Plan  :  HHPT  Consults  :  N.Surg, IR, Oncology  Procedures  :    CT Angio Chest-Abd - Splenomegaly with multiple splenic masses, multiple large lymph nodes in the abdomen and pelvis. Multiple subcentimeter lung nodules.  MRI Brain - Multiple masses with some surrounding edema  IR CT-guided abdominal biopsy 06-21-16   Bone scan  - specific mild lumbar area increased uptake    DVT Prophylaxis  :   SCDs    Lab Results  Component Value Date   PLT 244 06/22/2016    Inpatient Medications  Scheduled Meds: . atorvastatin  40 mg Oral q1800  . bisacodyl  10 mg Oral Daily  . dexamethasone  20 mg Intravenous Q24H  . docusate sodium  200 mg Oral BID  . feeding supplement (ENSURE ENLIVE)  237 mL Oral BID BM  . fenofibrate  54 mg Oral Daily  . fluconazole  100 mg Oral Daily  . metoprolol tartrate  25 mg Oral BID  . pantoprazole (PROTONIX) IV  40 mg Intravenous Q24H  . potassium chloride SA  40 mEq Oral BID  . tiotropium  18 mcg Inhalation Daily   Continuous Infusions: . levETIRAcetam 500 mg (06/23/16 0901)   PRN Meds:.ALPRAZolam, levalbuterol, LORazepam, oxyCODONE  Antibiotics  :    Anti-infectives    Start     Dose/Rate Route Frequency Ordered  Stop   06/21/16 1800  fluconazole (DIFLUCAN) tablet 100 mg     100 mg Oral Daily 06/21/16 1721     06/20/16 2200  cefTRIAXone (ROCEPHIN) 1 g in dextrose 5 % 50 mL IVPB     1 g 100 mL/hr over 30 Minutes Intravenous Every 24 hours 06/20/16 0333 06/22/16 2240   06/20/16 0045  cefTRIAXone (ROCEPHIN) injection 2 g     2 g Intramuscular  Once 06/20/16 0033 06/20/16 0304         Objective:   Vitals:   06/23/16 0420 06/23/16 0500 06/23/16 0751 06/23/16 0902  BP: (!) 142/63  (!) 141/61   Pulse: 66  64 64  Resp: 12  (!) 8 14  Temp: 98.2 F (36.8 C)  97.9 F (36.6 C)   TempSrc: Oral  Oral   SpO2: 94%  97% 94%  Weight:  58 kg (127 lb 13.9 oz)    Height:        Wt Readings from Last 3 Encounters:  06/23/16 58 kg (127 lb 13.9 oz)  02/18/15 52 kg (114 lb 10.2 oz)  09/28/14 52.2 kg (115 lb)     Intake/Output Summary (Last 24 hours) at 06/23/16 0928 Last data filed at 06/22/16 2225  Gross per 24 hour  Intake              445 ml  Output                0 ml  Net              445 ml     Physical Exam  Awake Alert, Oriented X 3, No new F.N deficits, Normal affect [place].AT,PERRAL Supple Neck,No JVD, No cervical lymphadenopathy appriciated.  Symmetrical Chest wall movement, Good air movement bilaterally, CTAB RRR,No Gallops,Rubs or new Murmurs, No Parasternal Heave +ve B.Sounds, Abd Soft, No tenderness, No organomegaly appriciated, No rebound -  guarding or rigidity. No Cyanosis, Clubbing or edema, No new Rash or bruise   Data Review:    CBC  Recent Labs Lab 06/19/16 2129 06/19/16 2234 06/20/16 0524 06/21/16 0317 06/22/16 0302  WBC 5.9  --  5.6 4.5 3.1*  HGB 11.7* 12.9 11.2* 10.9* 11.7*  HCT 35.8* 38.0 34.1* 33.0* 36.3  PLT 217  --  196 218 244  MCV 95.0  --  94.7 96.2 95.0  MCH 31.0  --  31.1 31.8 30.6  MCHC 32.7  --  32.8 33.0 32.2  RDW 14.3  --  14.7 14.6 14.0  LYMPHSABS  --   --  1.8  --   --   MONOABS  --   --  0.5  --   --   EOSABS  --   --  0.1  --   --     BASOSABS  --   --  0.0  --   --     Chemistries   Recent Labs Lab 06/19/16 2129 06/19/16 2211 06/19/16 2234 06/20/16 0524 06/21/16 0317 06/22/16 0302  NA 136  --  138 138 139 138  K 3.3*  --  3.3* 3.0* 3.7 4.0  CL 104  --  105 107 106 102  CO2 21*  --   --  '22 24 24  '$ GLUCOSE 103*  --  103* 90 98 192*  BUN <5*  --  <3* <5* <5* <5*  CREATININE 0.51  --  0.40* 0.46 0.40* 0.45  CALCIUM 8.5*  --   --  8.0* 8.4* 9.3  MG  --  1.4*  --   --  1.6*  --   AST  --  27  --  21 24  --   ALT  --  11*  --  10* 8*  --   ALKPHOS  --  89  --  80 69  --   BILITOT  --  0.4  --  0.3 0.5  --    ------------------------------------------------------------------------------------------------------------------ No results for input(s): CHOL, HDL, LDLCALC, TRIG, CHOLHDL, LDLDIRECT in the last 72 hours.  Lab Results  Component Value Date   HGBA1C 5.0 02/19/2015   ------------------------------------------------------------------------------------------------------------------ No results for input(s): TSH, T4TOTAL, T3FREE, THYROIDAB in the last 72 hours.  Invalid input(s): FREET3 ------------------------------------------------------------------------------------------------------------------  Recent Labs  06/22/16 0302  FERRITIN 111  TIBC 246*  IRON 31    Coagulation profile  Recent Labs Lab 06/20/16 1111  INR 1.04    No results for input(s): DDIMER in the last 72 hours.  Cardiac Enzymes No results for input(s): CKMB, TROPONINI, MYOGLOBIN in the last 168 hours.  Invalid input(s): CK ------------------------------------------------------------------------------------------------------------------ No results found for: BNP  Micro Results Recent Results (from the past 240 hour(s))  Blood culture (routine x 2)     Status: None (Preliminary result)   Collection Time: 06/20/16  2:14 AM  Result Value Ref Range Status   Specimen Description BLOOD RIGHT HAND  Final   Special  Requests   Final    BOTTLES DRAWN AEROBIC AND ANAEROBIC Blood Culture adequate volume   Culture NO GROWTH 2 DAYS  Final   Report Status PENDING  Incomplete  Blood culture (routine x 2)     Status: None (Preliminary result)   Collection Time: 06/20/16  2:14 AM  Result Value Ref Range Status   Specimen Description BLOOD LEFT HAND  Final   Special Requests   Final    BOTTLES DRAWN AEROBIC AND ANAEROBIC Blood Culture adequate volume  Culture NO GROWTH 2 DAYS  Final   Report Status PENDING  Incomplete  MRSA PCR Screening     Status: None   Collection Time: 06/20/16  5:37 AM  Result Value Ref Range Status   MRSA by PCR NEGATIVE NEGATIVE Final    Comment:        The GeneXpert MRSA Assay (FDA approved for NASAL specimens only), is one component of a comprehensive MRSA colonization surveillance program. It is not intended to diagnose MRSA infection nor to guide or monitor treatment for MRSA infections.     Radiology Reports Ct Head Wo Contrast  Result Date: 06/20/2016 CLINICAL DATA:  Seizure and confusion EXAM: CT HEAD WITHOUT CONTRAST TECHNIQUE: Contiguous axial images were obtained from the base of the skull through the vertex without intravenous contrast. COMPARISON:  06/17/2009 FINDINGS: Brain: Abnormal low-attenuation visualize within the right temporal lobe with additional foci of abnormal low-density in the right frontal and parietal white matter. Possible additional low-attenuation focus in the left posterior parietal white matter. No midline shift. The ventricles are nonenlarged. No definitive hemorrhage. Vascular: No hyperdense vessels.  Carotid artery calcifications. Skull: No fracture or suspicious bone lesion. Sinuses/Orbits: No acute finding. Other: None IMPRESSION: 1. Multifocal areas of abnormal low-density/edema involving the right temporal lobe, right frontal and parietal lobes, and possibly the left parietal lobe. Findings could be secondary to infection or tumor.  Contrast-enhanced MRI is recommended for further evaluation. Electronically Signed   By: Donavan Foil M.D.   On: 06/20/2016 00:24   Mr Brain Wo Contrast  Result Date: 06/20/2016 CLINICAL DATA:  Initial evaluation for brain mass. EXAM: MRI HEAD WITHOUT CONTRAST TECHNIQUE: Multiplanar, multiecho pulse sequences of the brain and surrounding structures were obtained without intravenous contrast. COMPARISON:  Prior CT from 06/19/2016. FINDINGS: Brain: Examination is limited as the patient was unable to complete the exam. Cerebral volume within normal limits. No significant cerebral white matter disease. 2 cm mass centered at the anteromedial right temporal lobe present (series 5, image 10). Associated vasogenic edema within the adjacent right temporal lobe. Additional mass within the high posterior right frontal lobe measures approximately 19 mm (series 5, image 22). Localized vasogenic edema without significant mass effect. Probable additional small lesions slightly anteriorly (series 5, image 22). Additional vasogenic edema within the bilateral parietal regions also concerning for mass lesions (series 5, image 17, 16, 15). Vasogenic edema at the anterior left insular cortex also suspicious for small underlying lesion (series 6, image 13). Probable additional lesions within the parasagittal anterior left frontal lobe (series 6, image 20) as well as the high posterior left frontal lobe (series 6, image 21, 22). No other definite lesions on this limited noncontrast examination. Findings highly concerning for metastatic disease. No abnormal foci of restricted diffusion to suggest acute or subacute ischemia. Gray-white matter differentiation otherwise maintained. Few scattered foci susceptibility artifact noted within the supratentorial brain, suspected related to metastatic disease, although chronic hemorrhages could also be considered. No evidence for acute intracranial hemorrhage on this limited exam. No extra-axial  fluid collection. Ventricles normal size without hydrocephalus. No midline shift. Basilar cisterns are patent. Incidental note made of an empty sella. Suprasellar region normal. Midline structures intact and normal. Major intracranial vascular flow voids are maintained. Skull and upper cervical spine: Craniocervical junction within normal limits. Visualized upper cervical spine unremarkable. Bone marrow signal intensity normal. No scalp soft tissue abnormality. Sinuses/Orbits: Globes and orbital soft tissues within normal limits. Paranasal sinuses are clear. S-shaped bowing of the nasal septum with  bilateral concha bullosa noted. No mastoid effusion. Inner ear structures normal. Other: None. IMPRESSION: 1. Limited study due to patient's inability to tolerate the full length of the exam. 2. Multifocal intracranial mass lesions as above, highly concerning for metastatic disease. A repeat study when the patient is able to tolerate the full length of the exam is recommended. Electronically Signed   By: Jeannine Boga M.D.   On: 06/20/2016 05:16   Nm Bone Scan Whole Body  Result Date: 06/22/2016 CLINICAL DATA:  Pulmonary lesions.  Concern for metastatic disease . EXAM: NUCLEAR MEDICINE WHOLE BODY BONE SCAN TECHNIQUE: Whole body anterior and posterior images were obtained approximately 3 hours after intravenous injection of radiopharmaceutical. RADIOPHARMACEUTICALS:  22.0 mCi Technetium-14mMDP IV COMPARISON:  CT 06/19/2016 and 07/16/2008.  Chest x-ray 02/17/2015. FINDINGS: Bilateral renal function excretion. Lumbar scoliosis. Lumbar MRI suggested for further evaluation. An adjacent lower areas of minimal increased activity lumbar spine are most likely degenerative, these changes can also be further evaluated with lumbar spine MRI. No other significant focal bony abnormalities identified. IMPRESSION: 1. Prominent increased activity noted over and upper lumbar vertebral body. Metastatic disease cannot be excluded.  Further evaluation with gadolinium-enhanced lumbar spine MRI should be considered. 2. Multifocal mild areas of increased activity noted in the lumbar spine below the above-described lesion. These changes are most likely degenerative however they can be of further evaluated with MRI. No other focal significant bony abnormalities identified . Electronically Signed   By: TMarcello Moores Register   On: 06/22/2016 15:16   Ct Biopsy  Result Date: 06/21/2016 INDICATION: PELVIC ADENOPATHY CONCERNING FOR NEOPLASTIC PROCESS OR LYMPHOPROLIFERATIVE DISEASE. EXAM: CT-GUIDED RIGHT PELVIC ILIAC LYMPH NODE BIOPSY MEDICATIONS: 1% lidocaine locally ANESTHESIA/SEDATION: Moderate (conscious) sedation was employed during this procedure. A total of Versed 2.0 mg and Fentanyl 100 mcg was administered intravenously. Moderate Sedation Time: 9 minutes. The patient's level of consciousness and vital signs were monitored continuously by radiology nursing throughout the procedure under my direct supervision. FLUOROSCOPY TIME:  Fluoroscopy Time: None. COMPLICATIONS: None immediate. PROCEDURE: Informed written consent was obtained from the patient after a thorough discussion of the procedural risks, benefits and alternatives. All questions were addressed. Maximal Sterile Barrier Technique was utilized including caps, mask, sterile gowns, sterile gloves, sterile drape, hand hygiene and skin antiseptic. A timeout was performed prior to the initiation of the procedure. Previous imaging reviewed. Patient positioned prone. Noncontrast localization CT performed. The right pelvic enlarged iliac lymph node was localized. Posterior approach was marked. Under sterile conditions and local anesthesia, a 17 gauge 11.8 cm access needle was advanced from a posterior paraspinous approach to the right pelvic iliac lymph node. Needle position confirmed with CT. 18 gauge core biopsies obtained. Samples were small and fragmented suggesting a benign process. Needle  removed. Postprocedure imaging demonstrates a trace amount of surrounding hemorrhage related to the biopsy. No large hematoma. Patient tolerated the biopsy well. IMPRESSION: Successful CT-guided right pelvic iliac lymph node core biopsy. Electronically Signed   By: MJerilynn Mages  Shick M.D.   On: 06/21/2016 15:22   Ct Angio Chest/abd/pel For Dissection W And/or Wo Contrast  Result Date: 06/20/2016 CLINICAL DATA:  Seizure tonight at 20:30.  Confusion. EXAM: CT ANGIOGRAPHY CHEST, ABDOMEN AND PELVIS TECHNIQUE: Multidetector CT imaging through the chest, abdomen and pelvis was performed using the standard protocol during bolus administration of intravenous contrast. Multiplanar reconstructed images and MIPs were obtained and reviewed to evaluate the vascular anatomy. CONTRAST:  100 mL Isovue 370 intravenous COMPARISON:  07/16/2008 FINDINGS: CTA CHEST  FINDINGS Cardiovascular: Extensive soft and hard plaque throughout the thoracic aorta. No aneurysm or dissection. Pulmonary arteries are also well opacified, without embolism. Normal heart size. No pericardial effusion. Mediastinum/Nodes: No enlarged mediastinal or hilar nodes. Mild thyroid nodularity, not requiring additional workup. Lungs/Pleura: Centrilobular emphysematous disease, upper lobe predominant. Multiple irregular subcentimeter nodules are present, the largest measuring 8 mm in the left upper lobe on series 14, image 35. Central airways are patent and normal in caliber. No endobronchial lesions are evident. No pleural effusions. Musculoskeletal: No significant skeletal lesion. Review of the MIP images confirms the above findings. CTA ABDOMEN AND PELVIS FINDINGS VASCULAR Aorta: Extensive soft and hard plaque. Ulcerated plaque at the posterior wall on the left, just below the renal artery origins. 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 No evidence of acute dissection. Celiac: Independent origin of the common hepatic  artery from the aorta, a normal variant. No aneurysm or dissection. SMA: Patent without evidence of aneurysm, dissection, vasculitis or significant stenosis. Renals: Accessory right renal artery to the lower pole. Calcified origins of the renal arteries with mild stenosis on the left and moderate stenosis on the right. No acute dissection or occlusion. IMA: Not visible.  Probable chronic occlusion. Inflow: Heavily calcified bilaterally. Left common iliac artery origin stenosis. Iliac bifurcations are heavily calcified bilaterally but patent. No acute dissection or occlusion. Veins: No obvious venous abnormality within the limitations of this arterial phase study. Review of the MIP images confirms the above findings. NON-VASCULAR Hepatobiliary: No focal liver abnormality is seen. Status post cholecystectomy. No biliary dilatation. Pancreas: Unremarkable. No pancreatic ductal dilatation or surrounding inflammatory changes. Spleen: The spleen is enlarged and contains multiple low-attenuation masses. This is new from 07/16/2008. Cannot exclude lymphoma or other neoplastic process. Adrenals/Urinary Tract: Mildly thickened adrenals bilaterally without discrete mass. No significant renal parenchymal lesion. No urinary calculi. Ureters and urinary bladder are unremarkable. Stomach/Bowel: Stomach is within normal limits. Appendix is normal. No evidence of bowel wall thickening, distention, or inflammatory changes. Lymphatic: Multiple prominent nodes, predominantly in the para-aortic and common iliac regions, measuring up to 1.2 cm short axis. Reproductive: Status post hysterectomy. No adnexal masses. Other: No focal inflammation.  No ascites. Musculoskeletal: No significant skeletal lesion. Review of the MIP images confirms the above findings. IMPRESSION: 1. Negative for acute aortic dissection or occlusion 2. Extensive soft and hard plaque throughout the thoracic and abdominal aorta. Ulcerated plaque in the abdominal aorta  just below the level of the renal arteries. 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 3. 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 Left common iliac origin stenosis. 4. Splenomegaly and multiple low-attenuation splenic masses, new from 07/16/2008. This could represent lymphoma or other neoplastic process. 5. Prominent para-aortic and common iliac nodes. Lymphoma or other neoplasm cannot be excluded. 6. Multiple subcentimeter pulmonary nodules bilaterally. At a minimum, follow-up CT in 3-6 months is recommended. More aggressive evaluation may be warranted, given the splenomegaly and abdominal adenopathy. Electronically Signed   By: Andreas Newport M.D.   On: 06/20/2016 00:53    Time Spent in minutes  30   Lala Lund M.D on 06/23/2016 at 9:28 AM  Between 7am to 7pm - Pager - [phone] ( page via [email or web address], text pages only, please mention full 10 digit call back number). After 7pm go to [email or web address] - password [email or web address]

## 2016-06-23 NOTE — Care Management Note (Signed)
Case Management Note  Patient Details  Name: HOA BRIGGS MRN: [id number] Date of Birth: November 05, 1955  Subjective/Objective:      From home with spouse, presents with Seizures. Awaiting pt eval, pt eval rec HHPT,  NCM spoke with patient she chose [facility] for [facility], she has medicaid so can not get the HHPT and HHAIDE, unless she pays privately for this.  She will also need rolling walker and 3 n 1.   Also patient states she has a pitt bull dog , that her husband will need to take out before [facility] rep get there ,this information was given to [facility] who took referral.                Action/Plan: NCM will cont to follow for dc needs.  Expected Discharge Date:                  Expected Discharge Plan:  [place]  In-House Referral:     Discharge planning Services  CM Consult  Post Acute Care Choice:  Durable Medical Equipment, Home Health Choice offered to:  Patient  DME Arranged:  3-N-1, Walker rolling DME Agency:  [place]:  RN [facility] Agency:  [place]  Status of Service:  Completed, signed off  If discussed at [place] of Stay Meetings, dates discussed:    Additional Comments:  Zenon Mayo, RN 06/23/2016, 4:31 PM

## 2016-06-23 NOTE — Progress Notes (Signed)
Still awaiting the results of the biopsy. Hopefully, this will be out today.  The bone scan is inconclusive. There is some slight uptake in the lumbar region. This probably is degenerative in nature.  She clearly has iron deficiency. Her ferritin is 111 with an iron saturation of only 13%.  Her LDH is a little bit elevated. LDH is 281. This could go along with lymphoma.  Again, the key is going to be the biopsy.  She ultimately may need to have one of the brain lesions removed for evaluation.  I find it interesting that she has the hepatitis C. I would not think that this is hepatocellular carcinoma.  We will continue to follow along.  Lattie Haw, MD  Psalm 138:7

## 2016-06-23 NOTE — Progress Notes (Signed)
Physical Therapy Treatment Patient Details Name: [name] MRN: [id number] DOB: [birth year] Today's Date: 06/23/2016    History of Present Illness [name] is a 61 y.o. female with history of COPD, hyperlipidemia, hepatitis C with cirrhosis and abdominal aortic aneurysm was brought to the ER the patient's family witnessed generalized tonic-clonic seizure. Patient was confused after the incident. CT showed multiple brain lesions.  CT of the chest abdomen and pelvis shows splenomegaly and multiple pulmonary nodules in addition to lymphadenopathy.  Now s/p R pelvic core biopsy.     PT Comments    Patient progressing with balance and endurance with ambulation.  Son arrived during session and encouraged to ask staff for assist for walking to bathroom and to sink if desired for washing up or brushing teeth.  Patient will continue to benefit from skilled PT during acute stay and from follow up [place] at d/c.    Follow Up Recommendations  Home health PT;Supervision/Assistance - 24 hour     Equipment Recommendations  Rolling walker with 5" wheels;3in1 (PT)    Recommendations for Other Services       Precautions / Restrictions Precautions Precautions: Fall    Mobility  Bed Mobility Overal bed mobility: Needs Assistance Bed Mobility: Supine to Sit     Supine to sit: Min guard     General bed mobility comments: extra time needed with HOB elevated, assist for safety/balance  Transfers Overall transfer level: Needs assistance Equipment used: Rolling walker (2 wheeled) Transfers: Sit to/from Stand Sit to Stand: Min guard         General transfer comment: cues for hand placement, assist for balance  Ambulation/Gait Ambulation/Gait assistance: Min assist Ambulation Distance (Feet): 125 Feet Assistive device: Rolling walker (2 wheeled) Gait Pattern/deviations: Step-through pattern;Decreased stride length;Trunk flexed     General Gait Details: slow pace, cues for posture,  increased time for turning with walker, pt states, "Yeah, I will need a walker at home."   Stairs            Wheelchair Mobility    Modified Rankin (Stroke Patients Only)       Balance Overall balance assessment: Needs assistance   Sitting balance-Leahy Scale: Good     Standing balance support: During functional activity;No upper extremity supported Standing balance-Leahy Scale: Fair Standing balance comment: able to perform perineal hygiene after toileting without UE support with S; and standing to wash hands at sink and reach for towel without UE A                            Cognition Arousal/Alertness: Awake/alert Behavior During Therapy: WFL for tasks assessed/performed Overall Cognitive Status: Within Functional Limits for tasks assessed                                        Exercises      General Comments        Pertinent Vitals/Pain Pain Assessment: No/denies pain    Home Living                      Prior Function            PT Goals (current goals can now be found in the care plan section) Progress towards PT goals: Progressing toward goals    Frequency    Min 3X/week  PT Plan Current plan remains appropriate    Co-evaluation              AM-PAC PT "6 Clicks" Daily Activity  Outcome Measure  Difficulty turning over in bed (including adjusting bedclothes, sheets and blankets)?: None Difficulty moving from lying on back to sitting on the side of the bed? : None Difficulty sitting down on and standing up from a chair with arms (e.g., wheelchair, bedside commode, etc,.)?: None Help needed moving to and from a bed to chair (including a wheelchair)?: A Little Help needed walking in hospital room?: A Little Help needed climbing 3-5 steps with a railing? : A Lot 6 Click Score: 20    End of Session Equipment Utilized During Treatment: Gait belt Activity Tolerance: Patient tolerated treatment  well Patient left: in chair;with call bell/phone within reach;with family/visitor present;with chair alarm set         Time: 2751-7001 PT Time Calculation (min) (ACUTE ONLY): 20 min  Charges:  $Gait Training: 8-22 mins                    G CodesMagda Bailey, [name] 06/23/2016    [name] 06/23/2016, 10:28 AM

## 2016-06-24 DIAGNOSIS — C3490 Malignant neoplasm of unspecified part of unspecified bronchus or lung: Secondary | ICD-10-CM

## 2016-06-24 DIAGNOSIS — C7931 Secondary malignant neoplasm of brain: Secondary | ICD-10-CM

## 2016-06-24 LAB — CBC
HCT: 37.1 % (ref 36.0–46.0)
Hemoglobin: 11.9 g/dL — ABNORMAL LOW (ref 12.0–15.0)
MCH: 30.8 pg (ref 26.0–34.0)
MCHC: 32.1 g/dL (ref 30.0–36.0)
MCV: 96.1 fL (ref 78.0–100.0)
PLATELETS: 243 10*3/uL (ref 150–400)
RBC: 3.86 MIL/uL — AB (ref 3.87–5.11)
RDW: 14.3 % (ref 11.5–15.5)
WBC: 5 10*3/uL (ref 4.0–10.5)

## 2016-06-24 LAB — COMPREHENSIVE METABOLIC PANEL
ALT: 12 U/L — ABNORMAL LOW (ref 14–54)
AST: 23 U/L (ref 15–41)
Albumin: 3.2 g/dL — ABNORMAL LOW (ref 3.5–5.0)
Alkaline Phosphatase: 67 U/L (ref 38–126)
Anion gap: 13 (ref 5–15)
BUN: 13 mg/dL (ref 6–20)
CHLORIDE: 98 mmol/L — AB (ref 101–111)
CO2: 25 mmol/L (ref 22–32)
CREATININE: 0.6 mg/dL (ref 0.44–1.00)
Calcium: 9.3 mg/dL (ref 8.9–10.3)
GFR calc non Af Amer: >60 mL/min (ref >60)
Glucose, Bld: 184 mg/dL — ABNORMAL HIGH (ref 65–99)
Potassium: 4.8 mmol/L (ref 3.5–5.1)
SODIUM: 136 mmol/L (ref 135–145)
Total Bilirubin: 0.3 mg/dL (ref 0.3–1.2)
Total Protein: 7 g/dL (ref 6.5–8.1)

## 2016-06-24 MED ORDER — ALPRAZOLAM 0.5 MG PO TABS: 0.5000 mg | ORAL_TABLET | Freq: Two times a day (BID) | ORAL | 0 refills | Status: AC | PRN

## 2016-06-24 MED ORDER — METOPROLOL TARTRATE 25 MG PO TABS: 25.0000 mg | ORAL_TABLET | Freq: Two times a day (BID) | ORAL | 0 refills | Status: AC

## 2016-06-24 MED ORDER — OXYCODONE HCL 10 MG PO TABS: 10.0000 mg | ORAL_TABLET | Freq: Two times a day (BID) | ORAL | 0 refills | Status: DC | PRN

## 2016-06-24 MED ORDER — BISACODYL 5 MG PO TBEC: 10.0000 mg | DELAYED_RELEASE_TABLET | Freq: Every day | ORAL | 0 refills | Status: AC | PRN

## 2016-06-24 MED ORDER — DEXAMETHASONE 4 MG PO TABS: 4.0000 mg | ORAL_TABLET | Freq: Two times a day (BID) | ORAL | 0 refills | Status: AC

## 2016-06-24 MED ORDER — DOCUSATE SODIUM 100 MG PO CAPS: 200.0000 mg | ORAL_CAPSULE | Freq: Two times a day (BID) | ORAL | 0 refills | Status: AC

## 2016-06-24 MED ORDER — DEXAMETHASONE 6 MG PO TABS
6.0000 mg | ORAL_TABLET | Freq: Three times a day (TID) | ORAL | Status: DC
Start: 2016-06-24 — End: 2016-06-24
  Filled 2016-06-24: qty 1

## 2016-06-24 MED ORDER — LEVETIRACETAM 500 MG PO TABS: 500.0000 mg | ORAL_TABLET | Freq: Two times a day (BID) | ORAL | 0 refills | Status: AC

## 2016-06-24 NOTE — Progress Notes (Signed)
Pt education completed to include future appointments, current prescriptions and medications, and doctors discharge instructions. Pt alert and oriented, vital signs stable. Pt waiting in room with husband for 3 in 1 and rolling walker. Temp: 97.6 F (36.4 C) (05/05 0738) Temp Source: Oral (05/05 0738) BP: 170/67 (05/05 0738) Pulse Rate: 71 (05/05 0738)  Wray Kearns 06/24/2016 10:00 AM

## 2016-06-24 NOTE — Discharge Instructions (Signed)
Follow with Primary MD Lorelee Market, MD in 7 days   Get CBC, CMP, 2 view Chest X ray checked  by Primary MD or SNF MD in 5-7 days ( we routinely change or add medications that can affect your baseline labs and fluid status, therefore we recommend that you get the mentioned basic workup next visit with your PCP, your PCP may decide not to get them or add new tests based on their clinical decision)  Activity: As tolerated with Full fall precautions use walker/cane & assistance as needed  Disposition Home    Diet:   Heart Healthy    For Heart failure patients - Check your Weight same time everyday, if you gain over 2 pounds, or you develop in leg swelling, experience more shortness of breath or chest pain, call your Primary MD immediately. Follow Cardiac Low Salt Diet and 1.5 lit/day fluid restriction.  On your next visit with your primary care physician please Get Medicines reviewed and adjusted.  Please request your Prim.MD to go over all Hospital Tests and Procedure/Radiological results at the follow up, please get all Hospital records sent to your Prim MD by signing hospital release before you go home.  If you experience worsening of your admission symptoms, develop shortness of breath, life threatening emergency, suicidal or homicidal thoughts you must seek medical attention immediately by calling 911 or calling your MD immediately  if symptoms less severe.  You Must read complete instructions/literature along with all the possible adverse reactions/side effects for all the Medicines you take and that have been prescribed to you. Take any new Medicines after you have completely understood and accpet all the possible adverse reactions/side effects.   Do not drive, operate heavy machinery, perform activities at heights, swimming or participation in water activities or provide baby sitting services if your were admitted for syncope or siezures until you have seen by Primary MD or a  Neurologist and advised to do so again.  Do not drive when taking Pain medications.    Do not take more than prescribed Pain, Sleep and Anxiety Medications  Special Instructions: If you have smoked or chewed Tobacco  in the last 2 yrs please stop smoking, stop any regular Alcohol  and or any Recreational drug use.  Wear Seat belts while driving.   Please note  You were cared for by a hospitalist during your hospital stay. If you have any questions about your discharge medications or the care you received while you were in the hospital after you are discharged, you can call the unit and asked to speak with the hospitalist on call if the hospitalist that took care of you is not available. Once you are discharged, your primary care physician will handle any further medical issues. Please note that NO REFILLS for any discharge medications will be authorized once you are discharged, as it is imperative that you return to your primary care physician (or establish a relationship with a primary care physician if you do not have one) for your aftercare needs so that they can reassess your need for medications and monitor your lab values.

## 2016-06-24 NOTE — Progress Notes (Signed)
Unfortunately, the diagnosis did come back. 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 Her bone scan shows that there is some activity in the lumbosacral spine. There are radiology as was not certain there is actual metastasis and recommended an MRI.  Since this is adenocarcinoma the lung, we will have to send the I'll see off for studies to assess for any genetic "drivers" that we might be able to target. She is a smoker so the chances are of her having a driver mutation will be low.  I think the main thrust of treatment right now has to be radiation therapy. She has multiple brain metastasis.  She does seem to be fairly coherent this morning. She wants to go home. She insists that she will be going home today. She is on Decadron to help with any brain swelling.  Her laboratories is today they pretty good. Her white cell count is 5. Hemoglobin 11.9. Platelet count 243,000. Her calcium is 9.3 with an albumin of 3.2.  We talked a bit about prognosis. I did not give her an actual timeframe. I think this will really be dependent upon the absence or presence of driver mutations with her cancer.  I told her that what she has is treatable but not curable. I told her that our goal was that she be able to have a good quality of life. I want to see her functional. I would like to see her be able to enjoy her family. Patient says that she has a very loving dog by the name of Haynes Dage. She is a 49-year-old pitbull. I just want Ms. Degan to be oh to enjoy her dog.  Again on my sure she will take treatment. I would think that she would.  She definitely needs radiation therapy to the brain first. I would try to get radiation oncology involved.  [place] in the [place]. It would be very difficult for her to make it to my office. I will see if one of the partners that I work with will be able to see her.  Lattie Haw, MD  Darlyn Chamber  29:11

## 2016-06-24 NOTE — Progress Notes (Signed)
CM received call to please have dme delivered to room.  CM notified [facility] [employer] rep, Reggie to please deliver the rolling walker and 3n1 to room so pt can discharge. No other CM needs were communicated.

## 2016-06-24 NOTE — Discharge Summary (Signed)
[name] JTT:[id number] DOB: 10-Jun-1955 DOA: 06/19/2016  PCP: Lorelee Market, MD  Admit date: 06/19/2016  Discharge date: 06/24/2016  Admitted From: Home   Disposition:  Home   Recommendations for Outpatient Follow-up:   Follow up with PCP in 1-2 weeks  PCP Please obtain BMP/CBC, 2 view CXR in 1week,  (see Discharge instructions)   PCP Please follow up on the following pending results: None   Home Health: PT,RN   Equipment/Devices: Walker  Consultations: Oncology, IR Discharge Condition: Fair   CODE STATUS: Full   Diet Recommendation:  Heart Healthy    Chief Complaint  Patient presents with  . Seizures     Brief history of present illness from the day of admission and additional interim summary     Rachael Eisenbeis McBrideis a 61 y.o.femalewith history of COPD, hyperlipidemia, hepatitis C with cirrhosis and abdominal aortic aneurysm was brought to the ER the patient's family witnessed generalized tonic-clonic seizure. Patient was confused after the incident. Patient had been complaining of headache for last few months. Also had some weight loss. Denies any fever or chills. Patient states she has had a bladder mesh placed which at this time as being displaced per vagina weekly.                                                                 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 She is seizure-free on combination of Keppra and Decadron, case was discussed by me with neurologist  Dr. Cheral Marker, no further workup. 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 Marin Olp, at this time she will be discharged on oral  Keppra and Decadron with outpatient oncology follow-up.  2. 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  3. History of abdominal aneurysm with chronic dissection and possible renal artery stenosis. No abdominal pain, renal function stable monitor. Have added low-dose beta blocker thereafter outpatient follow-up with PCP and vascular surgery is needed.  4. History of hepatitis C with cirrhosis. Outpatient follow-up no acute issues.  5. Chronic COPD and smoking. No acute issues counseled to quit smoking, continue with supportive care as needed.  6. Dyslipidemia. Continue on TriCor and statin.  7. Chronic iron deficiency anemia. On iron supplementation, hematology following.  8. History of bladder mesh/ring placement. Outpatient follow-up with urology/surgeon.  9. UTI. 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 She also informs me that her PCP will not prescribe these medications to her I suspect there was an element of overuse. I have given her 1 week supply of her home pain medicines and anxiety medications in the light of her new diagnosis of malignancy, she was persistent and asking for more and so was her husband but I had to politely declined, I have requested her to follow with her PCP for the same.  Discharge diagnosis     Principal Problem:   Seizures ([place]) Active Problems:   Chronic airway obstruction ([facility])   Lower urinary tract infectious disease   AAA (abdominal aortic aneurysm) without rupture (HCC)   Brain lesion   Seizure  [facility])    Discharge instructions    Discharge Instructions    Diet - low sodium heart healthy    Complete by:  As directed    Discharge instructions    Complete by:  As directed    Follow with Primary MD Lorelee Market, MD in 7 days   Get CBC, CMP, 2 view Chest X ray checked  by Primary MD or SNF MD in 5-7 days ( we routinely change or add medications that can affect your baseline labs and fluid status, therefore we recommend that you get the mentioned basic workup next visit with your PCP, your PCP may decide not to get them or add new tests based on their clinical decision)  Activity: As tolerated with Full fall precautions use walker/cane & assistance as needed  Disposition Home    Diet:   Heart Healthy    For Heart failure patients - Check your Weight same time everyday, if you gain over 2 pounds, or you develop in leg swelling, experience more shortness of breath or chest pain, call your Primary MD immediately. Follow Cardiac Low Salt Diet and 1.5 lit/day fluid restriction.  On your next visit with your primary care physician please Get Medicines reviewed and adjusted.  Please request your Prim.MD to go over all Hospital Tests and Procedure/Radiological results at the follow up, please get all Hospital records sent to your Prim MD by signing hospital release before you go home.  If you experience worsening of your admission symptoms, develop shortness of breath, life threatening emergency, suicidal or homicidal thoughts you must seek medical attention immediately by calling 911 or calling your MD immediately  if symptoms less severe.  You Must read complete instructions/literature along with all the possible adverse reactions/side effects for all the Medicines you take and that have been prescribed to you. Take any new Medicines after you have completely understood and accpet all the possible adverse reactions/side effects.   Do not drive, operate heavy machinery, perform  activities at heights, swimming or participation in water activities or provide baby sitting services if your were admitted for syncope or siezures until you have seen by Primary MD or a Neurologist and advised to do so again.  Do not drive when taking Pain medications.    Do not take more than prescribed Pain, Sleep and Anxiety Medications  Special Instructions: If you have smoked or chewed Tobacco  in the last 2 yrs please stop smoking, stop any regular Alcohol  and or any Recreational drug use.  Wear Seat belts while driving.   Please note  You were cared for by a hospitalist during your hospital stay. If you have any questions about your discharge medications  or the care you received while you were in the hospital after you are discharged, you can call the unit and asked to speak with the hospitalist on call if the hospitalist that took care of you is not available. Once you are discharged, your primary care physician will handle any further medical issues. Please note that NO REFILLS for any discharge medications will be authorized once you are discharged, as it is imperative that you return to your primary care physician (or establish a relationship with a primary care physician if you do not have one) for your aftercare needs so that they can reassess your need for medications and monitor your lab values.   Increase activity slowly    Complete by:  As directed       Discharge Medications   Allergies as of 06/24/2016      Reactions   Mucinex [guaifenesin Er] Anaphylaxis   Nsaids Anaphylaxis      Medication List    TAKE these medications   ALPRAZolam 0.5 MG tablet Commonly known as:  XANAX Take 1 tablet (0.5 mg total) by mouth 2 (two) times daily as needed for anxiety.   aspirin EC 81 MG tablet Take 81 mg by mouth every 6 (six) hours as needed for mild pain.   atorvastatin 40 MG tablet Commonly known as:  LIPITOR Take 1 tablet (40 mg total) by mouth daily at 6 PM.    bisacodyl 5 MG EC tablet Commonly known as:  DULCOLAX Take 2 tablets (10 mg total) by mouth daily as needed for moderate constipation.   dexamethasone 4 MG tablet Commonly known as:  DECADRON Take 1 tablet (4 mg total) by mouth 2 (two) times daily. Do not stop takinhg abrubtly, check with the cancer doctor before stopping this medicine   docusate sodium 100 MG capsule Commonly known as:  COLACE Take 2 capsules (200 mg total) by mouth 2 (two) times daily.   fenofibrate 145 MG tablet Commonly known as:  TRICOR Take 145 mg by mouth daily.   ferrous sulfate 325 (65 FE) MG tablet Take 1 tablet (325 mg total) by mouth 2 (two) times daily with a meal.   levalbuterol 0.63 MG/3ML nebulizer solution Commonly known as:  XOPENEX Take 3 mLs (0.63 mg total) by nebulization every 2 (two) hours as needed for wheezing.   levETIRAcetam 500 MG tablet Commonly known as:  KEPPRA Take 1 tablet (500 mg total) by mouth 2 (two) times daily.   lidocaine 2 % jelly Commonly known as:  XYLOCAINE Apply 1 application topically 3 (three) times daily. What changed:  when to take this  reasons to take this   metoprolol tartrate 25 MG tablet Commonly known as:  LOPRESSOR Take 1 tablet (25 mg total) by mouth 2 (two) times daily.   Oxycodone HCl 10 MG Tabs Take 1 tablet (10 mg total) by mouth 2 (two) times daily as needed (pain).   potassium chloride SA 20 MEQ tablet Commonly known as:  K-DUR,KLOR-CON Take 2 tablets (40 mEq total) by mouth 2 (two) times daily.   tiotropium 18 MCG inhalation capsule Commonly known as:  SPIRIVA Place 1 capsule (18 mcg total) into inhaler and inhale daily.            Durable Medical Equipment        Start     Ordered   06/23/16 1624  For home use only DME 3 n 1  Once     06/23/16 1623   06/23/16 1623  For home use only DME Walker rolling  Once    Question:  Patient needs a walker to treat with the following condition  Answer:  Weakness   06/23/16 1623       Follow-up Information    [facility].   Why:  [facility] Contact information: [place] [address] [address] Follow up.   Why:  rolling walker and 3 n 1 will be brought up to patients room before dc Contact information: [place] [address] [phone]        Lorelee Market, MD. Schedule an appointment as soon as possible for a visit in 1 week(s).   Specialty:  Family Medicine Contact information: [place] [address] [phone]        Volanda Napoleon, MD. Schedule an appointment as soon as possible for a visit in 3 day(s).   Specialty:  Oncology Contact information: [address] [place] [address] [phone]           Major procedures and Radiology Reports - PLEASE review detailed and final reports thoroughly  -      CT Angio Chest-Abd - Splenomegaly with multiple splenic masses, multiple large lymph nodes in the abdomen and pelvis. Multiple subcentimeter lung nodules.  MRI Brain - Multiple masses with some surrounding edema  IR CT-guided abdominal biopsy 06-21-16 - Possible primary Lung Adenocarcinoma  Bone scan  - Non specific Lumbar uptake     Ct Head Wo Contrast  Result Date: 06/20/2016 CLINICAL DATA:  Seizure and confusion EXAM: CT HEAD WITHOUT CONTRAST TECHNIQUE: Contiguous axial images were obtained from the base of the skull through the vertex without intravenous contrast. COMPARISON:  06/17/2009 FINDINGS: Brain: Abnormal low-attenuation visualize within the right temporal lobe with additional foci of abnormal low-density in the right frontal and parietal white matter. Possible additional low-attenuation focus in the left posterior parietal white matter. No midline shift. The ventricles are nonenlarged. No definitive hemorrhage. Vascular: No hyperdense vessels.  Carotid artery calcifications.  Skull: No fracture or suspicious bone lesion. Sinuses/Orbits: No acute finding. Other: None IMPRESSION: 1. Multifocal areas of abnormal low-density/edema involving the right temporal lobe, right frontal and parietal lobes, and possibly the left parietal lobe. Findings could be secondary to infection or tumor. Contrast-enhanced MRI is recommended for further evaluation. Electronically Signed   By: Donavan Foil M.D.   On: 06/20/2016 00:24   Mr Brain Wo Contrast  Result Date: 06/20/2016 CLINICAL DATA:  Initial evaluation for brain mass. EXAM: MRI HEAD WITHOUT CONTRAST TECHNIQUE: Multiplanar, multiecho pulse sequences of the brain and surrounding structures were obtained without intravenous contrast. COMPARISON:  Prior CT from 06/19/2016. FINDINGS: Brain: Examination is limited as the patient was unable to complete the exam. Cerebral volume within normal limits. No significant cerebral white matter disease. 2 cm mass centered at the anteromedial right temporal lobe present (series 5, image 10). Associated vasogenic edema within the adjacent right temporal lobe. Additional mass within the high posterior right frontal lobe measures approximately 19 mm (series 5, image 22). Localized vasogenic edema without significant mass effect. Probable additional small lesions slightly anteriorly (series 5, image 22). Additional vasogenic edema within the bilateral parietal regions also concerning for mass lesions (series 5, image 17, 16, 15). Vasogenic edema at the anterior left insular cortex also suspicious for small underlying lesion (series 6, image 13). Probable additional  lesions within the parasagittal anterior left frontal lobe (series 6, image 20) as well as the high posterior left frontal lobe (series 6, image 21, 22). No other definite lesions on this limited noncontrast examination. Findings highly concerning for metastatic disease. No abnormal foci of restricted diffusion to suggest acute or subacute ischemia.  Gray-white matter differentiation otherwise maintained. Few scattered foci susceptibility artifact noted within the supratentorial brain, suspected related to metastatic disease, although chronic hemorrhages could also be considered. No evidence for acute intracranial hemorrhage on this limited exam. No extra-axial fluid collection. Ventricles normal size without hydrocephalus. No midline shift. Basilar cisterns are patent. Incidental note made of an empty sella. Suprasellar region normal. Midline structures intact and normal. Major intracranial vascular flow voids are maintained. Skull and upper cervical spine: Craniocervical junction within normal limits. Visualized upper cervical spine unremarkable. Bone marrow signal intensity normal. No scalp soft tissue abnormality. Sinuses/Orbits: Globes and orbital soft tissues within normal limits. Paranasal sinuses are clear. S-shaped bowing of the nasal septum with bilateral concha bullosa noted. No mastoid effusion. Inner ear structures normal. Other: None. IMPRESSION: 1. Limited study due to patient's inability to tolerate the full length of the exam. 2. Multifocal intracranial mass lesions as above, highly concerning for metastatic disease. A repeat study when the patient is able to tolerate the full length of the exam is recommended. Electronically Signed   By: Jeannine Boga M.D.   On: 06/20/2016 05:16   Nm Bone Scan Whole Body  Result Date: 06/22/2016 CLINICAL DATA:  Pulmonary lesions.  Concern for metastatic disease . EXAM: NUCLEAR MEDICINE WHOLE BODY BONE SCAN TECHNIQUE: Whole body anterior and posterior images were obtained approximately 3 hours after intravenous injection of radiopharmaceutical. RADIOPHARMACEUTICALS:  22.0 mCi Technetium-90mMDP IV COMPARISON:  CT 06/19/2016 and 07/16/2008.  Chest x-ray 02/17/2015. FINDINGS: Bilateral renal function excretion. Lumbar scoliosis. Lumbar MRI suggested for further evaluation. An adjacent lower areas of  minimal increased activity lumbar spine are most likely degenerative, these changes can also be further evaluated with lumbar spine MRI. No other significant focal bony abnormalities identified. IMPRESSION: 1. Prominent increased activity noted over and upper lumbar vertebral body. Metastatic disease cannot be excluded. Further evaluation with gadolinium-enhanced lumbar spine MRI should be considered. 2. Multifocal mild areas of increased activity noted in the lumbar spine below the above-described lesion. These changes are most likely degenerative however they can be of further evaluated with MRI. No other focal significant bony abnormalities identified . Electronically Signed   By: TMarcello Moores Register   On: 06/22/2016 15:16   Ct Biopsy  Result Date: 06/21/2016 INDICATION: PELVIC ADENOPATHY CONCERNING FOR NEOPLASTIC PROCESS OR LYMPHOPROLIFERATIVE DISEASE. EXAM: CT-GUIDED RIGHT PELVIC ILIAC LYMPH NODE BIOPSY MEDICATIONS: 1% lidocaine locally ANESTHESIA/SEDATION: Moderate (conscious) sedation was employed during this procedure. A total of Versed 2.0 mg and Fentanyl 100 mcg was administered intravenously. Moderate Sedation Time: 9 minutes. The patient's level of consciousness and vital signs were monitored continuously by radiology nursing throughout the procedure under my direct supervision. FLUOROSCOPY TIME:  Fluoroscopy Time: None. COMPLICATIONS: None immediate. PROCEDURE: Informed written consent was obtained from the patient after a thorough discussion of the procedural risks, benefits and alternatives. All questions were addressed. Maximal Sterile Barrier Technique was utilized including caps, mask, sterile gowns, sterile gloves, sterile drape, hand hygiene and skin antiseptic. A timeout was performed prior to the initiation of the procedure. Previous imaging reviewed. Patient positioned prone. Noncontrast localization CT performed. The right pelvic enlarged iliac lymph node was localized. Posterior approach was  marked. Under sterile conditions and local anesthesia, a 17 gauge 11.8 cm access needle was advanced from a posterior paraspinous approach to the right pelvic iliac lymph node. Needle position confirmed with CT. 18 gauge core biopsies obtained. Samples were small and fragmented suggesting a benign process. Needle removed. Postprocedure imaging demonstrates a trace amount of surrounding hemorrhage related to the biopsy. No large hematoma. Patient tolerated the biopsy well. IMPRESSION: Successful CT-guided right pelvic iliac lymph node core biopsy. Electronically Signed   By: Jerilynn Mages.  Shick M.D.   On: 06/21/2016 15:22   Ct Angio Chest/abd/pel For Dissection W And/or Wo Contrast  Result Date: 06/20/2016 CLINICAL DATA:  Seizure tonight at 20:30.  Confusion. EXAM: CT ANGIOGRAPHY CHEST, ABDOMEN AND PELVIS TECHNIQUE: Multidetector CT imaging through the chest, abdomen and pelvis was performed using the standard protocol during bolus administration of intravenous contrast. Multiplanar reconstructed images and MIPs were obtained and reviewed to evaluate the vascular anatomy. CONTRAST:  100 mL Isovue 370 intravenous COMPARISON:  07/16/2008 FINDINGS: CTA CHEST FINDINGS Cardiovascular: Extensive soft and hard plaque throughout the thoracic aorta. No aneurysm or dissection. Pulmonary arteries are also well opacified, without embolism. Normal heart size. No pericardial effusion. Mediastinum/Nodes: No enlarged mediastinal or hilar nodes. Mild thyroid nodularity, not requiring additional workup. Lungs/Pleura: Centrilobular emphysematous disease, upper lobe predominant. Multiple irregular subcentimeter nodules are present, the largest measuring 8 mm in the left upper lobe on series 14, image 35. Central airways are patent and normal in caliber. No endobronchial lesions are evident. No pleural effusions. Musculoskeletal: No significant skeletal lesion. Review of the MIP images confirms the above findings. CTA ABDOMEN AND PELVIS  FINDINGS VASCULAR Aorta: Extensive soft and hard plaque. Ulcerated plaque at the posterior wall on the left, just below the renal artery origins. 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 No evidence of acute dissection. Celiac: Independent origin of the common hepatic artery from the aorta, a normal variant. No aneurysm or dissection. SMA: Patent without evidence of aneurysm, dissection, vasculitis or significant stenosis. Renals: Accessory right renal artery to the lower pole. Calcified origins of the renal arteries with mild stenosis on the left and moderate stenosis on the right. No acute dissection or occlusion. IMA: Not visible.  Probable chronic occlusion. Inflow: Heavily calcified bilaterally. Left common iliac artery origin stenosis. Iliac bifurcations are heavily calcified bilaterally but patent. No acute dissection or occlusion. Veins: No obvious venous abnormality within the limitations of this arterial phase study. Review of the MIP images confirms the above findings. NON-VASCULAR Hepatobiliary: No focal liver abnormality is seen. Status post cholecystectomy. No biliary dilatation. Pancreas: Unremarkable. No pancreatic ductal dilatation or surrounding inflammatory changes. Spleen: The spleen is enlarged and contains multiple low-attenuation masses. This is new from 07/16/2008. Cannot exclude lymphoma or other neoplastic process. Adrenals/Urinary Tract: Mildly thickened adrenals bilaterally without discrete mass. No significant renal parenchymal lesion. No urinary calculi. Ureters and urinary bladder are unremarkable. Stomach/Bowel: Stomach is within normal limits. Appendix is normal. No evidence of bowel wall thickening, distention, or inflammatory changes. Lymphatic: Multiple prominent nodes, predominantly in the para-aortic and common iliac regions, measuring up to 1.2 cm short axis. Reproductive: Status post hysterectomy. No adnexal masses. Other: No  focal inflammation.  No ascites. Musculoskeletal: No significant skeletal lesion. Review of the MIP images confirms the above findings. IMPRESSION: 1. Negative for acute aortic dissection or occlusion 2. Extensive soft and hard plaque throughout the thoracic and abdominal aorta. Ulcerated plaque in the abdominal aorta just below  the level of the renal arteries. 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 3. 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 Left common iliac origin stenosis. 4. Splenomegaly and multiple low-attenuation splenic masses, new from 07/16/2008. This could represent lymphoma or other neoplastic process. 5. Prominent para-aortic and common iliac nodes. Lymphoma or other neoplasm cannot be excluded. 6. Multiple subcentimeter pulmonary nodules bilaterally. At a minimum, follow-up CT in 3-6 months is recommended. More aggressive evaluation may be warranted, given the splenomegaly and abdominal adenopathy. Electronically Signed   By: Andreas Newport M.D.   On: 06/20/2016 00:53    Micro Results     Recent Results (from the past 240 hour(s))  Blood culture (routine x 2)     Status: None (Preliminary result)   Collection Time: 06/20/16  2:14 AM  Result Value Ref Range Status   Specimen Description BLOOD RIGHT HAND  Final   Special Requests   Final    BOTTLES DRAWN AEROBIC AND ANAEROBIC Blood Culture adequate volume   Culture NO GROWTH 3 DAYS  Final   Report Status PENDING  Incomplete  Blood culture (routine x 2)     Status: None (Preliminary result)   Collection Time: 06/20/16  2:14 AM  Result Value Ref Range Status   Specimen Description BLOOD LEFT HAND  Final   Special Requests   Final    BOTTLES DRAWN AEROBIC AND ANAEROBIC Blood Culture adequate volume   Culture NO GROWTH 3 DAYS  Final   Report Status PENDING  Incomplete  MRSA PCR Screening     Status: None   Collection Time: 06/20/16  5:37 AM  Result Value Ref Range Status   MRSA by PCR NEGATIVE NEGATIVE Final     Comment:        The GeneXpert MRSA Assay (FDA approved for NASAL specimens only), is one component of a comprehensive MRSA colonization surveillance program. It is not intended to diagnose MRSA infection nor to guide or monitor treatment for MRSA infections.     Today   Subjective    Rhonna Holster today has no headache,no chest abdominal pain,no new weakness tingling or numbness, feels much better wants to go home today.     Objective   Blood pressure (!) 170/67, pulse 71, temperature 97.6 F (36.4 C), temperature source Oral, resp. rate 13, height '5\' 8"'$  (1.727 m), weight 57.9 kg (127 lb 10.3 oz), SpO2 98 %.   Intake/Output Summary (Last 24 hours) at 06/24/16 0813 Last data filed at 06/23/16 2327  Gross per 24 hour  Intake              940 ml  Output                0 ml  Net              940 ml    Exam Awake Alert, Oriented x 3, No new F.N deficits, Normal affect [place].AT,PERRAL Supple Neck,No JVD, No cervical lymphadenopathy appriciated.  Symmetrical Chest wall movement, Good air movement bilaterally, CTAB RRR,No Gallops,Rubs or new Murmurs, No Parasternal Heave +ve B.Sounds, Abd Soft, Non tender, No organomegaly appriciated, No rebound -guarding or rigidity. No Cyanosis, Clubbing or edema, No new Rash or bruise   Data Review   CBC w Diff: Lab Results  Component Value Date   WBC 5.0 06/24/2016   HGB 11.9 (L) 06/24/2016   HCT 37.1 06/24/2016   PLT 243 06/24/2016   LYMPHOPCT 33 06/20/2016   MONOPCT 9 06/20/2016   EOSPCT 2  06/20/2016   BASOPCT 0 06/20/2016    CMP: Lab Results  Component Value Date   NA 136 06/24/2016   K 4.8 06/24/2016   CL 98 (L) 06/24/2016   CO2 25 06/24/2016   BUN 13 06/24/2016   CREATININE 0.60 06/24/2016   PROT 7.0 06/24/2016   ALBUMIN 3.2 (L) 06/24/2016   BILITOT 0.3 06/24/2016   ALKPHOS 67 06/24/2016   AST 23 06/24/2016   ALT 12 (L) 06/24/2016  .   Total Time in preparing paper work, data evaluation and todays exam - 21  minutes  Lala Lund M.D on 06/24/2016 at 8:13 AM  [facility]   Office  [phone]

## 2016-06-25 LAB — CULTURE, BLOOD (ROUTINE X 2)
CULTURE: NO GROWTH
Culture: NO GROWTH
SPECIAL REQUESTS: ADEQUATE
SPECIAL REQUESTS: ADEQUATE

## 2016-06-26 NOTE — Progress Notes (Addendum)
Location/Histology of Brain Tumor: METASTATIC ADENOCARCINOMA, CONSISTENT WITH LUNG PRIMARY - MRI from 06/21/16 showed "2 cm mass centered at the anteromedial right temporal lobe present(series 5, image 10). Associated vasogenic edema within the adjacent right temporal lobe. Additional mass within the high posterior right frontal lobe measures approximately 19 mm (series 5, image 22). Localized vasogenic edema without significant mass effect. Probable additional small lesions slightly anteriorly (series 5, image 22). Additional vasogenic edema within the bilateral parietal regions also concerning for mass lesions (series 5, image 17, 16, 15). Vasogenic edema at the anterior left insular cortex also suspicious for small underlying lesion (series 6, image 13). Probable additional lesions within the parasagittal anterior left frontal lobe (series 6, image 20) as well as the high posterior left frontal lobe (series 6, image 21, 22).   Patient presented with symptoms of:  Patient was brought to the ER on 06/19/16 for a generalized tonic-clonic seizure. Patient was confused after the incident. Patient had been complaining of headache for last few months.  Past or anticipated interventions, if any, per neurosurgery: no  Past or anticipated interventions, if any, per medical oncology: chemotherapy  Dose of Decadron, if applicable: 4 mg BID  Recent neurologic symptoms, if any:   Seizures: yes  Headaches: yes - daily  Nausea: yes  Dizziness/ataxia: yes  Difficulty with hand coordination: no  Focal numbness/weakness: yes - she is weak  Visual deficits/changes: yes  Confusion/Memory deficits: yes   SAFETY ISSUES:  Prior radiation? no  Pacemaker/ICD? no  Possible current pregnancy? no  Is the patient on methotrexate? no  Additional Complaints / other details: Patient is here with her daughter.  Patient has a history of uterine cancer and is having vaginal bleeding.  She said it is from bladder  mesh.  BP 123/70 (BP Location: Left Arm, Patient Position: Sitting)   Pulse 72   Temp 98.1 F (36.7 C) (Oral)   Ht '5\' 8"'$  (1.727 m)   Wt 123 lb 9.6 oz (56.1 kg)   SpO2 97%   BMI 18.79 kg/m    Wt Readings from Last 3 Encounters:  06/27/16 123 lb 9.6 oz (56.1 kg)  06/24/16 127 lb 10.3 oz (57.9 kg)  02/18/15 114 lb 10.2 oz (52 kg)

## 2016-06-27 ENCOUNTER — Ambulatory Visit
Admission: RE | Admit: 2016-06-27 | Discharge: 2016-06-27 | Disposition: A | Discharge status: Home / Self Care | Payer: Medicaid Other | Source: Ambulatory Visit | Attending: Radiation Oncology | Admitting: Radiation Oncology

## 2016-06-27 ENCOUNTER — Encounter: Payer: Self-pay | Admitting: Radiation Oncology

## 2016-06-27 VITALS — BP 123/70 | HR 72 | Temp 98.1°F | Ht 68.0 in | Wt 123.6 lb

## 2016-06-27 DIAGNOSIS — F17210 Nicotine dependence, cigarettes, uncomplicated: Secondary | ICD-10-CM | POA: Diagnosis not present

## 2016-06-27 DIAGNOSIS — Z7982 Long term (current) use of aspirin: Secondary | ICD-10-CM | POA: Insufficient documentation

## 2016-06-27 DIAGNOSIS — C3490 Malignant neoplasm of unspecified part of unspecified bronchus or lung: Secondary | ICD-10-CM | POA: Insufficient documentation

## 2016-06-27 DIAGNOSIS — Z803 Family history of malignant neoplasm of breast: Secondary | ICD-10-CM | POA: Insufficient documentation

## 2016-06-27 DIAGNOSIS — Z808 Family history of malignant neoplasm of other organs or systems: Secondary | ICD-10-CM | POA: Diagnosis not present

## 2016-06-27 DIAGNOSIS — Z79899 Other long term (current) drug therapy: Secondary | ICD-10-CM | POA: Diagnosis not present

## 2016-06-27 DIAGNOSIS — G939 Disorder of brain, unspecified: Secondary | ICD-10-CM

## 2016-06-27 DIAGNOSIS — C7931 Secondary malignant neoplasm of brain: Secondary | ICD-10-CM | POA: Diagnosis not present

## 2016-06-27 DIAGNOSIS — Z510 Encounter for antineoplastic radiation therapy: Secondary | ICD-10-CM | POA: Diagnosis not present

## 2016-06-27 HISTORY — DX: Malignant neoplasm of unspecified part of unspecified bronchus or lung: C34.90

## 2016-06-27 MED ORDER — OXYCODONE HCL 10 MG PO TABS: 10.0000 mg | ORAL_TABLET | Freq: Every day | ORAL | 0 refills | Status: AC

## 2016-06-27 NOTE — Progress Notes (Signed)
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 female who is seen out of the courtesy of Dr. Burney Gauze for consideration for radiation therapy as part of management of patient's advanced non-small cell lung cancer. The patient recently presented to the emergency room after family members witnessed a general tonic-clonic seizure. The patient was confused after the incident. Respect Complained of some headaches over the past one to 2 months. She also admitted to approximately 25 pound weight loss with poor appetite. Patient was placed on combination of Keppra and Decadron.  no further seizure activity on the hospital. MRI the brain suboptimal due to movement. Shows multiple lesions consistent with brain metastasis. Patient did undergo a CT-guided biopsy of enlarged nodes in the lower lumbar spine/upper pelvis region. This is showing adenocarcinoma with special stains consistent with lung cancer primary. Patient is now seen in radiation oncology in consideration for radiation as part of management of her brain metastasis.Marland Kitchen  PREVIOUS RADIATION THERAPY: No  PAST MEDICAL HISTORY:  has a past medical history of Age-related osteoporosis without current pathological fracture; Anxiety; Aortic aneurysm, abdominal (HCC) (9 cm); Chronic obstructive pulmonary disease ([place]); Depression; Exposure of implanted vaginal mesh and prosthetic material in vagina [facility]); GERD (gastroesophageal reflux disease); Hypertension; Lung cancer ([place]); Nicotine dependence; Uterus cancer ([place]); Viral hepatitis C; and Vitamin D deficiency.    PAST SURGICAL HISTORY: Past  Surgical History:  Procedure Laterality Date  . ABDOMINAL HYSTERECTOMY    . BLADDER SURGERY    . CHOLECYSTECTOMY    . ESOPHAGOGASTRODUODENOSCOPY N/A 02/25/2015   Procedure: ESOPHAGOGASTRODUODENOSCOPY (EGD);  Surgeon: Carol Ada, MD;  Location: [facility] ENDOSCOPY;  Service: Endoscopy;  Laterality: N/A;  . NEUROPLASTY / TRANSPOSITION MEDIAN NERVE AT CARPAL TUNNEL    . RECONSTRUCTION OF NOSE    . TONSILLECTOMY    . TUBAL LIGATION    . TUMOR REMOVAL     Nose    FAMILY HISTORY: family history includes Breast cancer in her mother; Diabetes in her mother; Liver cancer in her brother.  SOCIAL HISTORY:  reports that she has been smoking Cigarettes.  She has a 23.50 pack-year smoking history. She has never used smokeless tobacco. She reports that she does not drink alcohol or use drugs.  ALLERGIES: Mucinex [guaifenesin er] and Nsaids  MEDICATIONS:  Current Outpatient Prescriptions  Medication Sig Dispense Refill  . ALPRAZolam (XANAX) 0.5 MG tablet Take 1 tablet (0.5 mg total) by mouth 2 (two) times daily as needed for anxiety. 15 tablet 0  . aspirin EC 81 MG tablet Take 81 mg by mouth every 6 (six) hours as needed for mild pain.    Marland Kitchen atorvastatin (LIPITOR) 40 MG tablet Take 1 tablet (40 mg total) by mouth daily at 6 PM.    . bisacodyl (DULCOLAX) 5 MG EC tablet Take 2 tablets (10 mg total) by mouth daily as needed for moderate constipation. 30 tablet 0  . dexamethasone (DECADRON) 4 MG tablet Take 1 tablet (4 mg total) by mouth 2 (two) times daily. Do not stop takinhg abrubtly, check with the cancer doctor before stopping this medicine 40 tablet 0  . docusate sodium (COLACE) 100 MG capsule Take  2 capsules (200 mg total) by mouth 2 (two) times daily. 30 capsule 0  . fenofibrate (TRICOR) 145 MG tablet Take 145 mg by mouth daily.    . ferrous sulfate 325 (65 FE) MG tablet Take 1 tablet (325 mg total) by mouth 2 (two) times daily with a meal. 90 tablet 3  . levalbuterol (XOPENEX) 0.63 MG/3ML nebulizer  solution Take 3 mLs (0.63 mg total) by nebulization every 2 (two) hours as needed for wheezing. 3 mL 2  . levETIRAcetam (KEPPRA) 500 MG tablet Take 1 tablet (500 mg total) by mouth 2 (two) times daily. 60 tablet 0  . megestrol (MEGACE) 400 MG/10ML suspension Take 400 mg by mouth daily.    . metoprolol tartrate (LOPRESSOR) 25 MG tablet Take 1 tablet (25 mg total) by mouth 2 (two) times daily. 60 tablet 0  . Oxycodone HCl 10 MG TABS Take 1 tablet (10 mg total) by mouth 6 (six) times daily. As needed for severe pain 45 tablet 0  . potassium chloride SA (K-DUR,KLOR-CON) 20 MEQ tablet Take 2 tablets (40 mEq total) by mouth 2 (two) times daily. 4 tablet 0  . tiotropium (SPIRIVA) 18 MCG inhalation capsule Place 1 capsule (18 mcg total) into inhaler and inhale daily. 30 capsule 2  . lidocaine (XYLOCAINE) 2 % jelly Apply 1 application topically 3 (three) times daily. (Patient not taking: Reported on 06/27/2016) 30 mL 0   No current facility-administered medications for this encounter.     REVIEW OF SYSTEMS: REVIEW OF SYSTEMS: A 10+ POINT REVIEW OF SYSTEMS WAS OBTAINED including neurology, dermatology, psychiatry, cardiac, respiratory, lymph, extremities, GI, GU, musculoskeletal, constitutional, reproductive, HEENT. All pertinent positives are noted in the HPI. All others are negative.   PHYSICAL EXAM:  height is '5\' 8"'$  (1.727 m) and weight is 123 lb 9.6 oz (56.1 kg). Her oral temperature is 98.1 F (36.7 C). Her blood pressure is 123/70 and her pulse is 72. Her oxygen saturation is 97%.   General: Alert and oriented, in no acute distress, patient ambulates with the assistance of a walker. She is unable to get up on the examination table by herself assisted by her husband for this issue. The husband seems to be very supportive of the patient and concerned about the level pain the patient is experiencing HEENT: Head is normocephalic. Extraocular movements are intact. Oropharynx is clear. Partial in place along  the mandible. The patient has no teeth remaining along the maxillary region. Neck: Neck is supple, no palpable cervical or supraclavicular lymphadenopathy. Heart: Regular in rate and rhythm with no murmurs, rubs, or gallops. Chest: Clear to auscultation bilaterally, with no rhonchi, wheezes, or rales. Abdomen: Soft, nontender, nondistended, with no rigidity or guarding. Extremities: No cyanosis or edema. Lymphatics: see Neck Exam Skin: No concerning lesions. Musculoskeletal: symmetric strength and muscle tone throughout. Neurologic: Cranial nerves II through XII are grossly intact. No obvious focalities. Speech is fluent. Coordination is intact. Patient has generalized weakness and has some difficulty going from a sitting to standing position. Psychiatric: Judgment and insight are intact. Somewhat of a depressed affect.      ECOG = 3  0 - Asymptomatic (Fully active, able to carry on all predisease activities without restriction)  1 - Symptomatic but completely ambulatory (Restricted in physically strenuous activity but ambulatory and able to carry out work of a light or sedentary nature. For example, light housework, office work)  2 - Symptomatic, <50% in bed during the day (Ambulatory and capable of all self care  but unable to carry out any work activities. Up and about more than 50% of waking hours)  3 - Symptomatic, >50% in bed, but not bedbound (Capable of only limited self-care, confined to bed or chair 50% or more of waking hours)  4 - Bedbound (Completely disabled. Cannot carry on any self-care. Totally confined to bed or chair)  5 - Death   Eustace Pen MM, Creech RH, Tormey DC, et al. [phone]). "Toxicity and response criteria of the [facility]". [place] Oncol. 5 (6): 649-55  LABORATORY DATA:  Lab Results  Component Value Date   WBC 5.0 06/24/2016   HGB 11.9 (L) 06/24/2016   HCT 37.1 06/24/2016   MCV 96.1 06/24/2016   PLT 243 06/24/2016   NEUTROABS  3.1 06/20/2016   Lab Results  Component Value Date   NA 136 06/24/2016   K 4.8 06/24/2016   CL 98 (L) 06/24/2016   CO2 25 06/24/2016   GLUCOSE 184 (H) 06/24/2016   CREATININE 0.60 06/24/2016   CALCIUM 9.3 06/24/2016      RADIOGRAPHY: Ct Head Wo Contrast  Result Date: 06/20/2016 CLINICAL DATA:  Seizure and confusion EXAM: CT HEAD WITHOUT CONTRAST TECHNIQUE: Contiguous axial images were obtained from the base of the skull through the vertex without intravenous contrast. COMPARISON:  06/17/2009 FINDINGS: Brain: Abnormal low-attenuation visualize within the right temporal lobe with additional foci of abnormal low-density in the right frontal and parietal white matter. Possible additional low-attenuation focus in the left posterior parietal white matter. No midline shift. The ventricles are nonenlarged. No definitive hemorrhage. Vascular: No hyperdense vessels.  Carotid artery calcifications. Skull: No fracture or suspicious bone lesion. Sinuses/Orbits: No acute finding. Other: None IMPRESSION: 1. Multifocal areas of abnormal low-density/edema involving the right temporal lobe, right frontal and parietal lobes, and possibly the left parietal lobe. Findings could be secondary to infection or tumor. Contrast-enhanced MRI is recommended for further evaluation. Electronically Signed   By: Donavan Foil M.D.   On: 06/20/2016 00:24   Mr Brain Wo Contrast  Result Date: 06/20/2016 CLINICAL DATA:  Initial evaluation for brain mass. EXAM: MRI HEAD WITHOUT CONTRAST TECHNIQUE: Multiplanar, multiecho pulse sequences of the brain and surrounding structures were obtained without intravenous contrast. COMPARISON:  Prior CT from 06/19/2016. FINDINGS: Brain: Examination is limited as the patient was unable to complete the exam. Cerebral volume within normal limits. No significant cerebral white matter disease. 2 cm mass centered at the anteromedial right temporal lobe present (series 5, image 10). Associated vasogenic  edema within the adjacent right temporal lobe. Additional mass within the high posterior right frontal lobe measures approximately 19 mm (series 5, image 22). Localized vasogenic edema without significant mass effect. Probable additional small lesions slightly anteriorly (series 5, image 22). Additional vasogenic edema within the bilateral parietal regions also concerning for mass lesions (series 5, image 17, 16, 15). Vasogenic edema at the anterior left insular cortex also suspicious for small underlying lesion (series 6, image 13). Probable additional lesions within the parasagittal anterior left frontal lobe (series 6, image 20) as well as the high posterior left frontal lobe (series 6, image 21, 22). No other definite lesions on this limited noncontrast examination. Findings highly concerning for metastatic disease. No abnormal foci of restricted diffusion to suggest acute or subacute ischemia. Gray-white matter differentiation otherwise maintained. Few scattered foci susceptibility artifact noted within the supratentorial brain, suspected related to metastatic disease, although chronic hemorrhages could also be considered. No evidence for acute intracranial hemorrhage on this limited  exam. No extra-axial fluid collection. Ventricles normal size without hydrocephalus. No midline shift. Basilar cisterns are patent. Incidental note made of an empty sella. Suprasellar region normal. Midline structures intact and normal. Major intracranial vascular flow voids are maintained. Skull and upper cervical spine: Craniocervical junction within normal limits. Visualized upper cervical spine unremarkable. Bone marrow signal intensity normal. No scalp soft tissue abnormality. Sinuses/Orbits: Globes and orbital soft tissues within normal limits. Paranasal sinuses are clear. S-shaped bowing of the nasal septum with bilateral concha bullosa noted. No mastoid effusion. Inner ear structures normal. Other: None. IMPRESSION: 1.  Limited study due to patient's inability to tolerate the full length of the exam. 2. Multifocal intracranial mass lesions as above, highly concerning for metastatic disease. A repeat study when the patient is able to tolerate the full length of the exam is recommended. Electronically Signed   By: Jeannine Boga M.D.   On: 06/20/2016 05:16   Nm Bone Scan Whole Body  Result Date: 06/22/2016 CLINICAL DATA:  Pulmonary lesions.  Concern for metastatic disease . EXAM: NUCLEAR MEDICINE WHOLE BODY BONE SCAN TECHNIQUE: Whole body anterior and posterior images were obtained approximately 3 hours after intravenous injection of radiopharmaceutical. RADIOPHARMACEUTICALS:  22.0 mCi Technetium-6mMDP IV COMPARISON:  CT 06/19/2016 and 07/16/2008.  Chest x-ray 02/17/2015. FINDINGS: Bilateral renal function excretion. Lumbar scoliosis. Lumbar MRI suggested for further evaluation. An adjacent lower areas of minimal increased activity lumbar spine are most likely degenerative, these changes can also be further evaluated with lumbar spine MRI. No other significant focal bony abnormalities identified. IMPRESSION: 1. Prominent increased activity noted over and upper lumbar vertebral body. Metastatic disease cannot be excluded. Further evaluation with gadolinium-enhanced lumbar spine MRI should be considered. 2. Multifocal mild areas of increased activity noted in the lumbar spine below the above-described lesion. These changes are most likely degenerative however they can be of further evaluated with MRI. No other focal significant bony abnormalities identified . Electronically Signed   By: TMarcello Moores Register   On: 06/22/2016 15:16   Ct Biopsy  Result Date: 06/21/2016 INDICATION: PELVIC ADENOPATHY CONCERNING FOR NEOPLASTIC PROCESS OR LYMPHOPROLIFERATIVE DISEASE. EXAM: CT-GUIDED RIGHT PELVIC ILIAC LYMPH NODE BIOPSY MEDICATIONS: 1% lidocaine locally ANESTHESIA/SEDATION: Moderate (conscious) sedation was employed during this  procedure. A total of Versed 2.0 mg and Fentanyl 100 mcg was administered intravenously. Moderate Sedation Time: 9 minutes. The patient's level of consciousness and vital signs were monitored continuously by radiology nursing throughout the procedure under my direct supervision. FLUOROSCOPY TIME:  Fluoroscopy Time: None. COMPLICATIONS: None immediate. PROCEDURE: Informed written consent was obtained from the patient after a thorough discussion of the procedural risks, benefits and alternatives. All questions were addressed. Maximal Sterile Barrier Technique was utilized including caps, mask, sterile gowns, sterile gloves, sterile drape, hand hygiene and skin antiseptic. A timeout was performed prior to the initiation of the procedure. Previous imaging reviewed. Patient positioned prone. Noncontrast localization CT performed. The right pelvic enlarged iliac lymph node was localized. Posterior approach was marked. Under sterile conditions and local anesthesia, a 17 gauge 11.8 cm access needle was advanced from a posterior paraspinous approach to the right pelvic iliac lymph node. Needle position confirmed with CT. 18 gauge core biopsies obtained. Samples were small and fragmented suggesting a benign process. Needle removed. Postprocedure imaging demonstrates a trace amount of surrounding hemorrhage related to the biopsy. No large hematoma. Patient tolerated the biopsy well. IMPRESSION: Successful CT-guided right pelvic iliac lymph node core biopsy. Electronically Signed   By: MJerilynn Mages  Shick M.D.  On: 06/21/2016 15:22   Ct Angio Chest/abd/pel For Dissection W And/or Wo Contrast  Result Date: 06/20/2016 CLINICAL DATA:  Seizure tonight at 20:30.  Confusion. EXAM: CT ANGIOGRAPHY CHEST, ABDOMEN AND PELVIS TECHNIQUE: Multidetector CT imaging through the chest, abdomen and pelvis was performed using the standard protocol during bolus administration of intravenous contrast. Multiplanar reconstructed images and MIPs were  obtained and reviewed to evaluate the vascular anatomy. CONTRAST:  100 mL Isovue 370 intravenous COMPARISON:  07/16/2008 FINDINGS: CTA CHEST FINDINGS Cardiovascular: Extensive soft and hard plaque throughout the thoracic aorta. No aneurysm or dissection. Pulmonary arteries are also well opacified, without embolism. Normal heart size. No pericardial effusion. Mediastinum/Nodes: No enlarged mediastinal or hilar nodes. Mild thyroid nodularity, not requiring additional workup. Lungs/Pleura: Centrilobular emphysematous disease, upper lobe predominant. Multiple irregular subcentimeter nodules are present, the largest measuring 8 mm in the left upper lobe on series 14, image 35. Central airways are patent and normal in caliber. No endobronchial lesions are evident. No pleural effusions. Musculoskeletal: No significant skeletal lesion. Review of the MIP images confirms the above findings. CTA ABDOMEN AND PELVIS FINDINGS VASCULAR Aorta: Extensive soft and hard plaque. Ulcerated plaque at the posterior wall on the left, just below the renal artery origins. 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 No evidence of acute dissection. Celiac: Independent origin of the common hepatic artery from the aorta, a normal variant. No aneurysm or dissection. SMA: Patent without evidence of aneurysm, dissection, vasculitis or significant stenosis. Renals: Accessory right renal artery to the lower pole. Calcified origins of the renal arteries with mild stenosis on the left and moderate stenosis on the right. No acute dissection or occlusion. IMA: Not visible.  Probable chronic occlusion. Inflow: Heavily calcified bilaterally. Left common iliac artery origin stenosis. Iliac bifurcations are heavily calcified bilaterally but patent. No acute dissection or occlusion. Veins: No obvious venous abnormality within the limitations of this arterial phase study. Review of the MIP images confirms the  above findings. NON-VASCULAR Hepatobiliary: No focal liver abnormality is seen. Status post cholecystectomy. No biliary dilatation. Pancreas: Unremarkable. No pancreatic ductal dilatation or surrounding inflammatory changes. Spleen: The spleen is enlarged and contains multiple low-attenuation masses. This is new from 07/16/2008. Cannot exclude lymphoma or other neoplastic process. Adrenals/Urinary Tract: Mildly thickened adrenals bilaterally without discrete mass. No significant renal parenchymal lesion. No urinary calculi. Ureters and urinary bladder are unremarkable. Stomach/Bowel: Stomach is within normal limits. Appendix is normal. No evidence of bowel wall thickening, distention, or inflammatory changes. Lymphatic: Multiple prominent nodes, predominantly in the para-aortic and common iliac regions, measuring up to 1.2 cm short axis. Reproductive: Status post hysterectomy. No adnexal masses. Other: No focal inflammation.  No ascites. Musculoskeletal: No significant skeletal lesion. Review of the MIP images confirms the above findings. IMPRESSION: 1. Negative for acute aortic dissection or occlusion 2. Extensive soft and hard plaque throughout the thoracic and abdominal aorta. Ulcerated plaque in the abdominal aorta just below the level of the renal arteries. 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 3. 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 Left common iliac origin stenosis. 4. Splenomegaly and multiple low-attenuation splenic masses, new from 07/16/2008. This could represent lymphoma or other neoplastic process. 5. Prominent para-aortic and common iliac nodes. Lymphoma or other neoplasm cannot be excluded. 6. Multiple subcentimeter pulmonary nodules bilaterally. At a minimum, follow-up CT in 3-6 months is recommended. More aggressive evaluation may be warranted, given the splenomegaly and abdominal adenopathy. Electronically Signed   By: Shaune Pascal  Alroy Dust M.D.   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  Patient would be a good candidate for whole brain radiation therapy. Given the number of lesions within the brain she would not be a candidate for stereotactic radiosurgery. Patient's performance status is suboptimal at this time. We discussed consideration for hospice with no active treatment for her brain metastasis. At this time the patient wishes to be aggressive with her therapy and proceed with whole brain radiation therapy. I discussed the side effects with treatment expected outcome and potential toxicities of whole brain radiation therapy with the patient her husband and daughter. They are all in agreement that she proceed with treatment. Patient complains of pain throughout the lower back area which may be related to her lymph node involvement in this region. Patient was given a limited prescription for her oxycodone upon discharge from the hospital. We will refill this medication today. She will obtain her other medications through her primary care physician who she will meet with later this week. Patient does have the physical therapy coming out of the house for an evaluation. I will also place a consult for home health. The patient is inquiring about a hospital bed.  PLAN: simulation 5/9 with treatments to begin 5/10.  I anticipate 10 sessions directed at the whole brain.      ------------------------------------------------  Blair Promise, PhD, MD

## 2016-06-27 NOTE — Progress Notes (Signed)
Please see the Nurse Progress Note in the MD Initial Consult Encounter for this patient. 

## 2016-06-28 ENCOUNTER — Encounter: Payer: Self-pay | Admitting: Hematology

## 2016-06-28 ENCOUNTER — Ambulatory Visit: Payer: Medicaid Other | Admitting: Radiation Oncology

## 2016-06-28 ENCOUNTER — Other Ambulatory Visit: Payer: Self-pay | Admitting: Oncology

## 2016-06-28 DIAGNOSIS — C3490 Malignant neoplasm of unspecified part of unspecified bronchus or lung: Secondary | ICD-10-CM

## 2016-06-28 DIAGNOSIS — C7931 Secondary malignant neoplasm of brain: Principal | ICD-10-CM

## 2016-06-29 ENCOUNTER — Ambulatory Visit: Admission: RE | Admit: 2016-06-29 | Payer: Medicaid Other | Source: Ambulatory Visit | Admitting: Radiation Oncology

## 2016-06-29 ENCOUNTER — Other Ambulatory Visit: Payer: Self-pay | Admitting: Oncology

## 2016-06-29 DIAGNOSIS — C7931 Secondary malignant neoplasm of brain: Principal | ICD-10-CM

## 2016-06-29 DIAGNOSIS — C3490 Malignant neoplasm of unspecified part of unspecified bronchus or lung: Secondary | ICD-10-CM

## 2016-06-30 ENCOUNTER — Ambulatory Visit
Admission: RE | Admit: 2016-06-30 | Discharge: 2016-06-30 | Disposition: A | Discharge status: Home / Self Care | Payer: Medicaid Other | Source: Ambulatory Visit | Attending: Radiation Oncology | Admitting: Radiation Oncology

## 2016-06-30 DIAGNOSIS — Z510 Encounter for antineoplastic radiation therapy: Secondary | ICD-10-CM | POA: Diagnosis not present

## 2016-06-30 MED ORDER — OXYCODONE HCL ER 15 MG PO T12A: 15.0000 mg | EXTENDED_RELEASE_TABLET | Freq: Two times a day (BID) | ORAL | 0 refills | Status: AC

## 2016-07-03 ENCOUNTER — Ambulatory Visit
Admission: RE | Admit: 2016-07-03 | Discharge: 2016-07-03 | Disposition: A | Discharge status: Home / Self Care | Payer: Medicaid Other | Source: Ambulatory Visit | Attending: Radiation Oncology | Admitting: Radiation Oncology

## 2016-07-03 DIAGNOSIS — Z510 Encounter for antineoplastic radiation therapy: Secondary | ICD-10-CM | POA: Diagnosis not present

## 2016-07-04 ENCOUNTER — Ambulatory Visit
Admission: RE | Admit: 2016-07-04 | Discharge: 2016-07-04 | Disposition: A | Discharge status: Home / Self Care | Payer: Medicaid Other | Source: Ambulatory Visit | Attending: Radiation Oncology | Admitting: Radiation Oncology

## 2016-07-04 ENCOUNTER — Other Ambulatory Visit: Payer: Self-pay | Admitting: Radiation Oncology

## 2016-07-04 DIAGNOSIS — C7931 Secondary malignant neoplasm of brain: Principal | ICD-10-CM

## 2016-07-04 DIAGNOSIS — C3490 Malignant neoplasm of unspecified part of unspecified bronchus or lung: Secondary | ICD-10-CM

## 2016-07-04 DIAGNOSIS — Z510 Encounter for antineoplastic radiation therapy: Secondary | ICD-10-CM | POA: Diagnosis not present

## 2016-07-04 MED ORDER — BIAFINE EX EMUL
Freq: Once | CUTANEOUS | Status: AC
  Administered 2016-07-04: 16:00:00 via TOPICAL

## 2016-07-04 MED ORDER — OXYCODONE HCL 15 MG PO TABS: 15.0000 mg | ORAL_TABLET | Freq: Four times a day (QID) | ORAL | 0 refills | Status: DC | PRN

## 2016-07-04 NOTE — Progress Notes (Signed)
Pt here for patient teaching.  Pt given Biafine and Radiation and You booklet.  Reviewed areas of pertinence such as fatigue, hair loss, nausea and vomiting, skin changes and headache . Pt able to give teach back of to pat skin and use unscented/gentle soap,Apply Biafine bid and avoid applying anything to skin within 4 hours of treatment. Pt demonstrated understanding and verbalizes understanding of information given and will contact nursing with any questions or concerns.

## 2016-07-05 ENCOUNTER — Ambulatory Visit: Payer: Medicaid Other | Admitting: Hematology

## 2016-07-05 ENCOUNTER — Ambulatory Visit: Payer: Medicaid Other

## 2016-07-06 ENCOUNTER — Ambulatory Visit
Admission: RE | Admit: 2016-07-06 | Discharge: 2016-07-06 | Disposition: A | Discharge status: Home / Self Care | Payer: Medicaid Other | Source: Ambulatory Visit | Attending: Radiation Oncology | Admitting: Radiation Oncology

## 2016-07-06 DIAGNOSIS — Z510 Encounter for antineoplastic radiation therapy: Secondary | ICD-10-CM | POA: Diagnosis not present

## 2016-07-07 ENCOUNTER — Ambulatory Visit
Admission: RE | Admit: 2016-07-07 | Discharge: 2016-07-07 | Disposition: A | Discharge status: Home / Self Care | Payer: Medicaid Other | Source: Ambulatory Visit | Attending: Radiation Oncology | Admitting: Radiation Oncology

## 2016-07-07 ENCOUNTER — Ambulatory Visit: Payer: Medicaid Other

## 2016-07-07 DIAGNOSIS — Z510 Encounter for antineoplastic radiation therapy: Secondary | ICD-10-CM | POA: Diagnosis not present

## 2016-07-10 ENCOUNTER — Ambulatory Visit: Payer: Medicaid Other | Admitting: Radiation Oncology

## 2016-07-10 ENCOUNTER — Ambulatory Visit
Admission: RE | Admit: 2016-07-10 | Discharge: 2016-07-10 | Disposition: A | Discharge status: Home / Self Care | Payer: Medicaid Other | Source: Ambulatory Visit | Attending: Radiation Oncology | Admitting: Radiation Oncology

## 2016-07-10 ENCOUNTER — Telehealth: Payer: Self-pay | Admitting: Oncology

## 2016-07-10 VITALS — BP 145/53 | HR 69 | Temp 98.0°F

## 2016-07-10 DIAGNOSIS — Z510 Encounter for antineoplastic radiation therapy: Secondary | ICD-10-CM | POA: Diagnosis not present

## 2016-07-10 DIAGNOSIS — C7931 Secondary malignant neoplasm of brain: Principal | ICD-10-CM

## 2016-07-10 DIAGNOSIS — C3490 Malignant neoplasm of unspecified part of unspecified bronchus or lung: Secondary | ICD-10-CM

## 2016-07-10 MED ORDER — OXYCODONE HCL 15 MG PO TABS: 15.0000 mg | ORAL_TABLET | Freq: Four times a day (QID) | ORAL | 0 refills | Status: DC | PRN

## 2016-07-10 NOTE — Telephone Encounter (Signed)
Stan Head, RN called from hospice and said that [name] "was mad over the weekend and threw her oxycodone tablets out the window."  She took Oxycontin only over the weekend and is now out of them as well.  She is calling on behalf of the patient to see if they can be refilled.  She is requesting that we call the patient to discuss this.

## 2016-07-10 NOTE — Telephone Encounter (Signed)
Called patient and talked with her husband.  He said [name] was mad and crying due to her pain on their back porch on Saturday.  She had thrown her oxycodone pills over the rail into the rain.  He said the Hospice nurse, Sherlyn Hay, had called the [facility] and let them know what happened.  He said she still has Oxycontin left and will bring them in when she comes for treatment today.  Advised them that they can see Dr. Sondra Come after her radiation treatment today to see if they can get her oxycodone refilled.

## 2016-07-10 NOTE — Progress Notes (Signed)
Patient has completed 5 fractions to her brain.  She reports having generalized pain at a 10/10 especially in her back and abdomen.  She said her abdomen hurts after eating.  She denies having a sore throat or trouble swallowing.  She threw her oxycodone tablets into the rain over the weekend.  She has been taking more of the Oxycontin tablets because of this.  She said she took 3 today.  She has 4 left.  She would like a refill of both.  Her daughter stated that they could be filled sooner if the strength of the oxycodone is increased.

## 2016-07-10 NOTE — Progress Notes (Signed)
  Radiation Oncology         [phone] ________________________________  Name: [name] MRN: [id number]  Date: 07/10/2016  DOB: 11-11-1955  Weekly Radiation Therapy Management    ICD-9-CM ICD-10-CM   1. 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 . . . . . . . The patient presents for under treatment assessment.  She has had a rough weekend. The patient was extremely upset late last week and threw her short acting pain medication out in the rain. She did not sleep well last night in light of her pain issues since she is out of her short acting medication. We spoke to the hospice nurse today and verified this  did happen. Patient understands the importance of not doing this again and keeping her medication safe place.The patient complains of abdominal pain but no nausea or gastritis symptoms. She is having regular bowel movements.                                                                  Set-up films were reviewed.                                 The chart was checked. Physical Findings. . .  oral temperature is 98 F (36.7 C). Her blood pressure is 145/53 (abnormal) and her pulse is 69. Her oxygen saturation is 100%. . Alert and cooperative. The patient has a depressed affect.  her husband and daughter on evaluation who both confirm that she did throw out her medications in the rain. Patient denies any suicidal follow-ups. Examination the oral cavity is somewhat dry. No secondary infection noted. The lungs are clear to auscultation. The heart has a regular rhythm and rate. The abdomen is soft and nontender with normal bowel sounds No rebound or guarding. The neurological examination is nonfocal. Impression . . . . . . . The patient is tolerating radiation. Plan . . . . . . . . . . . . Continue treatment as planned. I called  the patient's pharmacist [facility]) and explained the situation and they've graciously if agreed to fill the patient's medication. sHe will pick up the medication on the way home this evening.  Patient will come in early prior to her radiation treatment for blood work.  ________________________________   Blair Promise, PhD, MD

## 2016-07-11 ENCOUNTER — Ambulatory Visit: Payer: Medicaid Other

## 2016-07-11 ENCOUNTER — Encounter: Payer: Self-pay | Admitting: Radiation Oncology

## 2016-07-11 ENCOUNTER — Ambulatory Visit
Admission: RE | Admit: 2016-07-11 | Discharge: 2016-07-11 | Disposition: A | Discharge status: Home / Self Care | Payer: Medicaid Other | Source: Ambulatory Visit | Attending: Radiation Oncology | Admitting: Radiation Oncology

## 2016-07-11 VITALS — BP 117/69 | HR 63 | Temp 98.3°F | Ht 68.0 in | Wt 123.4 lb

## 2016-07-11 DIAGNOSIS — C3490 Malignant neoplasm of unspecified part of unspecified bronchus or lung: Secondary | ICD-10-CM

## 2016-07-11 DIAGNOSIS — Z510 Encounter for antineoplastic radiation therapy: Secondary | ICD-10-CM | POA: Diagnosis not present

## 2016-07-11 DIAGNOSIS — C7931 Secondary malignant neoplasm of brain: Principal | ICD-10-CM

## 2016-07-11 LAB — COMPREHENSIVE METABOLIC PANEL
ALT: 12 U/L (ref 0–55)
ANION GAP: 11 meq/L (ref 3–11)
AST: 11 U/L (ref 5–34)
Albumin: 3.9 g/dL (ref 3.5–5.0)
Alkaline Phosphatase: 57 U/L (ref 40–150)
BUN: 19.1 mg/dL (ref 7.0–26.0)
CHLORIDE: 103 meq/L (ref 98–109)
CO2: 24 meq/L (ref 22–29)
Calcium: 9.3 mg/dL (ref 8.4–10.4)
Creatinine: 0.9 mg/dL (ref 0.6–1.1)
EGFR: 67 mL/min/{1.73_m2} — AB (ref >90)
Glucose: 93 mg/dl (ref 70–140)
POTASSIUM: 4.5 meq/L (ref 3.5–5.1)
Sodium: 139 mEq/L (ref 136–145)
Total Bilirubin: 0.65 mg/dL (ref 0.20–1.20)
Total Protein: 7.4 g/dL (ref 6.4–8.3)

## 2016-07-11 LAB — CBC WITH DIFFERENTIAL/PLATELET
BASO%: 0.1 % (ref 0.0–2.0)
Basophils Absolute: 0 10*3/uL (ref 0.0–0.1)
EOS ABS: 0 10*3/uL (ref 0.0–0.5)
EOS%: 0.1 % (ref 0.0–7.0)
HCT: 43.7 % (ref 34.8–46.6)
HGB: 14.4 g/dL (ref 11.6–15.9)
LYMPH%: 3.5 % — AB (ref 14.0–49.7)
MCH: 32.3 pg (ref 25.1–34.0)
MCHC: 33 g/dL (ref 31.5–36.0)
MCV: 98 fL (ref 79.5–101.0)
MONO#: 0.6 10*3/uL (ref 0.1–0.9)
MONO%: 3.4 % (ref 0.0–14.0)
NEUT#: 16.9 10*3/uL — ABNORMAL HIGH (ref 1.5–6.5)
NEUT%: 92.9 % — AB (ref 38.4–76.8)
PLATELETS: 244 10*3/uL (ref 145–400)
RBC: 4.46 10*6/uL (ref 3.70–5.45)
RDW: 14.6 % — ABNORMAL HIGH (ref 11.2–14.5)
WBC: 18.2 10*3/uL — ABNORMAL HIGH (ref 3.9–10.3)
lymph#: 0.6 10*3/uL — ABNORMAL LOW (ref 0.9–3.3)

## 2016-07-11 NOTE — Progress Notes (Addendum)
Zahli had completed 6 fractions to her brain.  She said that hospice has prescribed Fentanyl for her at the lowest dose. They are not sure what needs to be approved to get the medication but think they may be waiting on lab work.  She reports her pain is OK today but says her stomach is hurting today.  She denies having any dizziness and is using a walker.  She reports her vision is blurred.  She is taking decadron 4 mg BID.  She reports having daily headaches.  She reports having fatigue.  The skin on her forehead and ears is intact.  She is using biafine as directed.  BP 117/69 (BP Location: Right Arm, Patient Position: Sitting)   Pulse 63   Temp 98.3 F (36.8 C) (Oral)   Ht '5\' 8"'$  (1.727 m)   Wt 123 lb 6.4 oz (56 kg)   SpO2 100%   BMI 18.76 kg/m    Wt Readings from Last 3 Encounters:  07/11/16 123 lb 6.4 oz (56 kg)  06/27/16 123 lb 9.6 oz (56.1 kg)  06/24/16 127 lb 10.3 oz (57.9 kg)

## 2016-07-11 NOTE — Progress Notes (Signed)
  Radiation Oncology         [phone] ________________________________  Name: MADASYN HEATH MRN: [id number]  Date: 07/11/2016  DOB: Jul 07, 1955  Weekly Radiation Therapy Management    ICD-9-CM ICD-10-CM   1. 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 . . . . . . . The patient presents for routine under treatment assessment.                                   The patient is Feeling a little better today. She continues to have her complains of pain in the stomach region  with eating. Bloodwork from earlier today shows no significant issues. Patient is being placed on a fentanyl patch by hospice. Patient feels a lot of her discomfort is bloating and I recommended she obtain a simethicone over-the-counter product to use with meals.                                 Set-up films were reviewed.                                 The chart was checked. Physical Findings. . .  height is '5\' 8"'$  (1.727 m) and weight is 123 lb 6.4 oz (56 kg). Her oral temperature is 98.3 F (36.8 C). Her blood pressure is 117/69 and her pulse is 63. Her oxygen saturation is 100%. . The lungs are clear. The heart has a regular rhythm and rate. The abdomen is soft and nontender with normal bowel sounds. The oral cavity is free of secondary infection. Impression . . . . . . . The patient is tolerating radiation. Plan . . . . . . . . . . . . Continue treatment as planned. She will need a Decadron taper next week.  ________________________________   Blair Promise, PhD, MD

## 2016-07-12 ENCOUNTER — Ambulatory Visit: Payer: Medicaid Other

## 2016-07-12 ENCOUNTER — Ambulatory Visit
Admission: RE | Admit: 2016-07-12 | Discharge: 2016-07-12 | Disposition: A | Discharge status: Home / Self Care | Payer: Medicaid Other | Source: Ambulatory Visit | Attending: Radiation Oncology | Admitting: Radiation Oncology

## 2016-07-12 ENCOUNTER — Telehealth: Payer: Self-pay | Admitting: Oncology

## 2016-07-12 DIAGNOSIS — Z510 Encounter for antineoplastic radiation therapy: Secondary | ICD-10-CM | POA: Diagnosis not present

## 2016-07-12 NOTE — Telephone Encounter (Signed)
Called Rubee's hospice nurse, Jocelyn Lamer, about the fentanyl patches she had mentioned during her visit yesterday.  Jocelyn Lamer said that Forestine has already picked them up from the pharmacy.

## 2016-07-13 ENCOUNTER — Ambulatory Visit: Payer: Medicaid Other

## 2016-07-13 ENCOUNTER — Telehealth: Payer: Self-pay

## 2016-07-13 NOTE — Telephone Encounter (Signed)
Pt called stating she will be at [facility] for XRT about 4 pm. Unable to call [facility] to inform them she is late.

## 2016-07-14 ENCOUNTER — Ambulatory Visit: Payer: Medicaid Other

## 2016-07-17 ENCOUNTER — Ambulatory Visit: Payer: Medicaid Other

## 2016-07-18 ENCOUNTER — Ambulatory Visit: Payer: Medicaid Other

## 2016-07-18 ENCOUNTER — Encounter: Payer: Self-pay | Admitting: Radiation Oncology

## 2016-07-18 ENCOUNTER — Ambulatory Visit
Admission: RE | Admit: 2016-07-18 | Discharge: 2016-07-18 | Disposition: A | Discharge status: Home / Self Care | Payer: Medicaid Other | Source: Ambulatory Visit | Attending: Radiation Oncology | Admitting: Radiation Oncology

## 2016-07-18 VITALS — BP 144/54 | HR 67 | Temp 97.9°F | Ht 68.0 in | Wt 130.0 lb

## 2016-07-18 DIAGNOSIS — C3490 Malignant neoplasm of unspecified part of unspecified bronchus or lung: Secondary | ICD-10-CM

## 2016-07-18 DIAGNOSIS — C7931 Secondary malignant neoplasm of brain: Principal | ICD-10-CM

## 2016-07-18 DIAGNOSIS — Z510 Encounter for antineoplastic radiation therapy: Secondary | ICD-10-CM | POA: Diagnosis not present

## 2016-07-18 NOTE — Progress Notes (Signed)
[name] has completed 8 fractions to her brain.  She reports having daily headaches and said her head is sore to the touch.  She continues to have pain in her abdomen.  She is taking oxycodone 15 mg 4 times a day and is also wearing a 25 mcg fentanyl patch.  She is also taking decadron 4 mg bid.  She continues to have trouble with her balance.  She is using a walker or cane.  The skin on her forehead and ears is intact.  She reports having hair loss this morning.  She denies having any nausea.  She reports having fatigue.  BP (!) 144/54 (BP Location: Right Arm, Patient Position: Sitting)   Pulse 67   Temp 97.9 F (36.6 C) (Oral)   Ht 5\' 8"  (1.727 m)   Wt 130 lb (59 kg)   SpO2 100%   BMI 19.77 kg/m    Wt Readings from Last 3 Encounters:  07/18/16 130 lb (59 kg)  07/11/16 123 lb 6.4 oz (56 kg)  06/27/16 123 lb 9.6 oz (56.1 kg)

## 2016-07-18 NOTE — Progress Notes (Signed)
  Radiation Oncology         [phone] ________________________________  Name: [name] MRN: [id number]  Date: 07/18/2016  DOB: 12-19-1955  Weekly Radiation Therapy Management    ICD-9-CM ICD-10-CM   1. Primary malignant neoplasm of lung with metastasis to brain [facility])  C34.90     C79.31      Current Dose: 24 Gy     Planned Dose:  30 Gy  Narrative . . . . . . . . The patient presents for routine under treatment assessment.                                  [name] has completed 8 fractions to her brain.  She reports having daily headaches and said her head is sore to the touch.  She continues to have pain in her abdomen.  She is taking oxycodone 15 mg 4 times a day and is also wearing a 25 mcg fentanyl patch.  She is also taking decadron 4 mg bid.  She continues to have trouble with her balance.  She is using a walker or cane.  The skin on her forehead and ears is intact.  She reports having hair loss this morning.  She denies having any nausea.  She reports having fatigue.                                  Set-up films were reviewed.                                 The chart was checked. Physical Findings. . .  height is 5\' 8"  (1.727 m) and weight is 130 lb (59 kg). Her oral temperature is 97.9 F (36.6 C). Her blood pressure is 144/54 (abnormal) and her pulse is 67. Her oxygen saturation is 100%. . The oral cavity is moist without secondary infection. The lungs are clear. The heart has regular rhythm and rate.  Impression . . . . . . . The patient is tolerating radiation. Plan . . . . . . . . . . . . Continue treatment as planned. Decadron taper planned at the completion of her brain radiation treatments. Hospice is coming out of the house to assist.  ________________________________   Blair Promise, PhD, MD

## 2016-07-19 ENCOUNTER — Ambulatory Visit
Admission: RE | Admit: 2016-07-19 | Discharge: 2016-07-19 | Disposition: A | Discharge status: Home / Self Care | Payer: Medicaid Other | Source: Ambulatory Visit | Attending: Radiation Oncology | Admitting: Radiation Oncology

## 2016-07-19 ENCOUNTER — Ambulatory Visit: Payer: Medicaid Other

## 2016-07-19 ENCOUNTER — Telehealth: Payer: Self-pay | Admitting: Oncology

## 2016-07-19 DIAGNOSIS — Z510 Encounter for antineoplastic radiation therapy: Secondary | ICD-10-CM | POA: Diagnosis not present

## 2016-07-19 NOTE — Telephone Encounter (Signed)
Patient's husband called and said hospice has threatened to cut off all of Ranyah's prescriptions for pain medication.  He said he talked to the Case Manager with hospice today and was told that they are going to be calling Dr. Sondra Come and the pharmacy to advise them not to fill oxycodone.  He said he is scared and is not sure what to do because the fentanyl patch and oxycodone is working for her.  He would like to discuss this with Dr. Sondra Come.  Advised him that he and Makinze can see Dr. Sondra Come after treatment today.

## 2016-07-20 ENCOUNTER — Encounter: Payer: Self-pay | Admitting: Radiation Oncology

## 2016-07-20 ENCOUNTER — Ambulatory Visit
Admission: RE | Admit: 2016-07-20 | Discharge: 2016-07-20 | Disposition: A | Discharge status: Home / Self Care | Payer: Medicaid Other | Source: Ambulatory Visit | Attending: Radiation Oncology | Admitting: Radiation Oncology

## 2016-07-20 ENCOUNTER — Ambulatory Visit: Payer: Medicaid Other

## 2016-07-20 VITALS — BP 119/75 | HR 74 | Temp 97.9°F | Ht 68.0 in | Wt 123.2 lb

## 2016-07-20 DIAGNOSIS — C7931 Secondary malignant neoplasm of brain: Principal | ICD-10-CM

## 2016-07-20 DIAGNOSIS — C3490 Malignant neoplasm of unspecified part of unspecified bronchus or lung: Secondary | ICD-10-CM

## 2016-07-20 DIAGNOSIS — Z510 Encounter for antineoplastic radiation therapy: Secondary | ICD-10-CM | POA: Diagnosis not present

## 2016-07-20 MED ORDER — OXYCODONE HCL 15 MG PO TABS: 15.0000 mg | ORAL_TABLET | Freq: Four times a day (QID) | ORAL | 0 refills | Status: AC | PRN

## 2016-07-20 NOTE — Progress Notes (Signed)
  Radiation Oncology         [phone] ________________________________  Name: [name] MRN: [id number]  Date: 07/20/2016  DOB: [birth year]  Weekly Radiation Therapy Management    ICD-9-CM ICD-10-CM   1. 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 . . . . . . . The patient presents for routine under treatment assessment.                                 [name] has completed treatment with 10 fractions to her brain.  She reports having generalized pain today at a 9/10.  She is using a fentanyl 25 mcg patch and is taking oxycodone 15 mg q 6 hours.  She is wondering if she can have the oxycodone q 4 hours so that she can take it earlier for breath through pain.  She reports feeling tired today.  She is also having abdominal pain after eating.  Her husband is requesting a GI consult.  She was loosing her hair and shaved her head this morning.  The skin on her scalp is intact.  She is using biafine.  She is taking decadron 4 mg bid.  She continue to have daily headaches.  She denies feeling balance issues today.                                   Set-up films were reviewed.                                 The chart was checked. Physical Findings. . .  height is 5\' 8"  (1.727 m) and weight is 123 lb 3.2 oz (55.9 kg). Her oral temperature is 97.9 F (36.6 C). Her blood pressure is 119/75 and her pulse is 74. Her oxygen saturation is 100%. . The oral cavity is moist without secondary infection. The lungs are clear. The heart has regular rhythm and rate.  Some erythema to the scalp and hyperpigmentation changes. No moist desquamation Impression . . . . . . . The patient is tolerating radiation. Plan . . . . . . . . . . . . The patient has completed her therapy. Hospice is coming out of the house to assist. Decadron taper instructions given to the patient and family. I did refill the patient's  oxycodone. I did speak with Dr. Clemmie Krill the patient's hospice doctor who okayed this oxycodone prescription. Dr. Clemmie Krill is also increased the patient's Duragesic patch to help with pain issues. Patient will follow-up in approximately 4 weeks.  ________________________________   Blair Promise, PhD, MD This document serves as a record of services personally performed by Gery Pray, MD. It was created on his behalf by Bethann Humble, a trained medical scribe. The creation of this record is based on the scribe's personal observations and the provider's statements to them. This document has been checked and approved by the attending provider.

## 2016-07-20 NOTE — Progress Notes (Signed)
[name] has completed treatment with 10 fractions to her brain.  She reports having generalized pain today at a 9/10.  She is using a fentanyl 25 mcg patch and is taking oxycodone 15 mg q 6 hours.  She is wondering if she can have the oxycodone q 4 hours so that she can take it earlier for breath through pain.  She reports feeling tired today.  She is also having abdominal pain after eating.  Her husband is requesting a GI consult.  She was loosing her hair and shaved her head this morning.  The skin on her scalp is intact.  She is using biafine.  She is taking decadron 4 mg bid.  She continue to have daily headaches.  She denies feeling balance issues today.  BP 119/75 (BP Location: Left Arm, Patient Position: Sitting)   Pulse 74   Temp 97.9 F (36.6 C) (Oral)   Ht 5\' 8"  (1.727 m)   Wt 123 lb 3.2 oz (55.9 kg)   SpO2 100%   BMI 18.73 kg/m    Wt Readings from Last 3 Encounters:  07/20/16 123 lb 3.2 oz (55.9 kg)  07/18/16 130 lb (59 kg)  07/11/16 123 lb 6.4 oz (56 kg)

## 2016-07-20 NOTE — Progress Notes (Signed)
Decadron Taper Instructions:  Starting tomorrow, 07/21/16, take:  4 mg daily for 7 days (07/21/16 - 07/27/16)  Then take 2 mg (1/2 tablet) for 7 days (07/28/16-08/03/16)  Then take 2 mg (1/2 tablet) every other day for 7 days (08/04/16 - 08/10/16)  Then stop taking.

## 2016-07-21 ENCOUNTER — Ambulatory Visit: Payer: Medicaid Other

## 2016-07-21 ENCOUNTER — Encounter (HOSPITAL_COMMUNITY): Payer: Self-pay

## 2016-07-24 ENCOUNTER — Ambulatory Visit: Admission: RE | Admit: 2016-07-24 | Payer: Medicaid Other | Source: Ambulatory Visit

## 2016-07-24 NOTE — Progress Notes (Signed)
  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  The patient was immobilized with a thermoplastic mask and isocenter was verified with weekly port films.  Narrative: The patient tolerated radiation treatment relatively well. The patient had generalized pain for which she used fentanyl and oxycodone. She had daily headaches. The patient was on decadron. She had no secondary infection of the oral cavity. The patient has some erythema to the scalp and hyperpigmentation changes with no moist desquamation. Hospice would come out to her house to assist.  Plan: The patient has completed radiation treatment. The patient will return to radiation oncology clinic for routine followup in one month. I advised them to call or return sooner if they have any questions or concerns related to their recovery or treatment. The patient was instructed to begin Decadron taper. ________________________________ -----------------------------------  Blair Promise, PhD, MD  This document serves as a record of services personally performed by Gery Pray, MD. It was created on his behalf by Darcus Austin, a trained medical scribe. The creation of this record is based on the scribe's personal observations and the provider's statements to them. This document has been checked and approved by the attending provider.

## 2016-07-26 ENCOUNTER — Telehealth: Payer: Self-pay | Admitting: *Deleted

## 2016-07-26 NOTE — Telephone Encounter (Signed)
CALLED PATIENT TO INFORM OF FU ON 08-24-16 @ 11:15 AM WITH DR. Sondra Come, SPOKE WITH PATIENT'S DAUGHTER- ANGIE BISHOP AND SHE IS AWARE OF THIS APPT.

## 2016-07-28 ENCOUNTER — Encounter: Payer: Self-pay | Admitting: *Deleted

## 2016-07-28 NOTE — Progress Notes (Signed)
[facility] Psychosocial Distress Screening Clinical Social Work  Clinical Social Work was referred by distress screening protocol.  The patient scored a 10 on the Psychosocial Distress Thermometer which indicates severe distress. Clinical Social Worker reviewed chart to assess for distress and other psychosocial needs. CSW made several attempts to contact pt via phone, but main number provided in EPIC does not have voice mail set up. CSW also attempted to contact family member on the additional number and they encouraged to call number without voicemail. CSW will continue to be available as needed for pt concerns.   ONCBCN DISTRESS SCREENING 06/27/2016  Screening Type Initial Screening  Distress experienced in past week (1-10) 10  Emotional problem type Depression;Nervousness/Anxiety;Adjusting to illness;Isolation/feeling alone;Feeling hopeless;Adjusting to appearance changes  Spiritual/Religous concerns type Loss of sense of purpose  Information Concerns Type Lack of info about diagnosis;Lack of info about treatment;Lack of info about complementary therapy choices  Physical Problem type Pain;Nausea/vomiting;Sleep/insomnia;Bathing/dressing;Breathing;Loss of appetitie;Changes in urination;Skin dry/itchy;Swollen arms/legs    Clinical Social Worker follow up needed: Yes.    If yes, follow up plan:  See above Loren Racer, LCSW, OSW-C Clinical Social Worker [place]  [facility] Phone: [phone] Fax: [phone]

## 2016-08-07 ENCOUNTER — Telehealth: Payer: Self-pay | Admitting: Radiation Oncology

## 2016-08-07 NOTE — Telephone Encounter (Addendum)
Forwarded XRT note Mohamed.

## 2016-08-07 NOTE — Telephone Encounter (Signed)
Phoned patient's home to determine if she still wanted the treatment mask she left here. Spoke with patient's daughter, [name]. Angie confirms they do want the mask. Angie reports her mother is under hospice care. She request a call back from Dr. Clabe Seal nurse and Dr. Worthy Flank nurse. She questions if there are anymore possible treatments her mother may qualify for.

## 2016-08-10 ENCOUNTER — Telehealth: Payer: Self-pay | Admitting: Oncology

## 2016-08-10 NOTE — Telephone Encounter (Signed)
Claiborne Billings from [facility] and [place] called and asked if there are plans for further treatment for patient.  The patient is telling her that she would like chemotherapy if possible and thinks she is scheduled for a CT scan.  Claiborne Billings said she cannot continue with hospice if there are plans for further treatment.  Advised her that Dr. Sondra Come said the patient would like to meet with medical oncology to find out about treatment options but does not think she would be a candidate.  He would like her to remain on hospice if possible.  Advised I would check with Dr. Worthy Flank nurse and call her if patient will be seen by Dr. Julien Nordmann.  Claiborne Billings verbalized understanding and agreement.

## 2016-08-22 ENCOUNTER — Encounter: Payer: Self-pay | Admitting: Oncology

## 2016-08-24 ENCOUNTER — Encounter: Payer: Self-pay | Admitting: *Deleted

## 2016-08-24 ENCOUNTER — Ambulatory Visit
Admission: RE | Admit: 2016-08-24 | Discharge: 2016-08-24 | Disposition: A | Discharge status: Home / Self Care | Payer: Medicaid Other | Source: Ambulatory Visit | Attending: Radiation Oncology | Admitting: Radiation Oncology

## 2016-08-24 NOTE — Progress Notes (Signed)
[name] 61 y.o. 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  PAIN:  She is currently  Taking Oxycodone and using Fentanyl patch for pain control NEURO:Alert and oriented x 3 with fluent speech,able to complete sentences without difficulty with word finding or organization of sentences. Reports,denies any visual disturbance,blurred vision,double vision,blind spots or peripheral vision changes, Denies reports any nausea or vomiting,ataxia,ring of ears,dizziness or headache Fine motor movement-picking up objects with fingers,holding objects, writing, weakness of lower extremities:   Aphasia/Slurred speech: SKIN: Imaging:N/A Lab:N/A  OTHER: Pt complains of

## 2016-08-24 NOTE — Progress Notes (Signed)
31 Spoke with husband and he reported his wife is receiving in home hospice care and the insurance will not pay for the follow up appointment in Radiation oncology so they needed to cancel the appointment.  I let him know I would let Dr. Sondra Come know she is receiving in home hospice care.

## 2016-09-01 NOTE — Telephone Encounter (Signed)
I spoke to Angie and directed her to contact Dr Marin Olp for her concerns.

## 2019-03-25 IMAGING — MR MR HEAD W/O CM
7 of 8 series · 37 of 48 positions shown · non-contrast
Comparison: Prior CT from 06/19/2016.

CLINICAL DATA: Initial evaluation for brain mass.

EXAM:
MRI HEAD WITHOUT CONTRAST
TECHNIQUE: Multiplanar, multiecho pulse sequences of the brain and surrounding
structures were obtained without intravenous contrast.

[Series 2: FLAIR · sagittal · 5.0mm · 0.47mm/px · 3 of 23 slices shown (1 of 2)]
[im 1/23]
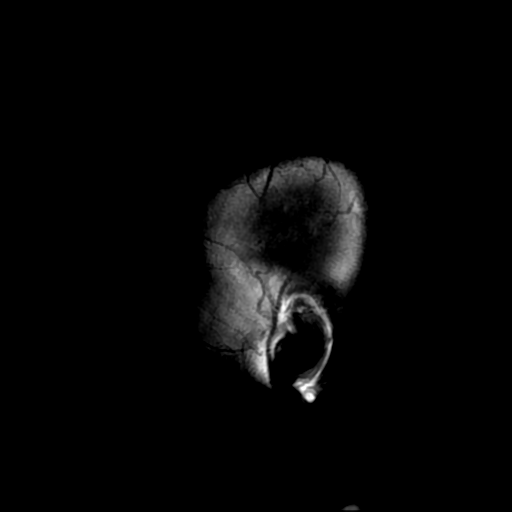
[im 12/23]
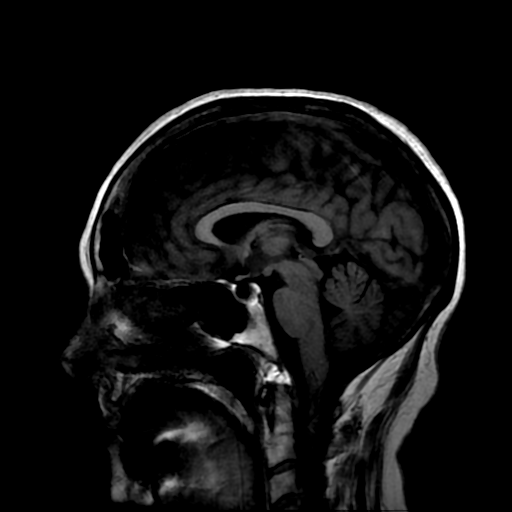
[im 23/23]
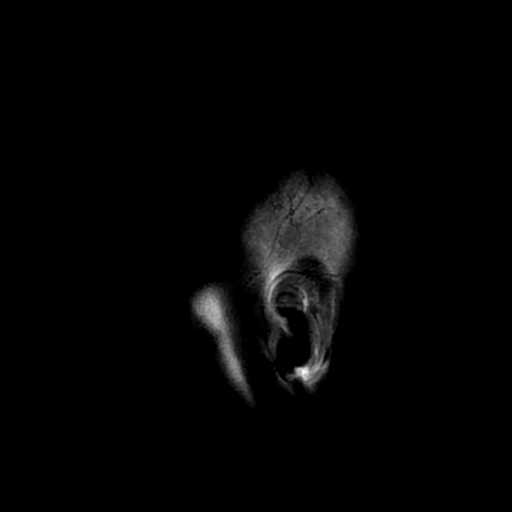

[Series 4: DWI · axial · 3.0mm · 0.94mm/px · z∈[-61,+82]mm · 11 of 98 slices shown (1 of 2)]
[im 1/98]
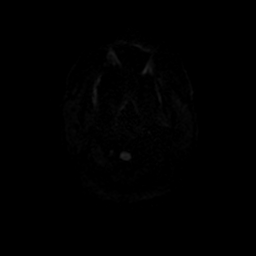
[im 10/98]
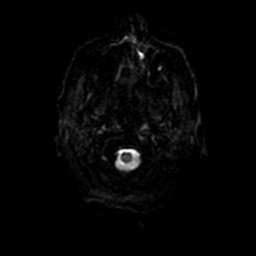
[im 20/98]
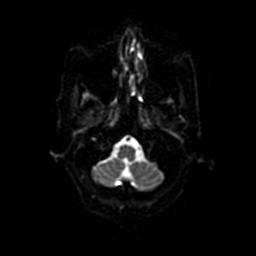
[im 30/98]
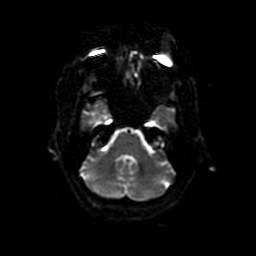
[im 39/98]
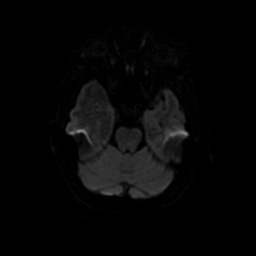
[im 49/98]
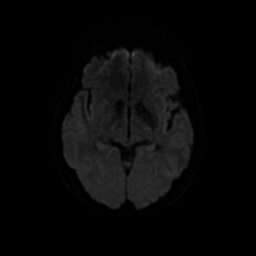
[im 59/98]
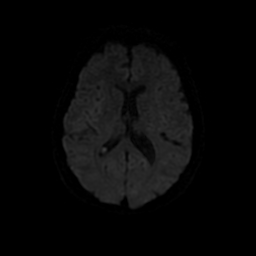
[im 68/98]
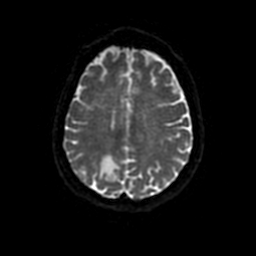
[im 78/98]
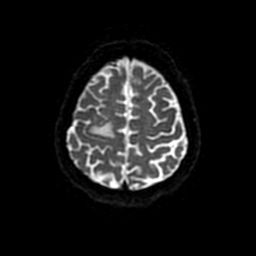
[im 88/98]
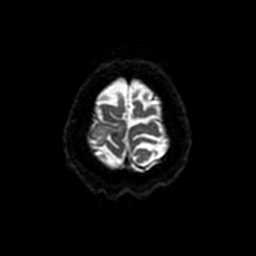
[im 98/98]
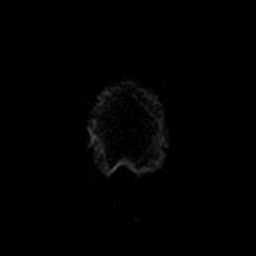

[Series 5: T2 · axial · 5.0mm · 0.43mm/px · z∈[-62,+81]mm · 3 of 25 slices shown]
[im 1/25]
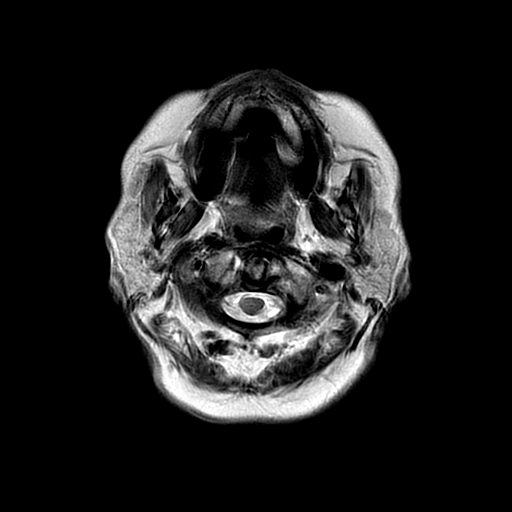
[im 13/25]
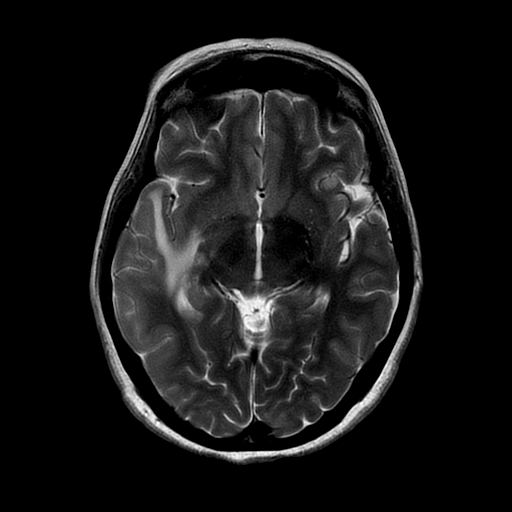
[im 25/25]
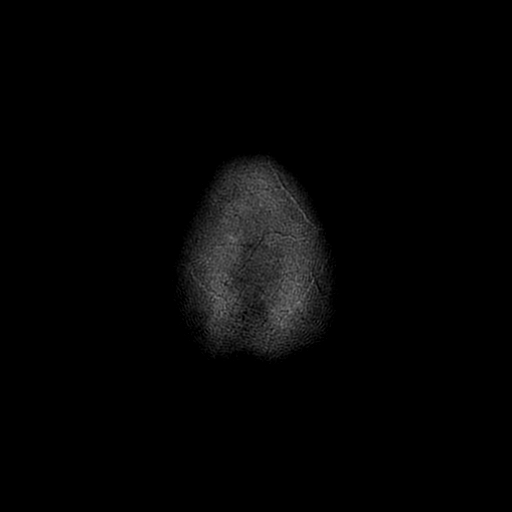

[Series 6: FLAIR · axial · 5.0mm · 0.43mm/px · z∈[-62,+81]mm · 3 of 25 slices shown (2 of 2)]
[im 1/25]
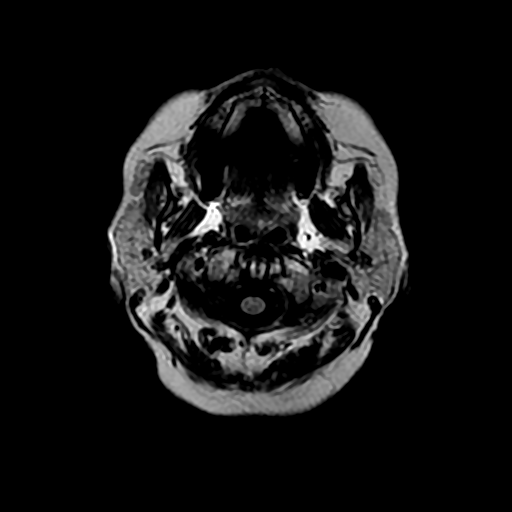
[im 13/25]
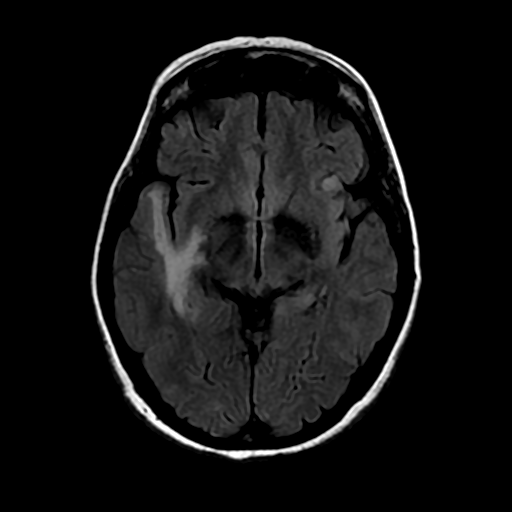
[im 25/25]
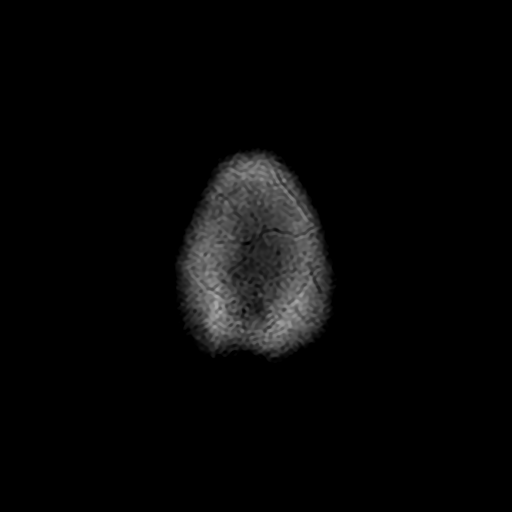

[Series 7: DWI · coronal · 4.0mm · 0.94mm/px · 8 of 68 slices shown (2 of 2)]
[im 1/68]
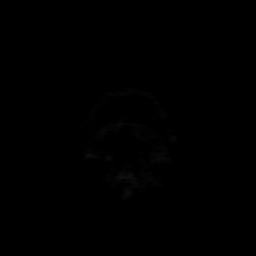
[im 10/68]
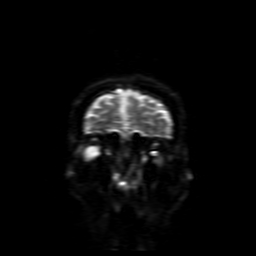
[im 20/68]
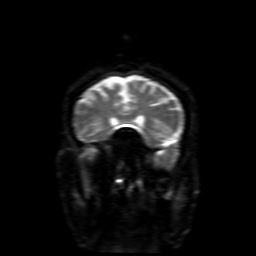
[im 29/68]
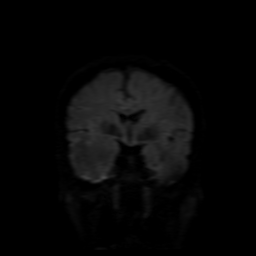
[im 39/68]
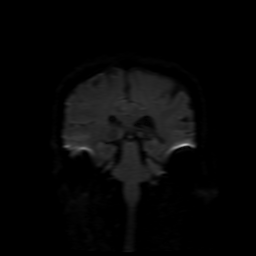
[im 48/68]
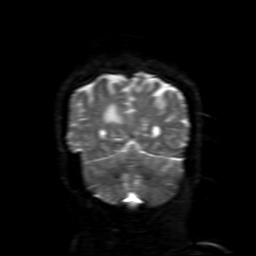
[im 58/68]
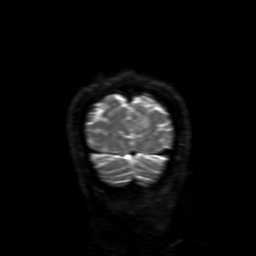
[im 68/68]
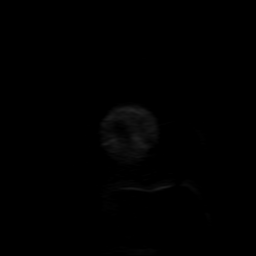

[Series 450: ADC · axial · 3.0mm · 0.94mm/px · z∈[-61,+82]mm · 5 of 47 slices shown (1 of 2)]
[im 1/47]
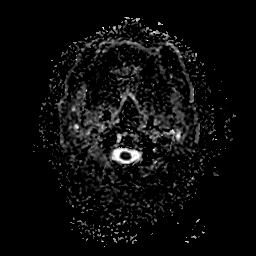
[im 12/47]
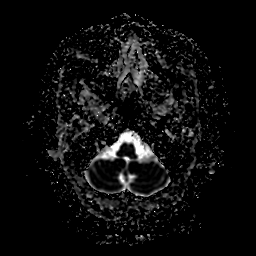
[im 24/47]
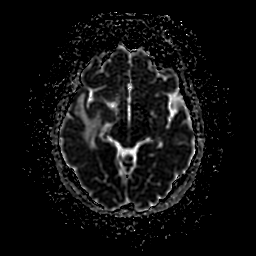
[im 35/47]
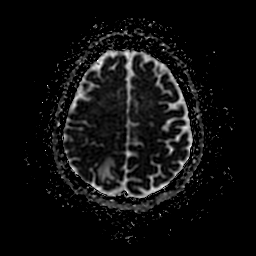
[im 47/47]
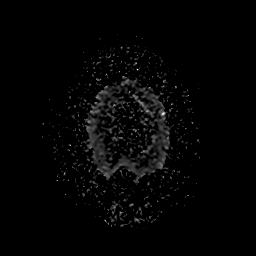

[Series 750: ADC · coronal · 4.0mm · 0.94mm/px · 4 of 34 slices shown (2 of 2)]
[im 1/34]
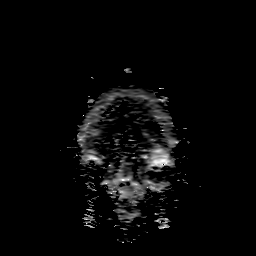
[im 12/34]
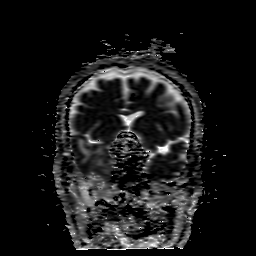
[im 23/34]
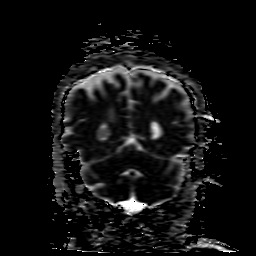
[im 34/34]
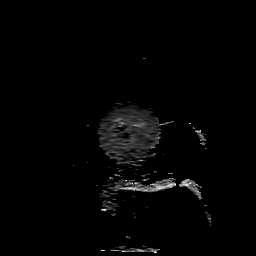

[37 of 48 positions shown; findings below may reference images not displayed]

FINDINGS: Brain: Examination is limited as the patient was unable to complete
the exam.

Cerebral volume within normal limits. No significant cerebral white
matter disease.

2 cm mass centered at the anteromedial right temporal lobe present
(series 5, image 10). Associated vasogenic edema within the adjacent
right temporal lobe. Additional mass within the high posterior right
frontal lobe measures approximately 19 mm (series 5, image 22).
Localized vasogenic edema without significant mass effect. Probable
additional small lesions slightly anteriorly (series 5, image 22).
Additional vasogenic edema within the bilateral parietal regions
also concerning for mass lesions (series 5, image 17, 16, 15).
Vasogenic edema at the anterior left insular cortex also suspicious
for small underlying lesion (series 6, image 13). Probable
additional lesions within the parasagittal anterior left frontal
lobe (series 6, image 20) as well as the high posterior left frontal
lobe (series 6, image 21, 22). No other definite lesions on this
limited noncontrast examination. Findings highly concerning for
metastatic disease.

No abnormal foci of restricted diffusion to suggest acute or
subacute ischemia. Gray-white matter differentiation otherwise
maintained. Few scattered foci susceptibility artifact noted within
the supratentorial brain, suspected related to metastatic disease,
although chronic hemorrhages could also be considered. No evidence
for acute intracranial hemorrhage on this limited exam.

No extra-axial fluid collection. Ventricles normal size without
hydrocephalus. No midline shift. Basilar cisterns are patent.

Incidental note made of an empty sella. Suprasellar region normal.
Midline structures intact and normal.

Major intracranial vascular flow voids are maintained.

Skull and upper cervical spine: Craniocervical junction within
normal limits. Visualized upper cervical spine unremarkable. Bone
marrow signal intensity normal. No scalp soft tissue abnormality.

Sinuses/Orbits: Globes and orbital soft tissues within normal
limits. Paranasal sinuses are clear. S-shaped bowing of the nasal
septum with bilateral concha bullosa noted. No mastoid effusion.
Inner ear structures normal.

Other: None.
IMPRESSION: 1. Limited study due to patient's inability to tolerate the full
length of the exam.
2. Multifocal intracranial mass lesions as above, highly concerning
for metastatic disease. A repeat study when the patient is able to
tolerate the full length of the exam is recommended.
